# Patient Record
Sex: Female | Born: 1965 | Race: White | Hispanic: No | Marital: Married | Smoking: Never smoker
Health system: Southern US, Community
[De-identification: ages and names within clinical notes are randomized; demographics above are authoritative.]

## PROBLEM LIST (undated history)

## (undated) DIAGNOSIS — Z85828 Personal history of other malignant neoplasm of skin: Secondary | ICD-10-CM

## (undated) DIAGNOSIS — Z87442 Personal history of urinary calculi: Secondary | ICD-10-CM

## (undated) DIAGNOSIS — R011 Cardiac murmur, unspecified: Secondary | ICD-10-CM

## (undated) DIAGNOSIS — C50112 Malignant neoplasm of central portion of left female breast: Secondary | ICD-10-CM

## (undated) DIAGNOSIS — R112 Nausea with vomiting, unspecified: Secondary | ICD-10-CM

## (undated) DIAGNOSIS — B009 Herpesviral infection, unspecified: Secondary | ICD-10-CM

## (undated) DIAGNOSIS — M549 Dorsalgia, unspecified: Secondary | ICD-10-CM

## (undated) DIAGNOSIS — T4145XA Adverse effect of unspecified anesthetic, initial encounter: Secondary | ICD-10-CM

## (undated) DIAGNOSIS — Z923 Personal history of irradiation: Secondary | ICD-10-CM

## (undated) DIAGNOSIS — R109 Unspecified abdominal pain: Secondary | ICD-10-CM

## (undated) DIAGNOSIS — Z9889 Other specified postprocedural states: Secondary | ICD-10-CM

## (undated) DIAGNOSIS — B079 Viral wart, unspecified: Secondary | ICD-10-CM

## (undated) DIAGNOSIS — T8859XA Other complications of anesthesia, initial encounter: Secondary | ICD-10-CM

## (undated) HISTORY — PX: WISDOM TOOTH EXTRACTION: SHX21

## (undated) HISTORY — DX: Viral wart, unspecified: B07.9

## (undated) HISTORY — DX: Malignant neoplasm of central portion of left female breast: C50.112

## (undated) HISTORY — PX: TONSILLECTOMY AND ADENOIDECTOMY: SHX28

## (undated) HISTORY — DX: Herpesviral infection, unspecified: B00.9

## (undated) HISTORY — PX: DILATION AND CURETTAGE OF UTERUS: SHX78

## (undated) HISTORY — DX: Dorsalgia, unspecified: M54.9

## (undated) HISTORY — PX: BREAST LUMPECTOMY: SHX2

## (undated) HISTORY — DX: Unspecified abdominal pain: R10.9

## (undated) HISTORY — DX: Personal history of other malignant neoplasm of skin: Z85.828

---
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
## 2002-12-29 ENCOUNTER — Other Ambulatory Visit: Admission: RE | Admit: 2002-12-29 | Discharge: 2002-12-29 | Payer: Self-pay | Admitting: Obstetrics & Gynecology

## 2003-02-06 ENCOUNTER — Encounter: Payer: Self-pay | Admitting: Obstetrics & Gynecology

## 2003-02-06 ENCOUNTER — Encounter: Admission: RE | Admit: 2003-02-06 | Discharge: 2003-02-06 | Payer: Self-pay | Admitting: Obstetrics & Gynecology

## 2004-02-12 ENCOUNTER — Other Ambulatory Visit: Admission: RE | Admit: 2004-02-12 | Discharge: 2004-02-12 | Payer: Self-pay | Admitting: Obstetrics & Gynecology

## 2004-04-02 ENCOUNTER — Encounter: Admission: RE | Admit: 2004-04-02 | Discharge: 2004-04-02 | Payer: Self-pay | Admitting: Obstetrics & Gynecology

## 2005-07-16 ENCOUNTER — Encounter: Admission: RE | Admit: 2005-07-16 | Discharge: 2005-07-16 | Payer: Self-pay | Admitting: Obstetrics & Gynecology

## 2006-07-21 ENCOUNTER — Encounter: Admission: RE | Admit: 2006-07-21 | Discharge: 2006-07-21 | Payer: Self-pay | Admitting: Obstetrics & Gynecology

## 2006-08-18 ENCOUNTER — Ambulatory Visit: Payer: Self-pay | Admitting: Family Medicine

## 2007-11-23 ENCOUNTER — Encounter: Admission: RE | Admit: 2007-11-23 | Discharge: 2007-11-23 | Payer: Self-pay | Admitting: Obstetrics & Gynecology

## 2008-04-05 ENCOUNTER — Ambulatory Visit: Payer: Self-pay | Admitting: Family Medicine

## 2008-05-24 ENCOUNTER — Ambulatory Visit: Payer: Self-pay | Admitting: Family Medicine

## 2009-06-24 ENCOUNTER — Ambulatory Visit: Payer: Self-pay | Admitting: Family Medicine

## 2009-06-24 DIAGNOSIS — B079 Viral wart, unspecified: Secondary | ICD-10-CM | POA: Insufficient documentation

## 2010-05-27 ENCOUNTER — Ambulatory Visit: Payer: Self-pay | Admitting: Obstetrics & Gynecology

## 2010-06-10 ENCOUNTER — Ambulatory Visit: Payer: Self-pay | Admitting: Family Medicine

## 2010-06-10 DIAGNOSIS — J040 Acute laryngitis: Secondary | ICD-10-CM | POA: Insufficient documentation

## 2010-06-10 DIAGNOSIS — J209 Acute bronchitis, unspecified: Secondary | ICD-10-CM

## 2010-07-01 ENCOUNTER — Ambulatory Visit: Payer: Self-pay | Admitting: Family Medicine

## 2010-07-01 DIAGNOSIS — B009 Herpesviral infection, unspecified: Secondary | ICD-10-CM | POA: Insufficient documentation

## 2010-07-01 DIAGNOSIS — J020 Streptococcal pharyngitis: Secondary | ICD-10-CM

## 2010-07-02 ENCOUNTER — Encounter: Payer: Self-pay | Admitting: Family Medicine

## 2010-07-03 LAB — CONVERTED CEMR LAB
Chlamydia, DNA Probe: NEGATIVE
GC Probe Amp, Genital: NEGATIVE

## 2010-11-25 NOTE — Assessment & Plan Note (Signed)
Summary: vaginal rash/alc acute only   Vital Signs:  Patient profile:   45 year old female Height:      67 inches Weight:      127.25 pounds BMI:     20.00 Temp:     98.2 degrees F oral Pulse rate:   72 / minute Pulse rhythm:   regular BP sitting:   116 / 82  (left arm) Cuff size:   regular  Vitals Entered By: Lewanda Rife LPN (July 01, 2010 3:15 PM) CC: perineal rash ? blisters   History of Present Illness: was on vacation -- had a really sore throat -- and was dx with strep on sunday  put on steroid and abx   also developed rash on her bottom at the same time (before abx and prednisone)  was told it vaguely looked bacterial but was not sure  did not give anything for it  now is worse-- is now blistery   no known exp to herpes  does have a new sexual partner -- he has no hx of herpes  feels like it is all on the outside   has been really stressed out lately    throat is better -- (was dx with quick test )   Allergies (verified): No Known Drug Allergies  Past History:  Social History: Last updated: 06/24/2009 is a Psychologist, 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 Eyes:  Denies blurring and eye irritation. CV:  Denies chest pain or discomfort and palpitations. Resp:  Denies cough and wheezing. GI:  Denies indigestion. GU:  Denies discharge, dysuria, and urinary frequency. MS:  Denies joint pain, joint redness, and joint swelling. Derm:  Denies itching, lesion(s), poor wound healing, and rash. Neuro:  Denies numbness and tingling. Psych:  some stress. Endo:  Denies excessive thirst and excessive urination. Heme:  Denies abnormal bruising and bleeding.  Physical Exam  General:  Well-developed,well-nourished,in no acute distress; alert,appropriate and cooperative throughout examination Head:   normocephalic, atraumatic, and no abnormalities observed.   Eyes:  vision grossly intact, pupils equal, pupils round, pupils reactive to light, and no injection.   Mouth:  pharynx pink and moist.  -- very slt injection in throat post - no lesions or exudates Neck:  No deformities, masses, or tenderness noted. Lungs:  Normal respiratory effort, chest expands symmetrically. Lungs are clear to auscultation, no crackles or wheezes. Heart:  Normal rate and regular rhythm. S1 and S2 normal without gallop, murmur, click, rub or other extra sounds. Abdomen:  no suprapubic tenderness or fullness felt  Genitalia:  lesions noted- see skin exam no internal lesions no vaginal discharge and mucosa pink and moist.   Msk:  No deformity or scoliosis noted of thoracic or lumbar spine.  no acute joint changes Skin:  clusters of vesicles seen on labia L and also superiorly  Cervical Nodes:  No lymphadenopathy noted Inguinal Nodes:  No significant adenopathy Psych:  nl affect- seems stressed    Impression & Recommendations:  Problem # 1:  HSV (ICD-054.9) Assessment New new case with new sexual partner tx with valtrex viral probe sent from intact blister  also test for gon/ chlam counseled on this disease as well as other stds  choices given incl suppressive tx -- will wait on results  Orders: T-Chlamydia Probe, genital ([id number]) T-GC Probe,  genital [id number]) T- * Misc. Laboratory test [phone]) Specimen Handling ([id number])  Problem # 2:  STREP THROAT (ICD-034.0) Assessment: Improved improved after a course of abx  inst to finish all and update if not gone Her updated medication list for this problem includes:    Azithromycin 250 Mg Tabs (Azithromycin) .Marland Kitchen... 2 tab by mouth x 1 then 1 tab by mouth daily  Complete Medication List: 1)  Calcium Carbonate-vitamin D 600-400 Mg-unit Tabs (Calcium carbonate-vitamin d) .... Take 1 tablet by mouth once a day 2)  Multivitamins Tabs (Multiple vitamin)  .... Take 1 tablet by mouth once a day 3)  Vitamin C 500 Mg Tabs (Ascorbic acid) .... Take 1 tablet by mouth once a day 4)  Vitamin D 1000 Unit Tabs (Cholecalciferol) .... Take 1 tablet by mouth once a day 5)  Glucosamine 500 Mg Caps (Glucosamine sulfate) .... Take one by mouth daily 6)  Azithromycin 250 Mg Tabs (Azithromycin) .... 2 tab by mouth x 1 then 1 tab by mouth daily 7)  Prednisone ?mg  .... Graduated doses 8)  Amoxicillin ?mg  .... One tablet by mouth  twice a day for 10 days. 9)  Valtrex 1 Gm Tabs (Valacyclovir hcl) .Marland Kitchen.. 1 by mouth two times a day for 10 days  Patient Instructions: 1)  take the valtrex as directed  2)  we will update you with test results when they return  Prescriptions: VALTREX 1 GM TABS (VALACYCLOVIR HCL) 1 by mouth two times a day for 10 days  #20 x 0   Entered and Authorized by:   Judith Part MD   Signed by:   Judith Part MD on 07/01/2010   Method used:   [facility] to        [id number]       [address]       [place], [place]  [address]       Ph: [phone]       Fax: [phone]   RxID:   [id number]   Current Allergies (reviewed today): No known allergies

## 2010-11-25 NOTE — Assessment & Plan Note (Signed)
Summary: BRONCHITIS/DLO   Vital Signs:  Patient profile:   45 year old female Height:      67 inches Weight:      133.0 pounds BMI:     20.91 O2 Sat:      98 % on Room air Temp:     97.8 degrees F oral Pulse rate:   61 / minute Pulse rhythm:   regular Resp:     16 per minute BP sitting:   90 / 60  (left arm) Cuff size:   regular  Vitals Entered By: Benny Lennert CMA Duncan Dull) (June 10, 2010 9:24 AM)  O2 Flow:  Room air  History of Present Illness: Chief complaint bronchitis  Acute Visit History:      The patient complains of cough, fever, and sore throat.  These symptoms began 1 week ago.  She denies earache, headache, nasal discharge, and sinus problems.  Other comments include: fatgiue, body aches initially  lost voice, hoarse  [ain in chest with cough Not getting any better.  Using cough med , ibuprofen. Marland Kitchen        Her highest temperature has been last week, low grade.        The character of the cough is described as productive.  There is no history of wheezing, sleep interference, or shortness of breath associated with her cough.        Problems Prior to Update: 1)  Unspecified Viral Warts  (ICD-078.10)  Current Medications (verified): 1)  Calcium Carbonate-Vitamin D 600-400 Mg-Unit  Tabs (Calcium Carbonate-Vitamin D) .... Take 1 Tablet By Mouth Once A Day 2)  Multivitamins   Tabs (Multiple Vitamin) .... Take 1 Tablet By Mouth Once A Day 3)  Vitamin C 500 Mg  Tabs (Ascorbic Acid) .... Take 1 Tablet By Mouth Once A Day 4)  Vitamin D 1000 Unit  Tabs (Cholecalciferol) .... Take 1 Tablet By Mouth Once A Day 5)  Glucosamine 500 Mg Caps (Glucosamine Sulfate) .... Take One By Mouth Daily 6)  Azithromycin 250 Mg Tabs (Azithromycin) .... 2 Tab By Mouth X 1 Then 1 Tab By Mouth Daily  Allergies (verified): No Known Drug Allergies  Past History:  Past medical, surgical, family and social histories (including risk factors) reviewed, and no changes noted (except as noted  below).  Past Medical History: Reviewed history from 06/24/2009 and no changes required.    gyn - Dr Langston Masker   Family History: Reviewed history and no changes required.  Social History: Reviewed history from 06/24/2009 and no changes required. 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 CV:  Denies chest pain or discomfort. Resp:  Denies coughing up blood. GI:  Denies abdominal pain.  Physical Exam  General:  Well-developed,well-nourished,in no acute distress; alert,appropriate and cooperative throughout examination Whispering due to lost voice Ears:  External ear exam shows no significant lesions or deformities.  Otoscopic examination reveals clear canals, tympanic membranes are intact bilaterally without bulging, retraction, inflammation or discharge. Hearing is grossly normal bilaterally. Nose:  External nasal examination shows no deformity or inflammation. Nasal mucosa are pink and moist without lesions or exudates. Mouth:  no exudates and pharyngeal erythema.   Neck:  no carotid bruit or thyromegaly no cervical or supraclavicular lymphadenopathy  Lungs:  Normal respiratory effort, chest expands symmetrically. Lungs are clear to auscultation, no crackles or wheezes. Heart:  Normal rate and regular rhythm. S1 and S2 normal without  gallop, murmur, click, rub or other extra sounds.   Impression & Recommendations:  Problem # 1:  ACUTE BRONCHITIS (ICD-466.0) No clelar bacterial cause and pt low risk given no chronic issues and no smoking.  Most likely viral infection. Encouraged her ti given symptoms more time to turn the corner, mucolytic. If not improving as expected..she was given prescription of antibiotc to cover bacteriual source..given her personal past experience with bronchitis.  Her updated medication list for this problem includes:    Azithromycin 250 Mg Tabs (Azithromycin) .Marland Kitchen... 2 tab  by mouth x 1 then 1 tab by mouth daily  Problem # 2:  ACUTE LARYNGITIS, WITHOUT MENTION OF OBSTRUCTIO (ICD-464.00) Likely viral. Voice rest. Warm fluids.  Complete Medication List: 1)  Calcium Carbonate-vitamin D 600-400 Mg-unit Tabs (Calcium carbonate-vitamin d) .... Take 1 tablet by mouth once a day 2)  Multivitamins Tabs (Multiple vitamin) .... Take 1 tablet by mouth once a day 3)  Vitamin C 500 Mg Tabs (Ascorbic acid) .... Take 1 tablet by mouth once a day 4)  Vitamin D 1000 Unit Tabs (Cholecalciferol) .... Take 1 tablet by mouth once a day 5)  Glucosamine 500 Mg Caps (Glucosamine sulfate) .... Take one by mouth daily 6)  Azithromycin 250 Mg Tabs (Azithromycin) .... 2 tab by mouth x 1 then 1 tab by mouth daily  Patient Instructions: 1)  Mucinex DM twice daily..maximum strength guafenesin. 2)   Push fluids, voice rest and rest. 3)   hold off on intense physical activity. 4)   If not turning corner in 2-3 days may fill antibiotic. Prescriptions: AZITHROMYCIN 250 MG TABS (AZITHROMYCIN) 2 tab by mouth x 1 then 1 tab by mouth daily  #6 x 0   Entered and Authorized by:   Kerby Nora MD   Signed by:   Kerby Nora MD on 06/10/2010   Method used:   Print then Give to Patient   RxID:   [id number]   Current Allergies (reviewed today): No known allergies

## 2010-12-10 ENCOUNTER — Telehealth: Payer: Self-pay | Admitting: Family Medicine

## 2010-12-17 NOTE — Progress Notes (Signed)
Summary: refill request for valtrex  Phone Note Refill Request Call back at Home Phone [phone] Message from:  Patient  Refills Requested: Medication #1:  VALTREX 1 GM TABS 1 by mouth two times a day for 10 days. Phoned request from pt, please send to [facility].  Initial call taken by: Lowella Petties CMA, AAMA,  December 10, 2010 9:20 AM  Follow-up for Phone Call        px written on EMR for call in  Follow-up by: Judith Part MD,  December 10, 2010 1:39 PM  Additional Follow-up for Phone Call Additional follow up Details #1::        Med [facility] to [facility] as instructed.Patient notified as instructed by telephone. Lewanda Rife LPN  December 10, 2010 2:11 PM     Prescriptions: VALTREX 1 GM TABS (VALACYCLOVIR HCL) 1 by mouth two times a day for 10 days  #20 x 1   Entered by:   Lewanda Rife LPN   Authorized by:   Judith Part MD   Signed by:   Lewanda Rife LPN on 29/52/8413   Method used:   Electronically to        [id number]       [address]       [place], [place]  [address]       Ph: [phone]       Fax: [phone]   RxID:   [id number] VALTREX 1 GM TABS (VALACYCLOVIR HCL) 1 by mouth two times a day for 10 days  #20 x 1   Entered and Authorized by:   Judith Part MD   Signed by:   Judith Part MD on 12/10/2010   Method used:   Telephoned to ...         RxID:   [id number]

## 2011-01-21 ENCOUNTER — Encounter: Payer: Self-pay | Admitting: Family Medicine

## 2011-01-21 ENCOUNTER — Ambulatory Visit (INDEPENDENT_AMBULATORY_CARE_PROVIDER_SITE_OTHER)
Admission: RE | Admit: 2011-01-21 | Discharge: 2011-01-21 | Disposition: A | Discharge status: Home / Self Care | Payer: BC Managed Care – PPO | Source: Ambulatory Visit | Attending: Family Medicine | Admitting: Family Medicine

## 2011-01-21 ENCOUNTER — Ambulatory Visit (INDEPENDENT_AMBULATORY_CARE_PROVIDER_SITE_OTHER): Payer: BC Managed Care – PPO | Admitting: Family Medicine

## 2011-01-21 VITALS — BP 122/74 | HR 64 | Temp 98.1°F | Wt 134.0 lb

## 2011-01-21 DIAGNOSIS — R109 Unspecified abdominal pain: Secondary | ICD-10-CM

## 2011-01-21 DIAGNOSIS — M549 Dorsalgia, unspecified: Secondary | ICD-10-CM | POA: Insufficient documentation

## 2011-01-21 HISTORY — DX: Unspecified abdominal pain: R10.9

## 2011-01-21 HISTORY — DX: Dorsalgia, unspecified: M54.9

## 2011-01-21 LAB — POCT URINALYSIS DIPSTICK
Bilirubin, UA: NEGATIVE
Ketones, UA: NEGATIVE
Leukocytes, UA: NEGATIVE
Nitrite, UA: NEGATIVE
Protein, UA: NEGATIVE

## 2011-01-21 MED ORDER — HYDROCODONE-ACETAMINOPHEN 5-500 MG PO TABS: 1.0000 | ORAL_TABLET | Freq: Four times a day (QID) | ORAL | Status: AC | PRN

## 2011-01-21 MED ORDER — TAMSULOSIN HCL 0.4 MG PO CAPS: 0.4000 mg | ORAL_CAPSULE | Freq: Every day | ORAL | Status: AC

## 2011-01-21 MED ORDER — NAPROXEN 500 MG PO TABS: 500.0000 mg | ORAL_TABLET | Freq: Two times a day (BID) | ORAL | Status: AC

## 2011-01-21 NOTE — Progress Notes (Signed)
  Subjective:    Patient ID: Holly Bennett, female    DOB: 08-18-1966, 45 y.o.   MRN: [id number] CC: R side pain  HPI 3d h/o R side pain.  Starts R flank, travels along obliques into R abdomen.  + nausea.  + chills.  Pain described as tightness and dull achey can't get comfortable.  Constant.  No radiation into groin or up into chest.  Ice improves pain.  Ibuprofen helps some.  Heat makes pain worse.  No new foods.  No new meds.    No dysuria, polyuria, urgency.  No blood in urine. No fever.  No vomiting, diarrhea, constipation, urinary changes.  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  H/o appendectomy.    Review of Systems Per HPI    Objective:   Physical Exam  Vitals reviewed. Constitutional: She appears well-developed and well-nourished. No distress.  HENT:  Head: Normocephalic.  Mouth/Throat: Oropharynx is clear and moist. No oropharyngeal exudate.  Eyes: Pupils are equal, round, and reactive to light.  Cardiovascular: Normal rate, regular rhythm and intact distal pulses.   No murmur heard. Pulmonary/Chest: Effort normal and breath sounds normal. No respiratory distress. She has no wheezes. She has no rales.  Abdominal: Soft. Normal appearance and bowel sounds are normal. She exhibits no distension and no mass. There is tenderness in the right lower quadrant. There is CVA tenderness. There is no rigidity, no rebound and negative Murphy's sign.       + R CVA tenderness.  + R paraspinous mm tenderness.  No midline spine tenderness.  Skin: Skin is warm and dry. No rash noted.          Assessment & Plan:

## 2011-01-21 NOTE — Progress Notes (Signed)
Patient notified

## 2011-01-21 NOTE — Assessment & Plan Note (Addendum)
With hematuria, not on period, not pregnant. Anticipate  kidney stone.   Treat as such.  Provided with flomax, NSAID, vicodin for breakthrough pain. Provided with strainer. KUB today, no evidence of stone.  If not resolving as expected, consider CT scan to further evaluate.

## 2011-01-21 NOTE — Patient Instructions (Addendum)
Looks like kidney stone. Treat with flomax daily for 7 days and naprosyn (anti inflammatory) daily for next week.  Vicodin for breakthrough pain. Xray today - i'm not seeing obvious stone. If pain not improving as expected or getting worse, let us know. Urine sent for culture regardless.

## 2011-01-21 NOTE — Assessment & Plan Note (Signed)
See above

## 2011-01-23 ENCOUNTER — Telehealth: Payer: Self-pay | Admitting: Family Medicine

## 2011-01-23 LAB — URINE CULTURE: Colony Count: NO GROWTH

## 2011-01-23 NOTE — Telephone Encounter (Signed)
Patient notified and verbalized understanding. 

## 2011-01-23 NOTE — Telephone Encounter (Signed)
Message copied by Eustaquio Boyden on Fri Jan 23, 2011  2:36 PM ------      Message from: Josph Macho      Created: Fri Jan 23, 2011  1:45 PM       Spoke with patient. She says she is 80% better. She has been straining urine and hasn't caught anything though. She is still tender to her back and flank area. She is still taking the anti-inflammatories and wondered if she needs to continue the others. I told her she didn't need the vicodin unless she was in severe pain. I told her I would let her know about the other.

## 2011-01-23 NOTE — Telephone Encounter (Signed)
Continue other 2 meds through next week.  If still pain, update [place] next week.  If worsening over weekend, or any fevers, to go to [facility].

## 2011-01-23 NOTE — Progress Notes (Signed)
Spoke with patient and notified her of results. She says she is still tender to her back and flank area, but overall she says she is about 80% better. She has been straining urine and hasn't caught anything. She is still taking the antiinflammatories. Should she continue those as well as the other medicine you put her on? I told her if she didn't need the vicodin, then not to take that.

## 2011-02-16 ENCOUNTER — Telehealth: Payer: Self-pay | Admitting: *Deleted

## 2011-02-16 ENCOUNTER — Other Ambulatory Visit: Payer: Self-pay | Admitting: Family Medicine

## 2011-02-16 ENCOUNTER — Ambulatory Visit: Payer: Self-pay | Admitting: Family Medicine

## 2011-02-16 DIAGNOSIS — R109 Unspecified abdominal pain: Secondary | ICD-10-CM

## 2011-02-16 NOTE — Telephone Encounter (Signed)
May set up with CT scan.  Today if able.  Pt prefers [place].

## 2011-02-16 NOTE — Telephone Encounter (Signed)
Triage Record Num: [id number] Operator: Amy Head Patient Name: Holly Bennett Call Date & Time: 02/15/2011 7:49:51PM Patient Phone: [phone] PCP: Audrie Gallus. Tower Patient Gender: Female PCP Fax : Patient DOB: 04-10-1966 Practice Name: [facility] Reason for Call: Pt/[name] calling and states that she was seen in office 3 weeks ago and dx with Kidney stones. Was given pain med, Flomax, and an anti imflammatory med (unsure of names). Pain and sxs went away and came back 02/10/11 and have gotten progressively worse. There is a constant dull pain, that changes to sharp pain with numbness to right lower back radiating to the right oblique area. Blood noted in urine. Advised ED per guidelines to [facility] ED. States that she will go to [facility]. Protocol(s) Used: Flank Pain Recommended Outcome per Protocol: See Provider within 4 hours Reason for Outcome: Flank pain or low back pain AND urinary tract symptoms Care Advice: ~ Another adult should drive. ~ Call provider if symptoms worsen or new symptoms develop. ~ Tell provider medical history of renal disease; especially if have only one kidney. Increase intake of fluids. Try to drink 8 oz. (.2 liter) every hour when awake, including unsweetened cranberry juice, unless on restricted fluids for other medical reasons. Take sips of fluid or eat ice chips if nauseated or vomiting. ~ ~ SYMPTOM / CONDITION MANAGEMENT ~ CAUTIONS ~ List, or take, all current prescription(s), nonprescription or alternative medication(s) to provider for evaluation. Limit carbonated, alcoholic, and caffeinated beverages such as coffee, tea and soda. Avoid nonprescription cold and allergy medications that contain caffeine. Limit intake of tomatoes, fruit juices (except for unsweetened cranberry juice), dairy products, spicy foods, sugar, and artificial sweeteners (aspartame or saccharine). Stop or decrease smoking. Reducing exposure to bladder irritants may  help lessen urgency. ~ Analgesic/Antipyretic Advice - Acetaminophen: Consider acetaminophen as directed on label or by pharmacist/provider for pain or fever PRECAUTIONS: - Use if there is no history of liver disease, alcoholism, or intake of three or more alcohol drinks per day - Only if approved by provider during pregnancy or when breastfeeding - During pregnancy, acetaminophen should not be taken more than 3 consecutive days without telling provider - Do not exceed recommended dose or frequency ~ Analgesic/Antipyretic Advice - NSAIDs: Consider aspirin, ibuprofen, naproxen or ketoprofen for pain or fever as directed on label or by pharmacist/provider. PRECAUTIONS: - If over 58 years of age, should not take longer than 1 week without consulting provider. EXCEPTIONS: - Should not be used if taking blood thinners or have bleeding problems. - Do not use if have history of sensitivity/allergy to any of these medications; or history of cardiovascular, ulcer, kidney, liver disease or diabetes unless approved by provider. - Do not exceed recommended dose or frequency. ~ 04/

## 2011-02-16 NOTE — Telephone Encounter (Signed)
Spoke to patient and was informed that she is having a CT scan done now.

## 2011-02-16 NOTE — Telephone Encounter (Signed)
Pt continues to have right side pain, has blood in her urine.  She was seen for this problem on 3/28.  This pain went away for awhile but started coming back last week.  She hasnt passed a stone that she can tell.  She wants to have CT done to evaluate, wants to go to [place]

## 2011-02-17 ENCOUNTER — Telehealth: Payer: Self-pay | Admitting: Family Medicine

## 2011-02-17 DIAGNOSIS — R109 Unspecified abdominal pain: Secondary | ICD-10-CM

## 2011-02-17 NOTE — Telephone Encounter (Signed)
Discussed prelim results from CT scan w/out contrast (received call from radiology yest).  No kidney stone.  + gallbladder contracted but there was food in stomach so could be causing that.  No evidence of gallstones.  Pt states a few days ago did have episode of R flank pain radiating to obliques, and now feels sore there.  Also with blood in urine a few days ago, now resolving.  Discussed I'd like to set her up with abd [place] to eval gallbladder, then return for office visit to check labs/recheck urine.  Will place order in chart.

## 2011-02-18 ENCOUNTER — Telehealth: Payer: Self-pay | Admitting: Family Medicine

## 2011-02-18 ENCOUNTER — Ambulatory Visit: Payer: Self-pay | Admitting: Family Medicine

## 2011-02-18 DIAGNOSIS — R109 Unspecified abdominal pain: Secondary | ICD-10-CM

## 2011-02-18 NOTE — Telephone Encounter (Signed)
US abdomen with possible minimal hydronephrosis R side, no stones.  No gallstones or gallbladder abnormality. Called and discussed results with patient.   She states had gross hematuria and R flank pain again over weekend.  Now feeling better. Will refer to urology for hematuria and flank pain eval, in setting of negative UCx, noncontrast CT with no stones and abd [place] with possible minimal R hydro. All imaging done at [facility], have asked to scan into system.

## 2011-02-23 ENCOUNTER — Encounter: Payer: Self-pay | Admitting: Family Medicine

## 2011-03-13 NOTE — Assessment & Plan Note (Signed)
[facility]                             [place] OFFICE NOTE   ANASTYN, AYARS                        MRN:          [id number]  DATE:08/18/2006                            DOB:          02-08-1966    Mrs. Holly Bennett is a 45 year old white female who comes to establish with the  practice due to upper respiratory symptoms and a fever of 101 degrees. She  indicates onset x several days. She has developed laryngitis over the last  two days. She has been using her daughter's cough syrup with codeine, which  makes her sleepy. Her 72-year-old has also had a cough for approximately  three to four weeks and is being seen by the pediatrician.   CURRENT MEDICATIONS:  Ultra Mega Multivitamin.   ALLERGIES:  None known.   PAST MEDICAL HISTORY:  She indicates that she had measles, mumps and  chickenpox as a child while living in [place].   PAST SURGICAL HISTORY:  Have included appendectomy at age 60, 74 D and C's  and extraction of wisdom teeth. She has been hospitalized for child-birthing  surgery only.   SOCIAL HISTORY:  She is married and has two daughters, ages 70 and 67. 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 She has never smoked and uses alcohol on  occasion.   REVIEW OF SYSTEMS:  Negative, including HEENT, cardiovascular and  respiratory. Gynecologically, she is a gravida 2, para 4, having had two  miscarriages. She sees Passenger transport manager for her gynecological care and is up  to date with her Pap smear. She had a mammogram in September 2007, which was  negative.   FAMILY HISTORY:  Father is living at age 62 with hypertension and is smoker.  Mother is living at 78 with post-menopausal breast cancer. She has one  brother living and well.   For questions regarding the extended family, find that there is  cardiovascular disease on both sides of the family. No diabetes, cancer, or  drug or alcohol abuse.   IMMUNIZATIONS:  Her last tetanus was greater than ten years ago. She has not  had the Hepatitis-B vaccine.   PHYSICAL EXAMINATION:  VITAL SIGNS: Blood pressure 104/61, temperature 98.2,  pulse 75, weight 130, height 64 inches.  GENERAL: Well developed, well nourished, white female in no acute distress.  However, she does whisper.  HEENT: TMs retracted bilaterally with fluid present. Nasal mucosa is  erythematous and edematous. Posterior pharynx is injected, no exudate.  Tonsils are not enlarged. Sinuses are not tender. She has shotty  lymphadenopathy. No odor of strep.  CHEST: Clear throughout, no rales, rhonchi or wheezes.  HEART: Regular rate and rhythm without murmurs, gallops or rubs. No carotid  bruits, no pre-tibial edema.  MUSCULOSKELETAL: Muscle mass is equal throughout upper and lower  extremities. There is no obvious bulking.  SKIN: Without lesions in the exposed area.  PSYCHIATRIC: Oriented x 3, verbalizes easily and is very pleasant.   LABORATORY DATA:  Rapid Strep is negative.   ASSESSMENT:  Pharyngitis/laryngitis, which is probably viral. We had a long  discussion regarding comfort measures, including soft foods and liquids, to  include protein, gargling frequently, ibuprofen prn and call if not improved  in 5-7 days.   HEALTH MAINTENANCE:  She is need of her tetanus. We will get that at the  next visit.     ______________________________  Atha Starks. Bean, FNP    ______________________________  Arta Silence, MD   BDB/MedQ  DD: 08/19/2006  DT: 08/20/2006  Job #: [id number]

## 2011-07-21 ENCOUNTER — Ambulatory Visit: Payer: Self-pay | Admitting: Obstetrics & Gynecology

## 2011-07-24 ENCOUNTER — Other Ambulatory Visit: Payer: Self-pay | Admitting: *Deleted

## 2011-07-24 MED ORDER — VALACYCLOVIR HCL 500 MG PO TABS: 500.0000 mg | ORAL_TABLET | Freq: Two times a day (BID) | ORAL | Status: DC

## 2011-07-24 NOTE — Telephone Encounter (Signed)
OK to refill

## 2011-07-24 NOTE — Telephone Encounter (Signed)
Please ask what she is taking it for -- cold sore on mouth or genital - guidelines have changed for frequency etc Then I can refil- thanks

## 2011-07-24 NOTE — Telephone Encounter (Signed)
Gential area

## 2011-07-24 NOTE — Telephone Encounter (Signed)
Medication phoned to [facility] pharmacy as instructed. Patient notified as instructed by telephone.

## 2011-07-24 NOTE — Telephone Encounter (Signed)
Thanks - recommended tx is now 500 mg twice daily for 3 days Update if any problems  Px written for call in

## 2012-08-30 ENCOUNTER — Ambulatory Visit: Payer: Self-pay | Admitting: Obstetrics & Gynecology

## 2013-03-23 ENCOUNTER — Other Ambulatory Visit: Payer: Self-pay | Admitting: *Deleted

## 2013-03-23 MED ORDER — VALACYCLOVIR HCL 500 MG PO TABS: 500.0000 mg | ORAL_TABLET | Freq: Two times a day (BID) | ORAL | Status: AC

## 2013-08-31 ENCOUNTER — Ambulatory Visit: Payer: Self-pay | Admitting: Obstetrics & Gynecology

## 2016-10-26 HISTORY — PX: MOHS SURGERY: SUR867

## 2017-02-08 DIAGNOSIS — C4491 Basal cell carcinoma of skin, unspecified: Secondary | ICD-10-CM

## 2017-02-08 HISTORY — DX: Basal cell carcinoma of skin, unspecified: C44.91

## 2017-06-10 DIAGNOSIS — Z85828 Personal history of other malignant neoplasm of skin: Secondary | ICD-10-CM | POA: Insufficient documentation

## 2017-06-10 HISTORY — DX: Personal history of other malignant neoplasm of skin: Z85.828

## 2017-09-07 ENCOUNTER — Other Ambulatory Visit: Payer: Self-pay | Admitting: Obstetrics & Gynecology

## 2017-09-07 DIAGNOSIS — Z1231 Encounter for screening mammogram for malignant neoplasm of breast: Secondary | ICD-10-CM

## 2017-09-29 ENCOUNTER — Ambulatory Visit
Admission: RE | Admit: 2017-09-29 | Discharge: 2017-09-29 | Disposition: A | Discharge status: Home / Self Care | Payer: 59 | Source: Ambulatory Visit | Attending: Obstetrics & Gynecology | Admitting: Obstetrics & Gynecology

## 2017-09-29 DIAGNOSIS — Z1231 Encounter for screening mammogram for malignant neoplasm of breast: Secondary | ICD-10-CM

## 2017-10-01 ENCOUNTER — Other Ambulatory Visit: Payer: Self-pay | Admitting: Obstetrics & Gynecology

## 2017-10-05 ENCOUNTER — Other Ambulatory Visit: Payer: Self-pay | Admitting: *Deleted

## 2017-10-05 ENCOUNTER — Inpatient Hospital Stay
Admission: RE | Admit: 2017-10-05 | Discharge: 2017-10-05 | Disposition: A | Discharge status: Home / Self Care | Payer: Self-pay | Source: Ambulatory Visit | Attending: *Deleted | Admitting: *Deleted

## 2017-10-05 ENCOUNTER — Other Ambulatory Visit: Payer: Self-pay | Admitting: Obstetrics & Gynecology

## 2017-10-05 DIAGNOSIS — Z9289 Personal history of other medical treatment: Secondary | ICD-10-CM

## 2017-10-05 DIAGNOSIS — R928 Other abnormal and inconclusive findings on diagnostic imaging of breast: Secondary | ICD-10-CM

## 2017-10-05 DIAGNOSIS — N6320 Unspecified lump in the left breast, unspecified quadrant: Secondary | ICD-10-CM

## 2017-10-11 ENCOUNTER — Ambulatory Visit
Admission: RE | Admit: 2017-10-11 | Discharge: 2017-10-11 | Disposition: A | Discharge status: Home / Self Care | Payer: 59 | Source: Ambulatory Visit | Attending: Obstetrics & Gynecology | Admitting: Obstetrics & Gynecology

## 2017-10-11 ENCOUNTER — Other Ambulatory Visit: Payer: Self-pay | Admitting: Obstetrics & Gynecology

## 2017-10-11 DIAGNOSIS — N6489 Other specified disorders of breast: Secondary | ICD-10-CM | POA: Insufficient documentation

## 2017-10-11 DIAGNOSIS — N6320 Unspecified lump in the left breast, unspecified quadrant: Secondary | ICD-10-CM

## 2017-10-11 DIAGNOSIS — R928 Other abnormal and inconclusive findings on diagnostic imaging of breast: Secondary | ICD-10-CM | POA: Insufficient documentation

## 2017-10-13 ENCOUNTER — Other Ambulatory Visit: Payer: Self-pay | Admitting: Obstetrics & Gynecology

## 2017-10-13 ENCOUNTER — Telehealth: Payer: Self-pay | Admitting: *Deleted

## 2017-10-13 NOTE — Telephone Encounter (Signed)
Patient called stating [facility] has placed orders in epic that need signed for breast bx. I send Dr.Lavoie a staff message to please sign orders so pt can be scheduled.

## 2017-10-14 NOTE — Telephone Encounter (Signed)
Paper order was signed and faxed. Pt will be schedule.

## 2017-10-15 ENCOUNTER — Ambulatory Visit
Admission: RE | Admit: 2017-10-15 | Discharge: 2017-10-15 | Disposition: A | Discharge status: Home / Self Care | Payer: 59 | Source: Ambulatory Visit | Attending: Obstetrics & Gynecology | Admitting: Obstetrics & Gynecology

## 2017-10-15 DIAGNOSIS — D0592 Unspecified type of carcinoma in situ of left breast: Secondary | ICD-10-CM | POA: Insufficient documentation

## 2017-10-15 DIAGNOSIS — R928 Other abnormal and inconclusive findings on diagnostic imaging of breast: Secondary | ICD-10-CM

## 2017-10-15 HISTORY — PX: BREAST BIOPSY: SHX20

## 2017-10-20 ENCOUNTER — Ambulatory Visit (INDEPENDENT_AMBULATORY_CARE_PROVIDER_SITE_OTHER): Payer: 59 | Admitting: Obstetrics & Gynecology

## 2017-10-20 ENCOUNTER — Encounter: Payer: Self-pay | Admitting: Obstetrics & Gynecology

## 2017-10-20 ENCOUNTER — Telehealth: Payer: Self-pay | Admitting: *Deleted

## 2017-10-20 VITALS — BP 142/92 | Ht 64.5 in | Wt 151.0 lb

## 2017-10-20 DIAGNOSIS — C50112 Malignant neoplasm of central portion of left female breast: Secondary | ICD-10-CM

## 2017-10-20 DIAGNOSIS — Z3009 Encounter for other general counseling and advice on contraception: Secondary | ICD-10-CM | POA: Diagnosis not present

## 2017-10-20 DIAGNOSIS — N9089 Other specified noninflammatory disorders of vulva and perineum: Secondary | ICD-10-CM

## 2017-10-20 DIAGNOSIS — Z01419 Encounter for gynecological examination (general) (routine) without abnormal findings: Secondary | ICD-10-CM | POA: Diagnosis not present

## 2017-10-20 NOTE — Telephone Encounter (Signed)
Joann called from [facility] to ask if patient was aware of new breast cancer diagnosis, as she had annual today with Dr.Lavoie. Patient had breast Bx done on 10/15/17. I told Joann per [facility] office note patient was aware and the radiologist should proceed with referral.Joann said the will get everything worked out for the patient.

## 2017-10-20 NOTE — Progress Notes (Signed)
[facility] 1966/03/29 [id number]   History:    51 y.o. G4P2A2L2 Married.  2 girls, 2 and 51 yo.  RP:  Established patient presenting  for annual gyn exam   HPI: On Junel 1/20 for contraception.  Normal light menses every month.  No pelvic pain.  Normal vaginal secretions.  Patient reports that 1 of her small vulvar hemangiomas has enlarged slightly recently.  Urine/BMs wnl.  Good nutrition and fit.  Health labs with family physician.  No screening colonoscopy done yet.      Patient had an abnormal screening mammogram December 2018.  A left diagnostic mammogram with left breast ultrasound was done.  A core biopsy of the left breast at 12:00 was performed on October 15, 2017.  The patient did not know the results today, but the pathology results were in the chart and therefore the information was given and discussed with the patient:  Left Breast Pathology report:  - INVASIVE MAMMARY CARCINOMA, NO SPECIAL TYPE.   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  Gynecologic History Patient's last menstrual period was 10/20/2017. Contraception: OCP (estrogen/progesterone) Last Pap: 2017. Results were: normal Last mammogram: As above. Colonoscopy to be scheduled  Obstetric History OB History  Gravida Para Term Preterm AB Living  4 2     2 2   SAB TAB Ectopic Multiple Live Births  2            # Outcome Date GA Lbr Len/2nd Weight Sex Delivery Anes PTL Lv  4 SAB           3 SAB           2 Para           1 Para                ROS: A  ROS was performed and pertinent positives and negatives are included in the history.  GENERAL: No fevers or chills. HEENT: No change in vision, no earache, sore throat or sinus congestion. NECK: No pain or stiffness. CARDIOVASCULAR: No chest pain or pressure. No palpitations. PULMONARY: No shortness of breath, cough or wheeze. GASTROINTESTINAL: No abdominal pain, nausea, vomiting or diarrhea, melena or bright red blood per rectum. GENITOURINARY: No urinary frequency, urgency, hesitancy or dysuria. MUSCULOSKELETAL: No joint or muscle pain, no back pain, no recent trauma. DERMATOLOGIC: No rash, no itching, no lesions. ENDOCRINE: No polyuria, polydipsia, no heat or cold intolerance. No recent change in weight. HEMATOLOGICAL: No anemia or easy bruising or bleeding. NEUROLOGIC: No headache, seizures, numbness, tingling or weakness. PSYCHIATRIC: No depression, no loss of interest in normal activity or change in sleep pattern.     Exam:   BP (!) 142/92   Ht 5' 4.5" (1.638 m)   Wt 151 lb (68.5 kg)   LMP 10/20/2017 Comment: JUNEL  BMI 25.52 kg/m   Body mass index is 25.52 kg/m.  General appearance : Well developed well nourished female. No acute distress HEENT: Eyes: no retinal hemorrhage or exudates,  Neck supple, trachea midline,  no carotid bruits, no thyroidmegaly Lungs: Clear to auscultation, no rhonchi or wheezes, or rib retractions  Heart: Regular rate and rhythm, no murmurs or gallops Breast:Examined in sitting and supine position were symmetrical in appearance, no palpable masses or tenderness,  no skin retraction, no nipple inversion, no nipple discharge, no skin discoloration, no axillary or supraclavicular lymphadenopathy.  Left breast ecchymosis at 12 O'clock from Core Bx on 10/15/2017. Abdomen: no palpable masses or tenderness, no rebound or guarding Extremities: no edema or skin discoloration or tenderness  Pelvic: Vulva with small vulvar hemangiomas bilaterally.  1 of them is slightly  larger on the right side, round and regular.  Bartholin, Urethra, Skene Glands: Within normal limits             Vagina: No gross lesions or discharge  Cervix: No gross lesions or discharge.  Pap reflex done.  Uterus  AV, normal size, shape and consistency, non-tender and mobile  Adnexa  Without masses or tenderness  Anus and perineum  normal    Assessment/Plan:  51 y.o. female for annual exam   1. Encounter for routine gynecological examination with Papanicolaou smear of cervix Normal gynecologic exam except for diagnosis of left breast cancer, and small hemangiomas bilaterally on the vulva.  Health labs with family physician.  Will call back to organize screening colonoscopy after managing the left breast cancer.  2. Encounter for other general counseling or advice on contraception Given the diagnosis of left breast cancer, decision to stop birth control pills.  Will use condoms at this time.  ParaGard IUD discussed and will organize insertion when ready.  3. 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  Patient informed of the diagnosis and pathology reports reviewed with patient.  Given her diagnosis of breast cancer before menopause and a history of breast cancer in her mother, I recommended for the patient to undergo genetic testing.  Dr. Rosalita Levan, who performed a left breast biopsy, will call patient and organize referral to general surgeon for management.  4. Vulvar lesion Right vulvar hemangioma slightly enlarged.  Patient will organize a follow-up visit with me, after taking care of the breast cancer, for an excision of the lesion.   Princess Bruins MD, 2:49 PM 10/20/2017

## 2017-10-20 NOTE — Patient Instructions (Signed)
1. Encounter for routine gynecological examination with Papanicolaou smear of cervix Normal gynecologic exam except for diagnosis of left breast cancer, and small hemangiomas bilaterally on the vulva.  Health labs with family physician.  Will call back to organize screening colonoscopy after managing the left breast cancer.  2. Encounter for other general counseling or advice on contraception Given the diagnosis of left breast cancer, decision to stop birth control pills.  Will use condoms at this time.  ParaGard IUD discussed and will organize insertion when ready.  3. 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  Patient informed of the diagnosis and pathology reports reviewed with patient.  Given her diagnosis of breast cancer before menopause and a history of breast cancer in her mother, I recommended for the patient to undergo genetic testing.  Dr. Rosalita Levan, who performed a left breast biopsy, will call patient and organize referral to general surgeon for management.  4. Vulvar lesion Right vulvar hemangioma slightly enlarged.  Patient will organize a follow-up visit with me, after taking care of the breast cancer, for an excision of the lesion.   Dakiya, I am deeply sorry I had to give you those difficult news today.  I have known you for a long time and I know you will find the strength to fight this disease with all you've got!  You are well surrounded with your close ones, but let me know if you need additional support.  Please make sure you continue to eat well with plenty of vegetables and multivitamins and get all the rest you need.   Intrauterine Device Information An intrauterine device (IUD) is inserted into your uterus to prevent pregnancy. There are two types of IUDs available:  Copper IUD-This type of IUD is wrapped in copper  wire and is placed inside the uterus. Copper makes the uterus and fallopian tubes produce a fluid that kills sperm. The copper IUD can stay in place for 10 years.  Hormone IUD-This type of IUD contains the hormone progestin (synthetic progesterone). The hormone thickens the cervical mucus and prevents sperm from entering the uterus. It also thins the uterine lining to prevent implantation of a fertilized egg. The hormone can weaken or kill the sperm that get into the uterus. One type of hormone IUD can stay in place for 5 years, and another type can stay in place for 3 years.  Your health care provider will make sure you are a good candidate for a contraceptive IUD. Discuss with your health care provider the possible side effects. Advantages of an intrauterine device  IUDs are highly effective, reversible, long acting, and low maintenance.  There are no estrogen-related side effects.  An IUD can be used when breastfeeding.  IUDs are not associated with weight gain.  The copper IUD works immediately after insertion.  The hormone IUD works right away if inserted within 7 days of your period starting. You will need to use a backup method of birth control for 7 days if the hormone IUD is inserted at any other time in your cycle.  The copper IUD does not interfere with your female hormones.  The hormone IUD can make heavy menstrual periods lighter and decrease cramping.  The hormone IUD can be used for 3 or 5 years.  The copper IUD can be used for 10 years. Disadvantages of an intrauterine device  The hormone IUD  can be associated with irregular bleeding patterns.  The copper IUD can make your menstrual flow heavier and more painful.  You may experience cramping and vaginal bleeding after insertion. This information is not intended to replace advice given to you by your health care provider. Make sure you discuss any questions you have with your health care provider. Document Released:  09/15/2004 Document Revised: 03/19/2016 Document Reviewed: 04/02/2013 Elsevier Interactive Patient Education  2017 [employer].

## 2017-10-20 NOTE — Addendum Note (Signed)
Addended by: Thurnell Garbe A on: 10/20/2017 04:57 PM   Modules accepted: Orders

## 2017-10-21 ENCOUNTER — Other Ambulatory Visit: Payer: Self-pay

## 2017-10-21 DIAGNOSIS — C50919 Malignant neoplasm of unspecified site of unspecified female breast: Secondary | ICD-10-CM

## 2017-10-21 NOTE — Progress Notes (Signed)
  Oncology Nurse Navigator Documentation  Navigator Location: [facility] (10/21/17 1400)   )Navigator Encounter Type: Introductory phone call (10/21/17 1400)   Abnormal Finding Date: 10/11/17 (10/21/17 1400) Confirmed Diagnosis Date: 10/15/17 (10/21/17 1400)               Patient Visit Type: Initial (10/21/17 1400)   Barriers/Navigation Needs: Coordination of Care;Education (10/21/17 1400) Education: Accessing Care/ Finding Providers;Understanding Cancer/ Treatment Options;Coping with Diagnosis/ Prognosis;Newly Diagnosed Cancer Education (10/21/17 1400)                        Time Spent with Patient: 60 (10/21/17 1400)   Introduced IT trainer.  Scheduled MEd/Onc with Dr. Grayland Ormond 10/25/17 at 1:30, and Dr. Hampton Abbot 11/03/17 at 1:45. Stated her mother diagnosed with breast cancer at 56.  Patient interested in genetic testing.   Patient is a Physiological scientist, and prefers afternoon appointments.

## 2017-10-23 DIAGNOSIS — Z170 Estrogen receptor positive status [ER+]: Secondary | ICD-10-CM

## 2017-10-23 DIAGNOSIS — C50112 Malignant neoplasm of central portion of left female breast: Secondary | ICD-10-CM

## 2017-10-23 DIAGNOSIS — C50912 Malignant neoplasm of unspecified site of left female breast: Secondary | ICD-10-CM | POA: Insufficient documentation

## 2017-10-23 HISTORY — DX: Malignant neoplasm of central portion of left female breast: C50.112

## 2017-10-23 NOTE — Progress Notes (Signed)
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  INTERVAL HISTORY: Patient is a 51 year old female who was noted to have an abnormality on routine yearly screening mammogram.  Subsequent ultrasound biopsy revealed the above-stated breast cancer.  Currently, she is anxious but otherwise feels well.  She has no neurologic complaints.  She denies any recent fevers or illnesses.  She has a good appetite and denies weight loss.  She denies any pain.  She has no chest pain or shortness of breath.  She denies any nausea, vomiting, constipation, or diarrhea.  She has no urinary complaints.  Patient feels at her baseline offers no further specific complaints today.  REVIEW OF SYSTEMS:   Review of Systems  Constitutional: Negative.  Negative for fever, malaise/fatigue and weight loss.  Respiratory: Negative.  Negative for cough and shortness of breath.   Cardiovascular: Negative.  Negative for chest pain and leg swelling.  Gastrointestinal: Negative.  Negative for abdominal pain.  Genitourinary: Negative.   Musculoskeletal: Negative.   Skin: Negative.  Negative for rash.  Neurological: Negative.  Negative for weakness.  Psychiatric/Behavioral: The patient is nervous/anxious.     As per HPI. Otherwise, a complete review of systems is negative.  PAST MEDICAL HISTORY: Past Medical History:  Diagnosis Date  . 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 Viral warts, unspecified     PAST SURGICAL HISTORY: Past Surgical History:  Procedure Laterality Date  . APPENDECTOMY  1983  . DILATION AND CURETTAGE OF UTERUS     x4  . WISDOM TOOTH EXTRACTION      FAMILY  HISTORY: Family History  Problem Relation Age of Onset  . Breast cancer Mother 31  . Cancer Father        lung- smoker  . Hypertension Father     ADVANCED DIRECTIVES (Y/N):  N  HEALTH MAINTENANCE: Social History   Tobacco Use  . Smoking status: Never Smoker  . Smokeless tobacco: Never Used  Substance Use Topics  . Alcohol use: Yes    Comment: Socially on weekends  . Drug use: No     Colonoscopy:  PAP:  Bone density:  Lipid panel:  Allergies  Allergen Reactions  . Penicillins Rash    Current Outpatient Medications  Medication Sig Dispense Refill  . Ascorbic Acid (VITAMIN C) 500 MG tablet Take 500 mg by mouth daily.      . Glucosamine 500 MG CAPS Take 1 capsule by mouth daily.      . Multiple Vitamin (MULTIVITAMIN) tablet Take 1 tablet by mouth daily.      . vitamin B-12 (CYANOCOBALAMIN) 100 MCG tablet Take 100 mcg by mouth daily.    . Calcium Carbonate-Vitamin D (CALCIUM 600+D) 600-400 MG-UNIT per tablet Take 1 tablet by mouth daily.      . cholecalciferol (VITAMIN D) 1000 UNITS tablet Take 1,000 Units by mouth daily.       No current facility-administered medications for this visit.     OBJECTIVE: Vitals:   10/25/17 1410  BP: (!) 151/95  Pulse: 90  Resp: 20  Temp: (!) 97.3 F (36.3 C)     Body mass index is 25.52 kg/m.    ECOG FS:0 - Asymptomatic  General:  Well-developed, well-nourished, no acute distress. Eyes: Pink conjunctiva, anicteric sclera. HEENT: Normocephalic, moist mucous membranes, clear oropharnyx. Breast: Patient requested exam be deferred today. Lungs: Clear to auscultation bilaterally. Heart: Regular rate and rhythm. No rubs, murmurs, or gallops. Abdomen: Soft, nontender, nondistended. No organomegaly noted, normoactive bowel sounds. Musculoskeletal: No edema, cyanosis, or clubbing. Neuro: Alert, answering all questions appropriately. Cranial nerves grossly intact. Skin: No rashes or petechiae noted. Psych: Normal affect. Lymphatics:  No cervical, calvicular, axillary or inguinal LAD.   LAB RESULTS:  No results found for: NA, K, CL, CO2, GLUCOSE, BUN, CREATININE, CALCIUM, PROT, ALBUMIN, AST, ALT, ALKPHOS, BILITOT, GFRNONAA, GFRAA  No results found for: WBC, NEUTROABS, HGB, HCT, MCV, PLT   STUDIES: US Breast Ltd Uni Left Inc Axilla  Result Date: 10/11/2017 CLINICAL DATA:  Left upper central breast focal asymmetry seen on most recent screening mammography. EXAM: 2D DIGITAL DIAGNOSTIC LEFT MAMMOGRAM WITH CAD AND ADJUNCT TOMO ULTRASOUND LEFT BREAST COMPARISON:  Previous exam(s). ACR Breast Density Category b: There are scattered areas of fibroglandular density. FINDINGS: Eight additional mammographic views of the left breast demonstrate persistent asymmetry/ distortion in the left upper slightly inner quadrant of the left breast, anterior depth. Mammographic images were processed with CAD. On physical exam, no suspicious masses are palpated. Targeted ultrasound is performed, showing area of acoustic shadowing in the left breast 12 o'clock 1 cm from the nipple which measures 0.2 x 0.5 x 0.3 cm. This area corresponds to the mammographically seen focal asymmetry/ architectural distortion. No abnormal left axillary lymph nodes. IMPRESSION: Left breast 12 o'clock 1 cm from the nipple 5 mm area of shadowing, which corresponds to an area of distortion mammographically. RECOMMENDATION: Ultrasound-guided core needle biopsy of left breast 12 o'clock area of shadowing. Attention on postprocedure mammogram to confirm mammographic/sonographic correlation. I have discussed the findings and recommendations with the patient. Results were also provided in writing at the conclusion of the visit. If applicable, a reminder letter will be sent to the patient regarding the next appointment. BI-RADS CATEGORY  4: Suspicious. Electronically Signed   By: Fidela Salisbury M.D.   On: 10/11/2017 14:35   Mm Diag Breast Tomo Uni Left  Result Date:  10/11/2017 CLINICAL DATA:  Left upper central breast focal asymmetry seen on most recent screening mammography. EXAM: 2D DIGITAL DIAGNOSTIC LEFT MAMMOGRAM WITH CAD AND ADJUNCT TOMO ULTRASOUND LEFT BREAST COMPARISON:  Previous exam(s). ACR Breast Density Category b: There are scattered areas of fibroglandular density. FINDINGS: Eight additional mammographic views of the left breast demonstrate persistent asymmetry/ distortion in the left upper slightly inner quadrant of the left breast, anterior depth. Mammographic images were processed with CAD. On physical exam, no suspicious masses are palpated. Targeted ultrasound is performed, showing area of acoustic shadowing in the left breast 12 o'clock 1 cm from the nipple which measures 0.2 x 0.5 x 0.3 cm. This area corresponds to the mammographically seen focal asymmetry/ architectural distortion. No abnormal left axillary lymph nodes. IMPRESSION: Left breast 12 o'clock 1 cm from the nipple 5 mm area of shadowing, which corresponds to an area of distortion mammographically. RECOMMENDATION: Ultrasound-guided core needle biopsy of left breast 12 o'clock area of shadowing. Attention on postprocedure mammogram to confirm mammographic/sonographic correlation. I have discussed the findings and recommendations with the patient. Results were also provided in writing at the conclusion of the visit. If applicable, a reminder letter will be sent to the patient regarding the next appointment. BI-RADS CATEGORY  4: Suspicious. Electronically Signed   By: Fidela Salisbury  M.D.   On: 10/11/2017 14:35   Mm Screening Breast Tomo Bilateral  Result Date: 09/30/2017 CLINICAL DATA:  Screening. EXAM: 2D DIGITAL SCREENING BILATERAL MAMMOGRAM WITH CAD AND ADJUNCT TOMO COMPARISON:  Previous exam(s). ACR Breast Density Category b: There are scattered areas of fibroglandular density. FINDINGS: In the left breast, a possible mass warrants further evaluation. In the right breast, no findings  suspicious for malignancy. Images were processed with CAD. IMPRESSION: Further evaluation is suggested for possible mass in the left breast. RECOMMENDATION: Diagnostic mammogram and possibly ultrasound of the left breast. (Code:[id number]) The patient will be contacted regarding the findings, and additional imaging will be scheduled. BI-RADS CATEGORY  0: Incomplete. Need additional imaging evaluation and/or prior mammograms for comparison. Electronically Signed   By: Marin Olp M.D.   On: 09/30/2017 12:41   Mm Clip Placement Left  Result Date: 10/15/2017 CLINICAL DATA:  Post ultrasound-guided core needle biopsy of left breast 12 o'clock mass/distortion. EXAM: DIAGNOSTIC LEFT MAMMOGRAM POST ULTRASOUND BIOPSY COMPARISON:  Previous exam(s). FINDINGS: Mammographic images were obtained following ultrasound guided biopsy of left breast. Two-view mammography demonstrates presence of wing shaped marker in the left breast slightly upper inner quadrant, anterior depth, at the site of the original mammographically identified distortion/asymmetry. Mild post biopsy changes are seen. IMPRESSION: Appropriate positioning of wing shaped marker post ultrasound-guided core needle biopsy of left breast mass/distortion. Final Assessment: Post Procedure Mammograms for Marker Placement Electronically Signed   By: Fidela Salisbury M.D.   On: 10/15/2017 09:30   [place] Lt Breast Bx W Loc Dev 1st Lesion Img Bx Spec US Guide  Addendum Date: 10/22/2017   ADDENDUM REPORT: 10/22/2017 11:18 ADDENDUM: Pathology of the left breast biopsy revealed A. BREAST, LEFT 12:00; ULTRASOUND GUIDED BIOPSY: INVASIVE MAMMARY CARCINOMA, NO SPECIAL TYPE. Size of invasive carcinoma: 5 mm in this sample. Histologic grade of invasive carcinoma: Overall grade: 2. 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 Lymphovascular invasion: Not identified. Comment: The definitive grade will be assigned on the excisional specimen. These  findings were communicated to [facility] in Dr. Kateri Plummer office on 10/20/2017. Read back procedure was performed. This was found to be concordant with Dr. Kateri Plummer impression and notes. Recommendation:  Surgical and oncology referrals. Results were given to the patient by Dr. Dellis Filbert during a scheduled office visit on 10/20/17. Dr. Dellis Filbert requested that the nurse navigators for [facility] make the referral appointments. The patient was contacted by phone by Jetta Lout, [place] on 10/20/17 for a post biopsy site check. The patient stated she did well following the biopsy. Post biopsy instructions were reviewed with the patient. Request for referrals was relayed to Al Pimple, RN, nurse navigator for [facility]. She contacted the patient on 10/21/17 and made appointments for the patient with Dr. Grayland Ormond, oncologist for 10/25/17 at 1:30 PM and Dr. Hampton Abbot, surgeon for 11/03/16 at 1:45 PM. The patient has been notified of the appointments. She was encouraged to contact the [facility] or [place], [place] with any further questions or concerns. Addendum by Jetta Lout, RRA on 10/22/17. Electronically Signed   By: Fidela Salisbury M.D.   On: 10/22/2017 11:18   Result Date: 10/22/2017 CLINICAL DATA:  Left breast 12 o'clock mass/distortion. EXAM: ULTRASOUND GUIDED LEFT BREAST CORE NEEDLE BIOPSY COMPARISON:  Previous exam(s). FINDINGS: I met with the patient and we discussed the procedure of ultrasound-guided biopsy, including benefits and alternatives. We discussed the high likelihood of a successful procedure. We discussed the risks  of the procedure, including infection, bleeding, tissue injury, clip migration, and inadequate sampling. Informed written consent was given. The usual time-out protocol was performed immediately prior to the procedure. Lesion quadrant: Upper inner quadrant Using sterile technique and 1% Lidocaine as local anesthetic, under direct  ultrasound visualization, a 14 gauge spring-loaded device was used to perform biopsy of left breast 12 o'clock distortion/nodule using a lateral approach. At the conclusion of the procedure a wing shaped tissue marker clip was deployed into the biopsy cavity. Follow up 2 view mammogram was performed and dictated separately. IMPRESSION: Ultrasound guided biopsy of left breast.  No apparent complications. Electronically Signed: By: Fidela Salisbury M.D. On: 10/15/2017 09:31   Mm Outside Films Mammo  Result Date: 10/05/2017 This examination belongs to an outside facility and is stored here for comparison purposes only.  Contact the originating outside institution for any associated report or interpretation.   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  PLAN:    1. 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  She does not wish to undergo mastectomy, therefore she will also require adjuvant XRT.  Patient reports that she is still premenopausal, therefore  given the ER/PR positivity of her tumor she will require tamoxifen for a minimum of 5 years at the completion of all her treatments.  Patient will unlikely require chemotherapy, but this is unclear at this time.  Will await final pathology, including HER-2 results, to make this determination.  Will also send Oncotype DX if necessary.  Given patient's age and family history of breast cancer in her mother at approximately the age of 45, have referred patient for genetic testing.  Patient will follow-up approximately 2 weeks after her surgery to discuss her final pathology results and any additional treatment planning necessary.  Approximately 60 minutes was spent in discussion of which greater than 50%  was consultation.   Patient expressed understanding and was in agreement with this plan. She also understands that She can call clinic at any time with any questions, concerns, or complaints.   Cancer Staging Cancer of central portion of left female breast [facility]) Staging form: Breast, AJCC 8th Edition - Clinical stage from 10/23/2017: Stage IA (cT1a, cN0, cM0, G2, ER: Positive, PR: Positive, HER2: Equivocal) - Signed by Lloyd Huger, MD on 10/23/2017   Lloyd Huger, MD   10/25/2017 3:38 PM

## 2017-10-25 ENCOUNTER — Encounter: Payer: Self-pay | Admitting: *Deleted

## 2017-10-25 ENCOUNTER — Inpatient Hospital Stay: Payer: 59 | Attending: Oncology | Admitting: Oncology

## 2017-10-25 ENCOUNTER — Other Ambulatory Visit: Payer: Self-pay

## 2017-10-25 ENCOUNTER — Encounter: Payer: Self-pay | Admitting: Oncology

## 2017-10-25 DIAGNOSIS — Z170 Estrogen receptor positive status [ER+]: Secondary | ICD-10-CM | POA: Diagnosis not present

## 2017-10-25 DIAGNOSIS — C50112 Malignant neoplasm of central portion of left female breast: Secondary | ICD-10-CM | POA: Diagnosis not present

## 2017-10-25 DIAGNOSIS — Z803 Family history of malignant neoplasm of breast: Secondary | ICD-10-CM | POA: Diagnosis not present

## 2017-10-25 DIAGNOSIS — Z801 Family history of malignant neoplasm of trachea, bronchus and lung: Secondary | ICD-10-CM | POA: Diagnosis not present

## 2017-10-25 DIAGNOSIS — F419 Anxiety disorder, unspecified: Secondary | ICD-10-CM | POA: Insufficient documentation

## 2017-10-25 DIAGNOSIS — B009 Herpesviral infection, unspecified: Secondary | ICD-10-CM | POA: Diagnosis not present

## 2017-10-25 DIAGNOSIS — Z79899 Other long term (current) drug therapy: Secondary | ICD-10-CM | POA: Diagnosis not present

## 2017-10-25 NOTE — Progress Notes (Signed)
Patient here today for initial evaluation regarding breast cancer.  

## 2017-10-27 ENCOUNTER — Telehealth: Payer: Self-pay | Admitting: Genetic Counselor

## 2017-10-27 LAB — PAP IG W/ RFLX HPV ASCU

## 2017-10-27 LAB — HUMAN PAPILLOMAVIRUS, HIGH RISK: HPV DNA High Risk: NOT DETECTED

## 2017-10-27 NOTE — Telephone Encounter (Signed)
Holly Bennett was referred for genetic counseling by Dr. Grayland Ormond due to a personal and family history of cancer. I spoke with her her today to schedule this telegenetics visit to be done by phone on Monday, 11/01/17 at [place], [place], [facility] Genetic Counselor Phone: [phone]

## 2017-10-27 NOTE — Progress Notes (Signed)
  Oncology Nurse Navigator Documentation  Navigator Location: [facility]-Med Onc (10/27/17 1000)   )Navigator Encounter Type: Initial MedOnc (10/27/17 1000)                     Patient Visit Type: MedOnc (10/27/17 1000) Treatment Phase: Pre-Tx/Tx Discussion (10/27/17 1000)                            Time Spent with Patient: 15 (10/27/17 1000)   Met patient and her daughter during her initial medical oncology consult with Dr. Grayland Ormond.  Offered support.  She is to call if she has any questions or needs.

## 2017-10-28 ENCOUNTER — Other Ambulatory Visit: Payer: Self-pay | Admitting: *Deleted

## 2017-10-29 ENCOUNTER — Encounter: Payer: Self-pay | Admitting: Diagnostic Radiology

## 2017-10-29 LAB — SURGICAL PATHOLOGY

## 2017-11-01 ENCOUNTER — Telehealth: Payer: Self-pay | Admitting: Genetic Counselor

## 2017-11-01 NOTE — Telephone Encounter (Signed)
           Cancer Genetics            Telegenetics Initial Visit    Patient Name: Holly Bennett Patient DOB: 08/16/1966 Patient Age: 51 y.o. Phone Call Date: 11/01/2017  Referring Provider: Timothy Finnegan, MD  Reason for Visit: Evaluate for hereditary susceptibility to cancer    Assessment and Plan:  . Holly Bennett's history is not highly suggestive of a hereditary predisposition to cancer, but her father's side of the family is very small and he had no sisters. This paucity of women makes risk assessment challenging.    . Testing is recommended to determine whether she has a pathogenic mutation that will impact her decision of whether to pursue bilateral mastectomies as well as her screening and risk-reduction for future cancer. A negative result will be reassuring.  . Holly Bennett wished to pursue genetic testing and a blood sample will be sent to [facility] for analysis. [facility]'s STAT breast panel was requested as it will impact surgical decisions. Results should be available in about 7-12 days from when the lab receives her specimen. The 9 genes on this panel are ATM, BRCA1, BRCA2, CDH1, CHEK2, PALB2, PTEN, STK11, TP53. Once this test is complete, analysis of additional genes on a larger hereditary cancer panel will proceed. She will be called after each result is obtained.   Dr. Finnegan was available for questions concerning this case. Total time spent by counseling by phone was approximately 25 minutes.   _____________________________________________________________________   History of Present Illness: Holly Bennett, a 51 y.o. female, was referred for genetic counseling to discuss the possibility of a hereditary predisposition to cancer and discuss whether genetic testing is warranted. This was a telegenetics visit via phone.  Holly Bennett was recently diagnosed with breast cancer at the age of 51. She indicated that she wants to use results of genetic testing to decide  which surgery to have. She will be meeting with a breast surgeon on 11/03/17.  Holly Bennett has a history of non-melanoma skin cancer.  Past Medical History:  Diagnosis Date  . Acute right flank pain 01/21/2011  . Back pain 01/21/2011  . Cancer of central portion of left female breast (HCC) 10/23/2017  . 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 Personal history of other malignant neoplasm of skin 06/10/2017  . Viral warts, unspecified     Past Surgical History:  Procedure Laterality Date  . APPENDECTOMY  1983  . DILATION AND CURETTAGE OF UTERUS     x4  . WISDOM TOOTH EXTRACTION      Family History: Significant diagnoses include the following:  Family History  Problem Relation Age of Onset  . Breast cancer Mother 62       currently 78  . Hypertension Father   . Lung cancer Father 74       smoker; deceased 74  . Breast cancer Other        mat grandmother's sister    Additionally, Holly Bennett has 2 daughters, ages 16 and 19. She has no full siblings and one maternal half-brother (age 57). Her mother (noted above) has one sister (age 81). Her father (noted above) was an only child.  Holly Bennett's ancestry is Caucasian - NOS. There is no known Jewish ancestry and no consanguinity.  Discussion: We reviewed the characteristics, features and inheritance patterns of hereditary cancer syndromes. We discussed her risk of harboring a mutation in   the context of her personal and family history. We discussed that her small paternal family and paucity of women makes risk assessment challenging. We discussed the process of genetic testing, insurance coverage and implications of results: positive, negative and variant of unknown significance (VUS).   Holly Bennett questions were answered to her satisfaction today and she is welcome to call with any additional questions or concerns. Thank you for the referral and allowing [place] to share in the care of your patient.    Steele Berg, MS, [place] Certified Genetic Counselor phone: [phone]

## 2017-11-02 ENCOUNTER — Inpatient Hospital Stay: Payer: 59 | Attending: Oncology

## 2017-11-02 DIAGNOSIS — C44319 Basal cell carcinoma of skin of other parts of face: Secondary | ICD-10-CM | POA: Diagnosis not present

## 2017-11-03 ENCOUNTER — Encounter: Payer: Self-pay | Admitting: Surgery

## 2017-11-03 ENCOUNTER — Ambulatory Visit (INDEPENDENT_AMBULATORY_CARE_PROVIDER_SITE_OTHER): Payer: 59 | Admitting: Surgery

## 2017-11-03 VITALS — BP 161/85 | HR 85 | Temp 98.4°F | Ht 64.5 in | Wt 146.0 lb

## 2017-11-03 DIAGNOSIS — Z170 Estrogen receptor positive status [ER+]: Secondary | ICD-10-CM | POA: Diagnosis not present

## 2017-11-03 DIAGNOSIS — C50112 Malignant neoplasm of central portion of left female breast: Secondary | ICD-10-CM | POA: Diagnosis not present

## 2017-11-03 NOTE — Patient Instructions (Signed)
We have spoken today about removing a lump in your breast. This will be done on 11/19/2017 by Dr. Olean Ree at [facility].  You will most likely be able to leave the hospital several hours after your surgery. Rarely, a patient needs to stay over night but this is a possibility.  Plan to tenatively be off work for 1-2 weeks following the surgery and may return with approximately 4 more weeks of a lifting restriction, no greater than 15 lbs.    Lumpectomy A lumpectomy is a form of "breast conserving" or "breast preservation" surgery. It may also be referred to as a partial mastectomy. During a lumpectomy, the portion of the breast that contains the cancerous tumor or breast mass (the lump) is removed. Some normal tissue around the lump may also be removed to make sure all of the tumor has been removed.  LET [facility] CARE PROVIDER KNOW ABOUT:  Any allergies you have.  All medicines you are taking, including vitamins, herbs, eye drops, creams, and over-the-counter medicines.  Previous problems you or members of your family have had with the use of anesthetics.  Any blood disorders you have.  Previous surgeries you have had.  Medical conditions you have. RISKS AND COMPLICATIONS Generally, this is a safe procedure. However, problems can occur and include:  Bleeding.  Infection.  Pain.  Temporary swelling.  Change in the shape of the breast, particularly if a large portion is removed. BEFORE THE PROCEDURE  Ask your health care provider about changing or stopping your regular medicines. This is especially important if you are taking diabetes medicines or blood thinners.  Do not eat or drink anything after midnight on the night before the procedure or as directed by your health care provider. Ask your health care provider if you can take a sip of water with any approved medicines.  On the day of surgery, your health care provider will use a mammogram or ultrasound to locate and mark  the tumor in your breast. These markings on your breast will show where the cut (incision) will be made. PROCEDURE   An IV tube will be put into one of your veins.  You may be given medicine to help you relax before the surgery (sedative). You will be given one of the following:  A medicine that numbs the area (local anesthetic).  A medicine that makes you fall asleep (general anesthetic).  Your health care provider will use a kind of electric scalpel that uses heat to minimize bleeding (electrocautery knife).  A curved incision (like a smile or frown) that follows the natural curve of your breast is made, to allow for minimal scarring and better healing.  The tumor will be removed with some of the surrounding tissue. This will be sent to the lab for analysis. Your health care provider may also remove your lymph nodes at this time if needed.  Sometimes, but not always, a rubber tube called a drain will be surgically inserted into your breast area or armpit to collect excess fluid that may accumulate in the space where the tumor was. This drain is connected to a plastic bulb on the outside of your body. This drain creates suction to help remove the fluid.  The incisions will be closed with stitches (sutures).  A bandage may be placed over the incisions. AFTER THE PROCEDURE  You will be taken to the recovery area.  You will be given medicine for pain.  A small rubber drain may be placed in  the breast for 2-3 days to prevent a collection of blood (hematoma) from developing in the breast. You will be given instructions on caring for the drain before you go home.  A pressure bandage (dressing) will be applied for 1-2 days to prevent bleeding. Ask your health care provider how to care for your bandage at home.   This information is not intended to replace advice given to you by your health care provider. Make sure you discuss any questions you have with your health care provider.     Document Released: 11/23/2006 Document Revised: 11/02/2014 Document Reviewed: 03/17/2013 Elsevier Interactive Patient Education [employer].

## 2017-11-03 NOTE — Progress Notes (Signed)
11/03/2017  Reason for Visit:  Left breast invasive cancer  Referring Provider:  Princess Bruins, MD  History of Present Illness: Holly Bennett is a 52 y.o. female who presents with new diagnosis of left breast cancer.  This was found on routine mammogram, which was followed up by breast biopsy.  Pathology resulted in invasive mammary carcinoma, no special type, ER/PR positive and HER-2 equivocal.  She has seen Oncology and is followed up by Dr. Grayland Ormond.  She presents today for surgical evaluation and management.  Patient does have family history of breast cancer, and her mother was diagnosed at age 60.  Given her family history and young age at diagnosis for the patient, she was also referred for genetic evaluation and sent specimen yesterday for genetic testing.    Patient reports that she could never feel a mass and was not aware of any issues until mammogram.  She denies any skin color changes, skin retraction, nipple changes or drainage, pain, or fullness.  She has not had any hormonal therapy and currently is pre-menopausal.  Has two children.  Denies any weight loss or fatigue.  She's still trying to deal with the news of having cancer and is anxious today.  Past Medical History: Past Medical History:  Diagnosis Date  . Acute right flank pain 01/21/2011  . Back pain 01/21/2011  . Cancer of central portion of left female breast ([place]) 10/23/2017  . 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 Personal history of other malignant neoplasm of skin 06/10/2017  . Viral warts, unspecified      Past Surgical History: Past Surgical History:  Procedure Laterality Date  . APPENDECTOMY  1983  . DILATION AND CURETTAGE OF UTERUS     x4  . WISDOM TOOTH EXTRACTION      Home Medications: Prior to Admission medications   Medication Sig Start Date End Date Taking? Authorizing Provider  Ascorbic Acid (VITAMIN C) 500 MG tablet Take 500 mg by mouth daily.     Yes [provider]  Calcium Carbonate-Vitamin D (CALCIUM 600+D) 600-400 MG-UNIT per tablet Take 1 tablet by mouth daily.     Yes [provider]  cholecalciferol (VITAMIN D) 1000 UNITS tablet Take 1,000 Units by mouth daily.     Yes [provider]  Glucosamine 500 MG CAPS Take 1 capsule by mouth daily.     Yes [provider]  Multiple Vitamin (MULTIVITAMIN) tablet Take 1 tablet by mouth daily.     Yes [provider]  vitamin B-12 (CYANOCOBALAMIN) 100 MCG tablet Take 100 mcg by mouth daily.   Yes [provider]    Allergies: Allergies  Allergen Reactions  . Penicillins Rash    Social History:  reports that  has never smoked. she has never used smokeless tobacco. She reports that she drinks alcohol. She reports that she does not use drugs.   Family History: Family History  Problem Relation Age of Onset  . Breast cancer Mother 76       currently 39  . Hypertension Father   . Lung cancer Father 6       smoker; deceased 79  . Breast cancer Other        mat grandmother's sister    Review of Systems: Review of Systems  Constitutional: Negative for chills and fever.  HENT: Negative for hearing loss.   Eyes: Negative for blurred vision.  Respiratory: Negative for shortness of breath.   Cardiovascular: Negative  for chest pain.  Gastrointestinal: Negative for abdominal pain, constipation, diarrhea, nausea and vomiting.  Genitourinary: Negative for dysuria.  Musculoskeletal: Negative for myalgias.  Skin: Negative for rash.  Neurological: Negative for dizziness.  Psychiatric/Behavioral: Negative for depression.  All other systems reviewed and are negative.   Physical Exam BP (!) 161/85   Pulse 85   Temp 98.4 F (36.9 C) (Oral)   Ht 5' 4.5" (1.638 m)   Wt 66.2 kg (146 lb)   LMP 10/20/2017 (Exact Date) Comment: JUNEL  BMI 24.67 kg/m  CONSTITUTIONAL: No acute distress HEENT:  Normocephalic, atraumatic, extraocular motion  intact. NECK: Trachea is midline, and there is no jugular venous distension.  RESPIRATORY:  Lungs are clear, and breath sounds are equal bilaterally. Normal respiratory effort without pathologic use of accessory muscles. CARDIOVASCULAR: Heart is regular without murmurs, gallops, or rubs. BREAST:  Left breast exam reveals no palpable masses and no drainage from nipple.  There is a 2 cm area superior to nipple of ecchymosis from her biopsy.  There is otherwise no significant hematoma.  No palpable lymphadenopathy of axilla on left side.  On right side, there are no palpable breast masses or nipple pathology.  No palpable right axillary nodes either. GI: The abdomen is soft, nondistended, nontender.  MUSCULOSKELETAL:  Normal muscle strength and tone in all four extremities.  No peripheral edema or cyanosis. SKIN: Skin turgor is normal. There are no pathologic skin lesions.  NEUROLOGIC:  Motor and sensation is grossly normal.  Cranial nerves are grossly intact. PSYCH:  Alert and oriented to person, place and time. Affect is normal.  Laboratory Analysis: None recently  Imaging: Mammogram on 12/17 shows: Left breast 12 o'clock 1 cm from the nipple 5 mm area of shadowing, which corresponds to an area of distortion mammographically.  Assessment and Plan: This is a 52 y.o. female who presents with new diagnosis of left breast invasive cancer.  The patient denies any fatigue, abdominal pain, constipation, jaundice, or other issues.  Has been seen by Dr. Grayland Ormond as well.  Currently genetic studies are pending.  Discussed with the patient that at this point, we would like to proceed with surgical management, and that at this point there are options in the type of surgery to proceed with.  After discussing breast conserving therapy vs mastectomy, she has opted for lumpectomy.  As her genetic studies are pending still, she says that once she has this information, she may change her mind to mastectomies, but  that as of now, lumpectomy alone would be her choice.  She is aware that after lumpectomy, she would require radiation therapy and possibly chemotherapy and at least endocrine therapy.  Given the small size of this mass, would proceed with wire-localization preoperatively.  Also, given the invasiveness, would also perform a sentinel node biopsy.  Risks of bleeding, infection, injury to surrounding structures were discussed and she is willing to proceed.  She will tentatively be scheduled for 11/19/17, pending her genetic studies.  Patient understands this plan and all of her questions have been answered.   Face-to-face time spent with the patient and care providers was 60 minutes, with more than 50% of the time spent counseling, educating, and coordinating care of the patient.     [name], [place]

## 2017-11-03 NOTE — H&P (View-Only) (Signed)
11/03/2017  Reason for Visit:  Left breast invasive cancer  Referring Provider:  Princess Bruins, MD  History of Present Illness: Holly Bennett is a 52 y.o. female who presents with new diagnosis of left breast cancer.  This was found on routine mammogram, which was followed up by breast biopsy.  Pathology resulted in invasive mammary carcinoma, no special type, ER/PR positive and HER-2 equivocal.  She has seen Oncology and is followed up by Dr. Grayland Ormond.  She presents today for surgical evaluation and management.  Patient does have family history of breast cancer, and her mother was diagnosed at age 32.  Given her family history and young age at diagnosis for the patient, she was also referred for genetic evaluation and sent specimen yesterday for genetic testing.    Patient reports that she could never feel a mass and was not aware of any issues until mammogram.  She denies any skin color changes, skin retraction, nipple changes or drainage, pain, or fullness.  She has not had any hormonal therapy and currently is pre-menopausal.  Has two children.  Denies any weight loss or fatigue.  She's still trying to deal with the news of having cancer and is anxious today.  Past Medical History: Past Medical History:  Diagnosis Date  . Acute right flank pain 01/21/2011  . Back pain 01/21/2011  . Cancer of central portion of left female breast ([place]) 10/23/2017  . 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 Personal history of other malignant neoplasm of skin 06/10/2017  . Viral warts, unspecified      Past Surgical History: Past Surgical History:  Procedure Laterality Date  . APPENDECTOMY  1983  . DILATION AND CURETTAGE OF UTERUS     x4  . WISDOM TOOTH EXTRACTION      Home Medications: Prior to Admission medications   Medication Sig Start Date End Date Taking? Authorizing Provider  Ascorbic Acid (VITAMIN C) 500 MG tablet Take 500 mg by mouth daily.     Yes [provider]  Calcium Carbonate-Vitamin D (CALCIUM 600+D) 600-400 MG-UNIT per tablet Take 1 tablet by mouth daily.     Yes [provider]  cholecalciferol (VITAMIN D) 1000 UNITS tablet Take 1,000 Units by mouth daily.     Yes [provider]  Glucosamine 500 MG CAPS Take 1 capsule by mouth daily.     Yes [provider]  Multiple Vitamin (MULTIVITAMIN) tablet Take 1 tablet by mouth daily.     Yes [provider]  vitamin B-12 (CYANOCOBALAMIN) 100 MCG tablet Take 100 mcg by mouth daily.   Yes [provider]    Allergies: Allergies  Allergen Reactions  . Penicillins Rash    Social History:  reports that  has never smoked. she has never used smokeless tobacco. She reports that she drinks alcohol. She reports that she does not use drugs.   Family History: Family History  Problem Relation Age of Onset  . Breast cancer Mother 40       currently 68  . Hypertension Father   . Lung cancer Father 70       smoker; deceased 58  . Breast cancer Other        mat grandmother's sister    Review of Systems: Review of Systems  Constitutional: Negative for chills and fever.  HENT: Negative for hearing loss.   Eyes: Negative for blurred vision.  Respiratory: Negative for shortness of breath.   Cardiovascular: Negative  for chest pain.  Gastrointestinal: Negative for abdominal pain, constipation, diarrhea, nausea and vomiting.  Genitourinary: Negative for dysuria.  Musculoskeletal: Negative for myalgias.  Skin: Negative for rash.  Neurological: Negative for dizziness.  Psychiatric/Behavioral: Negative for depression.  All other systems reviewed and are negative.   Physical Exam BP (!) 161/85   Pulse 85   Temp 98.4 F (36.9 C) (Oral)   Ht 5' 4.5" (1.638 m)   Wt 66.2 kg (146 lb)   LMP 10/20/2017 (Exact Date) Comment: JUNEL  BMI 24.67 kg/m  CONSTITUTIONAL: No acute distress HEENT:  Normocephalic, atraumatic, extraocular motion  intact. NECK: Trachea is midline, and there is no jugular venous distension.  RESPIRATORY:  Lungs are clear, and breath sounds are equal bilaterally. Normal respiratory effort without pathologic use of accessory muscles. CARDIOVASCULAR: Heart is regular without murmurs, gallops, or rubs. BREAST:  Left breast exam reveals no palpable masses and no drainage from nipple.  There is a 2 cm area superior to nipple of ecchymosis from her biopsy.  There is otherwise no significant hematoma.  No palpable lymphadenopathy of axilla on left side.  On right side, there are no palpable breast masses or nipple pathology.  No palpable right axillary nodes either. GI: The abdomen is soft, nondistended, nontender.  MUSCULOSKELETAL:  Normal muscle strength and tone in all four extremities.  No peripheral edema or cyanosis. SKIN: Skin turgor is normal. There are no pathologic skin lesions.  NEUROLOGIC:  Motor and sensation is grossly normal.  Cranial nerves are grossly intact. PSYCH:  Alert and oriented to person, place and time. Affect is normal.  Laboratory Analysis: None recently  Imaging: Mammogram on 12/17 shows: Left breast 12 o'clock 1 cm from the nipple 5 mm area of shadowing, which corresponds to an area of distortion mammographically.  Assessment and Plan: This is a 52 y.o. female who presents with new diagnosis of left breast invasive cancer.  The patient denies any fatigue, abdominal pain, constipation, jaundice, or other issues.  Has been seen by Dr. Grayland Ormond as well.  Currently genetic studies are pending.  Discussed with the patient that at this point, we would like to proceed with surgical management, and that at this point there are options in the type of surgery to proceed with.  After discussing breast conserving therapy vs mastectomy, she has opted for lumpectomy.  As her genetic studies are pending still, she says that once she has this information, she may change her mind to mastectomies, but  that as of now, lumpectomy alone would be her choice.  She is aware that after lumpectomy, she would require radiation therapy and possibly chemotherapy and at least endocrine therapy.  Given the small size of this mass, would proceed with wire-localization preoperatively.  Also, given the invasiveness, would also perform a sentinel node biopsy.  Risks of bleeding, infection, injury to surrounding structures were discussed and she is willing to proceed.  She will tentatively be scheduled for 11/19/17, pending her genetic studies.  Patient understands this plan and all of her questions have been answered.   Face-to-face time spent with the patient and care providers was 60 minutes, with more than 50% of the time spent counseling, educating, and coordinating care of the patient.     [name], [place]

## 2017-11-04 ENCOUNTER — Other Ambulatory Visit: Payer: Self-pay | Admitting: Surgery

## 2017-11-04 DIAGNOSIS — C50912 Malignant neoplasm of unspecified site of left female breast: Secondary | ICD-10-CM | POA: Diagnosis not present

## 2017-11-04 DIAGNOSIS — C50112 Malignant neoplasm of central portion of left female breast: Secondary | ICD-10-CM

## 2017-11-04 NOTE — Addendum Note (Signed)
Addended by: Riki Sheer on: 11/04/2017 09:50 AM   Modules accepted: Orders

## 2017-11-08 ENCOUNTER — Telehealth: Payer: Self-pay | Admitting: Surgery

## 2017-11-08 NOTE — Telephone Encounter (Signed)
Pt advised of pre op date/time and sx date. Sx: 11/17/17 with Dr Jordan Likes breast lumpectomy with SN and NL. Pre op: 11/12/17 between 9-1:00pm--phone interview.   Patient made aware to arrive at [facility] breast center at 7:45am the day of surgery.   Patient understands all directions.

## 2017-11-09 ENCOUNTER — Telehealth: Payer: Self-pay | Admitting: Genetic Counselor

## 2017-11-09 NOTE — Telephone Encounter (Signed)
            Cancer Genetics     Telegenetics Results Disclosure        Patient Name: DAMILOLA FLAMM Patient DOB: [birth year] Patient Age: 52 y.o. Encounter Date: 11/09/2017  Referring Provider: Delight Hoh, MD  Reason for Call: Discuss results of genetic testing- 1st of 2 results   This is a brief note to document preliminary genetic test results.  Ms. Gainer was called today to discuss the first of her genetic test results. Please see the Genetics telephone note from 11/01/2017. Due to time contraints and needing actionable results for medical management, [facility]'s STAT Breast panel was ordered first. A detailed message was left for Ms. Keats with the results and to call back with any questions.  Preliminary Test Results: Genetic testing involved analysis of 9 genes: ATM, BRCA1, BRCA2, CDH1, CHEK2, PALB2, PTEN, STK11 and TP53 genes. Testing was normal and did not reveal a mutation in these genes. Testing is in process for the remaining genes on a larger gene panel.   Once results are obtained, Ms. Bolick will be called again. She does not need to wait to proceed with surgery. She is scheduled for a lumpectomy with Dr. Hampton Abbot on 11/19/17.   Steele Berg, MS, [place] Certified Genetic Counselor phone: [phone]

## 2017-11-12 ENCOUNTER — Telehealth: Payer: Self-pay | Admitting: Genetic Counselor

## 2017-11-12 ENCOUNTER — Other Ambulatory Visit: Payer: Self-pay

## 2017-11-12 ENCOUNTER — Encounter
Admission: RE | Admit: 2017-11-12 | Discharge: 2017-11-12 | Disposition: A | Discharge status: Home / Self Care | Payer: 59 | Source: Ambulatory Visit | Attending: Surgery | Admitting: Surgery

## 2017-11-12 ENCOUNTER — Encounter: Payer: Self-pay | Admitting: Genetic Counselor

## 2017-11-12 DIAGNOSIS — Z1379 Encounter for other screening for genetic and chromosomal anomalies: Secondary | ICD-10-CM | POA: Insufficient documentation

## 2017-11-12 HISTORY — DX: Personal history of urinary calculi: Z87.442

## 2017-11-12 HISTORY — DX: Cardiac murmur, unspecified: R01.1

## 2017-11-12 NOTE — Pre-Procedure Instructions (Signed)
Left voicemail for patient to call back to pre admit testing for her pre op interview.

## 2017-11-12 NOTE — Patient Instructions (Signed)
Your procedure is scheduled on: November 17, 2017  Report to [facility] AT 7:30 AM.   [facility] A URINE SAMPLE FOR PREGNANCY TEST, THEY WILL ESCORT YOU TO [place].    Remember: Instructions that are not followed completely may result in serious medical risk, up to and including death, or upon the discretion of your surgeon and anesthesiologist your surgery may need to be rescheduled.     _X__ 1. Do not eat food after midnight the night before your procedure.                 No gum chewing or hard candies. You may drink clear liquids up to 2 hours                 before you are scheduled to arrive for your surgery- DO not drink clear                 liquids within 2 hours of the start of your surgery.                 Clear Liquids include:  water, apple juice without pulp, clear carbohydrate                 drink such as Clearfast of Gartorade, Black Coffee or Tea (Do not add                 anything to coffee or tea).     _X__ 2.  No Alcohol for 24 hours before or after surgery.   _X__ 3.  Do Not Smoke or use e-cigarettes For 24 Hours Prior to Your Surgery.                 Do not use any chewable tobacco products for at least 6 hours prior to                 surgery.  ____  4.  Bring all medications with you on the day of surgery if instructed.   __X__  5.  Notify your doctor if there is any change in your medical condition      (cold, fever, infections).     Do not wear jewelry, make-up, hairpins, clips or nail polish. Do not wear lotions, powders, or perfumes. You may wear deodorant. Do not shave 48 hours prior to surgery. Men may shave face and neck. Do not bring valuables to the hospital.    [facility] is not responsible for any belongings or valuables.  Contacts, dentures or bridgework may not be worn into surgery. Leave your suitcase in the car. After surgery it may be brought to your room. For  patients admitted to the hospital, discharge time is determined by your treatment team.   Patients discharged the day of surgery will not be allowed to drive home.   Please read over the following fact sheets that you were given:   [place] PHONE   ____ Take these medicines the morning of surgery with A SIP OF WATER:    1.NONE  2.   3.   4.  5.  6.  ____ Fleet Enema (as directed)   _X___ Use ANTIBACTERIAL SOAP ON THE MORNING OF SURGERY.   _X___ Stop ASPIRIN PRODUCTS AS OF TODAY, November 12, 2017  _X___ Stop Anti-inflammatories AS OF TODAY, November 12, 2017.  THIS INCLUDES IBUPROFEN / ADVIL / MOTRIN / ALEVE / NAPROSYN /  GOODIES POWDERS.             YOU MAY TAKE TYLENOL FOR DISCOMFORTS.    _X___ Stop supplements until after surgery. THIS INCLUDES VITAMIN C AND CALCIUM PLUS D.  YOU MAY CONTINUE TO TAKE THE MULTIVITAMINS,              VITAMIN B12 AND VITAMIN D UP UNTIL THE DAY BEFORE SURGERY.  DO NOT TAKE ON SURGERY DAY.   ____ Bring C-Pap to the hospital.

## 2017-11-12 NOTE — Pre-Procedure Instructions (Signed)
Patient to arrive at [facility] on day of surgery for a pregnancy test prior to going to [place].  Informed the staff at [facility], [facility] and the patient herself.

## 2017-11-12 NOTE — Telephone Encounter (Signed)
Cancer Genetics             Telegenetics Results Disclosure   Patient Name: Holly Bennett Patient DOB: Jan 23, 1966 Patient Age: 52 y.o. Phone Call Date: 11/12/2017  Referring Provider: Delight Hoh, MD   Ms. Holly Bennett was called today to discuss genetic test results. A detailed message was left for her with the information below. Please see the Genetics telephone note from 11/01/2017 for a detailed discussion of her personal and family histories and the recommendations provided.  Genetic Testing: At the time of Ms. Holly Bennett's telegenetics visit, she decided to pursue genetic testing of 9 genes that may be used to help guide treatment decisions. Once that test was completed, additional genes on a larger panel were analyzed. Testing which included sequencing and deletion/duplication analysis. Testing did not reveal any pathogenic mutation in any of these genes. A copy of the genetic test report will be scanned into Epic under the Media tab.  The genes analyzed were the 47 genes on [facility]'s Common Cancers panel (APC, ATM, AXIN2, BARD1, BMPR1A, BRCA1, BRCA2, BRIP1, CDH1, CDK4, CDKN2A, CHEK2, CTNNA1, DICER1, EPCAM, GREM1, HOXB13, KIT, MEN1, MLH1, MSH2, MSH3, MSH6, MUTYH, NBN, NF1, NTHL1, PALB2, PDGFRA, PMS2, POLD1, POLE, PTEN, RAD50, RAD51C, RAD51D, SDHA, SDHB, SDHC, SDHD, SMAD4, SMARCA4, STK11, TP53, TSC1, TSC2, VHL).  Since the current test is not perfect, it is possible that there may be a gene mutation that current testing cannot detect, but that chance is small. It is possible that a different genetic factor, which has not yet been discovered or is not on this panel, is responsible for the cancer diagnoses in the family. Again, the likelihood of this is low. No additional testing is recommended at this time for Ms. Holly Bennett.  A Variant of Uncertain Significance was detected: DICER1 c.[id number] ([id number]). This is still considered a normal result. While at this time, it is  unknown if this finding is associated with increased cancer risk, the majority of these variants get reclassified to be inconsequential. Medical management should not be based on this finding. With time, we suspect the lab will determine the significance, if any. If we do learn more about it, we will try to contact Ms. Holly Bennett to discuss it further. It is important to stay in touch with [place] periodically and keep the address and phone number up to date.   Cancer Screening: These results suggest that Ms. Holly Bennett cancer was most likely not due to an inherited predisposition. Most cancers happen by chance and this test, along with details of her family history, suggests that her cancer falls into this category. She is recommended to continue to follow the cancer screening guidelines provided by her physician.   Family Members: Family members are at some increased risk of developing cancer, over the general population risk, simply due to the family history. Women are recommended to have a yearly mammogram beginning at age 31, a yearly clinical breast exam, a yearly gynecologic exam and perform monthly breast self-exams. Colon cancer screening is recommended to begin by age 62 in both men and women, unless there is a family history of colon cancer or colon polyps or an individual has a personal history to warrant initiating screening at a younger age.  Any relative who had cancer at a young age or had a particularly rare cancer may also wish to pursue genetic testing. Genetic counselors can  be located in other cities, by visiting the website of the [employer] ([employer]) and Field seismologist for a Dietitian by zip code.   Family members are not recommended to get tested for the above VUS outside of a research protocol as this finding has no implications for their medical management.     Lastly, cancer genetics is a rapidly advancing field and it is possible that new genetic tests  will be appropriate for Ms. Holly Bennett in the future. We encourage Ms. Holly Bennett to remain in contact with Genetics on an annual basis so her personal and family histories can be updated.    Holly Berg, MS, [place] Certified Genetic Counselor phone: [phone]

## 2017-11-16 MED ORDER — CLINDAMYCIN PHOSPHATE 900 MG/50ML IV SOLN
900.0000 mg | INTRAVENOUS | Status: AC
  Administered 2017-11-17: 900 mg via INTRAVENOUS

## 2017-11-17 ENCOUNTER — Ambulatory Visit: Payer: 59 | Admitting: Anesthesiology

## 2017-11-17 ENCOUNTER — Ambulatory Visit
Admission: RE | Admit: 2017-11-17 | Discharge: 2017-11-17 | Disposition: A | Discharge status: Home / Self Care | Payer: 59 | Source: Ambulatory Visit | Attending: Surgery | Admitting: Surgery

## 2017-11-17 ENCOUNTER — Encounter
Admission: RE | Disposition: A | Discharge status: Home / Self Care | Payer: Self-pay | Source: Ambulatory Visit | Attending: Surgery

## 2017-11-17 ENCOUNTER — Encounter
Admission: RE | Admit: 2017-11-17 | Discharge: 2017-11-17 | Disposition: A | Discharge status: Home / Self Care | Payer: 59 | Source: Ambulatory Visit | Attending: Surgery | Admitting: Surgery

## 2017-11-17 ENCOUNTER — Encounter: Payer: Self-pay | Admitting: Emergency Medicine

## 2017-11-17 DIAGNOSIS — C50212 Malignant neoplasm of upper-inner quadrant of left female breast: Secondary | ICD-10-CM | POA: Diagnosis not present

## 2017-11-17 DIAGNOSIS — Z170 Estrogen receptor positive status [ER+]: Secondary | ICD-10-CM | POA: Diagnosis not present

## 2017-11-17 DIAGNOSIS — Z79899 Other long term (current) drug therapy: Secondary | ICD-10-CM | POA: Diagnosis not present

## 2017-11-17 DIAGNOSIS — Z803 Family history of malignant neoplasm of breast: Secondary | ICD-10-CM | POA: Diagnosis not present

## 2017-11-17 DIAGNOSIS — C50112 Malignant neoplasm of central portion of left female breast: Secondary | ICD-10-CM | POA: Diagnosis not present

## 2017-11-17 DIAGNOSIS — Z880 Allergy status to penicillin: Secondary | ICD-10-CM | POA: Diagnosis not present

## 2017-11-17 DIAGNOSIS — C50912 Malignant neoplasm of unspecified site of left female breast: Secondary | ICD-10-CM

## 2017-11-17 DIAGNOSIS — Z85828 Personal history of other malignant neoplasm of skin: Secondary | ICD-10-CM | POA: Diagnosis not present

## 2017-11-17 HISTORY — PX: BREAST LUMPECTOMY WITH NEEDLE LOCALIZATION: SHX5759

## 2017-11-17 HISTORY — PX: AXILLARY SENTINEL NODE BIOPSY: SHX5738

## 2017-11-17 HISTORY — PX: BREAST LUMPECTOMY: SHX2

## 2017-11-17 LAB — POCT PREGNANCY, URINE: Preg Test, Ur: NEGATIVE

## 2017-11-17 SURGERY — BREAST LUMPECTOMY WITH NEEDLE LOCALIZATION
Anesthesia: General | Laterality: Left | Wound class: Clean

## 2017-11-17 MED ORDER — FAMOTIDINE 20 MG PO TABS
20.0000 mg | ORAL_TABLET | Freq: Once | ORAL | Status: AC
  Administered 2017-11-17: 20 mg via ORAL

## 2017-11-17 MED ORDER — CLINDAMYCIN PHOSPHATE 900 MG/50ML IV SOLN
INTRAVENOUS | Status: AC
  Filled 2017-11-17: qty 50

## 2017-11-17 MED ORDER — BUPIVACAINE-EPINEPHRINE 0.5% -1:200000 IJ SOLN
INTRAMUSCULAR | Status: DC | PRN
  Administered 2017-11-17: 20 mL

## 2017-11-17 MED ORDER — ONDANSETRON HCL 4 MG/2ML IJ SOLN
INTRAMUSCULAR | Status: DC | PRN
  Administered 2017-11-17: 4 mg via INTRAVENOUS

## 2017-11-17 MED ORDER — ONDANSETRON HCL 4 MG/2ML IJ SOLN: 4.0000 mg | Freq: Once | INTRAMUSCULAR | Status: DC | PRN

## 2017-11-17 MED ORDER — PROPOFOL 10 MG/ML IV BOLUS
INTRAVENOUS | Status: AC
  Filled 2017-11-17: qty 20

## 2017-11-17 MED ORDER — FAMOTIDINE 20 MG PO TABS
ORAL_TABLET | ORAL | Status: AC
  Administered 2017-11-17: 20 mg via ORAL
  Filled 2017-11-17: qty 1

## 2017-11-17 MED ORDER — DEXAMETHASONE SODIUM PHOSPHATE 10 MG/ML IJ SOLN
INTRAMUSCULAR | Status: DC | PRN
  Administered 2017-11-17: 5 mg via INTRAVENOUS

## 2017-11-17 MED ORDER — FENTANYL CITRATE (PF) 100 MCG/2ML IJ SOLN
INTRAMUSCULAR | Status: AC
  Administered 2017-11-17: 25 ug via INTRAVENOUS
  Filled 2017-11-17: qty 2

## 2017-11-17 MED ORDER — ROCURONIUM BROMIDE 100 MG/10ML IV SOLN
INTRAVENOUS | Status: DC | PRN
  Administered 2017-11-17: 30 mg via INTRAVENOUS

## 2017-11-17 MED ORDER — OXYCODONE HCL 5 MG PO TABS: 5.0000 mg | ORAL_TABLET | Freq: Four times a day (QID) | ORAL | 0 refills | Status: DC | PRN

## 2017-11-17 MED ORDER — ACETAMINOPHEN 500 MG PO TABS
1000.0000 mg | ORAL_TABLET | ORAL | Status: AC
  Administered 2017-11-17: 1000 mg via ORAL

## 2017-11-17 MED ORDER — PROPOFOL 10 MG/ML IV BOLUS
INTRAVENOUS | Status: DC | PRN
  Administered 2017-11-17: 200 mg via INTRAVENOUS

## 2017-11-17 MED ORDER — FENTANYL CITRATE (PF) 100 MCG/2ML IJ SOLN
INTRAMUSCULAR | Status: DC | PRN
  Administered 2017-11-17: 50 ug via INTRAVENOUS
  Administered 2017-11-17: 100 ug via INTRAVENOUS

## 2017-11-17 MED ORDER — CHLORHEXIDINE GLUCONATE CLOTH 2 % EX PADS: 6.0000 | MEDICATED_PAD | Freq: Once | CUTANEOUS | Status: DC

## 2017-11-17 MED ORDER — FENTANYL CITRATE (PF) 100 MCG/2ML IJ SOLN
INTRAMUSCULAR | Status: AC
  Filled 2017-11-17: qty 2

## 2017-11-17 MED ORDER — METHYLENE BLUE 1 % INJ SOLN
INTRAMUSCULAR | Status: AC
  Filled 2017-11-17: qty 10

## 2017-11-17 MED ORDER — LIDOCAINE HCL (CARDIAC) 20 MG/ML IV SOLN
INTRAVENOUS | Status: DC | PRN
  Administered 2017-11-17: 100 mg via INTRAVENOUS

## 2017-11-17 MED ORDER — GABAPENTIN 300 MG PO CAPS
300.0000 mg | ORAL_CAPSULE | ORAL | Status: AC
  Administered 2017-11-17: 300 mg via ORAL

## 2017-11-17 MED ORDER — BUPIVACAINE-EPINEPHRINE (PF) 0.5% -1:200000 IJ SOLN
INTRAMUSCULAR | Status: AC
  Filled 2017-11-17: qty 30

## 2017-11-17 MED ORDER — MIDAZOLAM HCL 2 MG/2ML IJ SOLN
INTRAMUSCULAR | Status: DC | PRN
  Administered 2017-11-17: 2 mg via INTRAVENOUS

## 2017-11-17 MED ORDER — PHENYLEPHRINE HCL 10 MG/ML IJ SOLN
INTRAMUSCULAR | Status: DC | PRN
  Administered 2017-11-17: 200 ug via INTRAVENOUS
  Administered 2017-11-17 (×2): 100 ug via INTRAVENOUS

## 2017-11-17 MED ORDER — IBUPROFEN 600 MG PO TABS: 600.0000 mg | ORAL_TABLET | Freq: Three times a day (TID) | ORAL | 0 refills | Status: DC | PRN

## 2017-11-17 MED ORDER — GABAPENTIN 300 MG PO CAPS
ORAL_CAPSULE | ORAL | Status: AC
  Administered 2017-11-17: 300 mg via ORAL
  Filled 2017-11-17: qty 1

## 2017-11-17 MED ORDER — METHYLENE BLUE 0.5 % INJ SOLN
INTRAVENOUS | Status: DC | PRN
  Administered 2017-11-17: 5 mL via SUBMUCOSAL

## 2017-11-17 MED ORDER — ACETAMINOPHEN 500 MG PO TABS
ORAL_TABLET | ORAL | Status: AC
  Administered 2017-11-17: 1000 mg via ORAL
  Filled 2017-11-17: qty 2

## 2017-11-17 MED ORDER — LACTATED RINGERS IV SOLN
INTRAVENOUS | Status: DC
  Administered 2017-11-17: 11:00:00 via INTRAVENOUS

## 2017-11-17 MED ORDER — MIDAZOLAM HCL 2 MG/2ML IJ SOLN
INTRAMUSCULAR | Status: AC
  Filled 2017-11-17: qty 2

## 2017-11-17 MED ORDER — TECHNETIUM TC 99M SULFUR COLLOID FILTERED
0.7390 | Freq: Once | INTRAVENOUS | Status: AC | PRN
  Administered 2017-11-17: 0.739 via INTRADERMAL

## 2017-11-17 MED ORDER — FENTANYL CITRATE (PF) 100 MCG/2ML IJ SOLN
25.0000 ug | INTRAMUSCULAR | Status: DC | PRN
  Administered 2017-11-17 (×2): 25 ug via INTRAVENOUS

## 2017-11-17 SURGICAL SUPPLY — 46 items
ADH SKN CLS APL DERMABOND .7 (GAUZE/BANDAGES/DRESSINGS) ×2
BLADE SURG 15 STRL LF DISP TIS (BLADE) ×1 IMPLANT
BLADE SURG 15 STRL SS (BLADE) ×3
CANISTER SUCT 1200ML W/VALVE (MISCELLANEOUS) ×3 IMPLANT
CHLORAPREP W/TINT 26ML (MISCELLANEOUS) ×3 IMPLANT
CLIP VESOCCLUDE SM WIDE 6/CT (CLIP) ×3 IMPLANT
CNTNR SPEC 2.5X3XGRAD LEK (MISCELLANEOUS) ×2
CONT SPEC 4OZ STER OR WHT (MISCELLANEOUS) ×4
CONT SPEC 4OZ STRL OR WHT (MISCELLANEOUS) ×2
CONTAINER SPEC 2.5X3XGRAD LEK (MISCELLANEOUS) ×2 IMPLANT
COVER PROBE FLX POLY STRL (MISCELLANEOUS) ×3 IMPLANT
DERMABOND ADVANCED (GAUZE/BANDAGES/DRESSINGS) ×4
DERMABOND ADVANCED .7 DNX12 (GAUZE/BANDAGES/DRESSINGS) ×2 IMPLANT
DEVICE DUBIN SPECIMEN MAMMOGRA (MISCELLANEOUS) ×3 IMPLANT
DRAPE LAPAROTOMY 100X77 ABD (DRAPES) ×3 IMPLANT
DRAPE SHEET LG 3/4 BI-LAMINATE (DRAPES) ×2 IMPLANT
ELECT CAUTERY BLADE 6.4 (BLADE) ×3 IMPLANT
ELECT REM PT RETURN 9FT ADLT (ELECTROSURGICAL) ×3
ELECTRODE REM PT RTRN 9FT ADLT (ELECTROSURGICAL) ×1 IMPLANT
GAUZE FLUFF 18X24 1PLY STRL (GAUZE/BANDAGES/DRESSINGS) ×3 IMPLANT
GLOVE SURG SYN 7.0 (GLOVE) ×3 IMPLANT
GLOVE SURG SYN 7.0 PF PI (GLOVE) ×1 IMPLANT
GLOVE SURG SYN 7.5  E (GLOVE) ×2
GLOVE SURG SYN 7.5 E (GLOVE) ×1 IMPLANT
GLOVE SURG SYN 7.5 PF PI (GLOVE) ×1 IMPLANT
GOWN STRL REUS W/ TWL LRG LVL3 (GOWN DISPOSABLE) ×1 IMPLANT
GOWN STRL REUS W/TWL LRG LVL3 (GOWN DISPOSABLE) ×3
KIT RM TURNOVER STRD PROC AR (KITS) ×3 IMPLANT
LABEL OR SOLS (LABEL) ×3 IMPLANT
NDL FILTER BLUNT 18X1 1/2 (NEEDLE) ×1 IMPLANT
NDL HYPO 25X1 1.5 SAFETY (NEEDLE) ×1 IMPLANT
NEEDLE FILTER BLUNT 18X 1/2SAF (NEEDLE) ×2
NEEDLE FILTER BLUNT 18X1 1/2 (NEEDLE) ×1 IMPLANT
NEEDLE HYPO 22GX1.5 SAFETY (NEEDLE) ×3 IMPLANT
NEEDLE HYPO 25X1 1.5 SAFETY (NEEDLE) ×3 IMPLANT
PACK BASIN MINOR ARMC (MISCELLANEOUS) ×3 IMPLANT
SLEVE PROBE SENORX GAMMA FIND (MISCELLANEOUS) ×3 IMPLANT
SUT MNCRL 4-0 (SUTURE) ×6
SUT MNCRL 4-0 27XMFL (SUTURE) ×2
SUT SILK 3 0 SH 30 (SUTURE) ×3 IMPLANT
SUT VIC AB 3-0 SH 27 (SUTURE) ×6
SUT VIC AB 3-0 SH 27X BRD (SUTURE) ×2 IMPLANT
SUTURE MNCRL 4-0 27XMF (SUTURE) ×2 IMPLANT
SYR 10ML LL (SYRINGE) ×6 IMPLANT
SYR BULB IRRIG 60ML STRL (SYRINGE) ×3 IMPLANT
WATER STERILE IRR 1000ML POUR (IV SOLUTION) ×3 IMPLANT

## 2017-11-17 NOTE — Discharge Instructions (Signed)

## 2017-11-17 NOTE — Interval H&P Note (Signed)
History and Physical Interval Note:  11/17/2017 11:01 AM  Holly Bennett  has presented today for surgery, with the diagnosis of left breast invasive cancer  The various methods of treatment have been discussed with the patient and family. After consideration of risks, benefits and other options for treatment, the patient has consented to  Procedure(s): BREAST LUMPECTOMY WITH NEEDLE LOCALIZATION (Left) AXILLARY SENTINEL NODE BIOPSY (Left) as a surgical intervention .  The patient's history has been reviewed, patient examined, no change in status, stable for surgery.  I have reviewed the patient's chart and labs.  Questions were answered to the patient's satisfaction.     Natale Thoma

## 2017-11-17 NOTE — Transfer of Care (Signed)
Immediate Anesthesia Transfer of Care Note  Patient: Holly Bennett  Procedure(s) Performed: BREAST LUMPECTOMY WITH NEEDLE LOCALIZATION (Left ) AXILLARY SENTINEL NODE BIOPSY (Left )  Patient Location: PACU  Anesthesia Type:General  Level of Consciousness: alert   Airway & Oxygen Therapy: Patient Spontanous Breathing  Post-op Assessment: Report given to RN  Post vital signs: Reviewed and stable  Last Vitals:  Vitals:   11/17/17 0915  BP: (!) 148/82  Pulse: 77  Resp: 17  Temp: (!) 36.1 C  SpO2: 100%    Last Pain:  Vitals:   11/17/17 0915  TempSrc: Tympanic         Complications: No apparent anesthesia complications

## 2017-11-17 NOTE — Op Note (Signed)
  Procedure Date:  11/17/2017  Pre-operative Diagnosis:  Left breast cancer  Post-operative Diagnosis:  Left breast cancer  Procedure:  Left breast wire-localized lumpectomy and sentinel lymph node biopsy  Surgeon:  [name], MD  Anesthesia:  General endotracheal  Estimated Blood Loss:  10 ml  Specimens:  Sentinel node 1, 2, and 3.  Wire localized left breast mass, new left breast medial margin and new left breast lateral margin.  Complications:  None  Indications for Procedure:  This is a 52 y.o. female who presents with left breast cancer.  The risks of bleeding, infection, injury to surrounding structures, hematoma, seroma, open wound, cosmetic deformity, and the need for further surgery were all discussed with the patient and was willing to proceed.  Prior to this procedure, the patient had undergone wire localization and sentinel lymphoscintigraphy.  Description of Procedure: The patient was correctly identified in the preoperative area and brought into the operating room.  The patient was placed supine with VTE prophylaxis in place.  Appropriate time-outs were performed.  Anesthesia was induced and the patient was intubated.  Appropriate antibiotics were infused.  A visual dye was injected in the left periareolar region under aseptic conditions. The left chest and axilla were prepped and draped in usual sterile fashion.  Then using the hand-held probe an area of high counts was identified in the axilla, an incision was made and direction by the probe aided in dissection of 3 lymph nodes.  The first two lymph nodes were hot and blue and the third was hot only.  These were sent to pathology.  The cavity was irrigated and hemostasis was assured with electrocautery.  Local anesthetic was infiltrated into the skin and subcutaneous tissue of the cavity.  The wound was then closed in two layers with 3-0 Vicryl and 4-0 Monocryl and sealed with DermaBond.  Attention was turned to the  needle localization site where an incision was made 2 cm superior to the areola. Elecrocautery was used for dissection and the needle was found and brought out under the skin through the incision.  The wire was then used as a guide and a core of breast tissue was excised around the wire and sent to pathology.  2-0 silk suture was used to mark the specimen short superior and long lateral.  The specimen including the wire was sent to radiology which confirmed an intact wire and prior biopsy site clip.  Pathology confirmed grossly negative but close margins of the medial and lateral aspects and more tissue was resected at those levels.  New medial margin labeled short superior and long new medial margin.  The new lateral margin was labeled short superior and long new lateral margin. The cavity was irrigated and hemostasis was assured with electrocautery.  Local anesthetic was infiltrated into the skin and subcutaneous tissue of the cavity.  The wound was then closed in two layers with 3-0 Vicryl and 4-0 Monocryl and sealed with DermaBond.  The patient was emerged from anesthesia and extubated and brought to the recovery room for further management.  The patient tolerated the procedure well and all counts were correct at the end of the case.   [name], MD

## 2017-11-17 NOTE — Anesthesia Post-op Follow-up Note (Signed)
Anesthesia QCDR form completed.        

## 2017-11-17 NOTE — Anesthesia Preprocedure Evaluation (Signed)
Anesthesia Evaluation  Patient identified by MRN, date of birth, ID band Patient awake    Reviewed: Allergy & Precautions, NPO status , Patient's Chart, lab work & pertinent test results  Airway Mallampati: II  TM Distance: >3 FB     Dental  (+) Teeth Intact   Pulmonary neg pulmonary ROS,    Pulmonary exam normal        Cardiovascular negative cardio ROS Normal cardiovascular exam+ Valvular Problems/Murmurs      Neuro/Psych negative neurological ROS  negative psych ROS   GI/Hepatic negative GI ROS, Neg liver ROS,   Endo/Other  negative endocrine ROS  Renal/GU stones     Musculoskeletal  (+) Arthritis , Osteoarthritis,    Abdominal Normal abdominal exam  (+)   Peds  Hematology negative hematology ROS (+)   Anesthesia Other Findings Past Medical History: 01/21/2011: Acute right flank pain 01/21/2011: Back pain 10/23/2017: Cancer of central portion of left female breast ([place])     Comment:  also skin cancer on calf(5 yrs ago) and cheek (august               2018) No date: Heart murmur     Comment:  found once many years ago No date: 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 mention of complication     Comment:  genital No date: History of kidney stones     Comment:  passed twice 06/10/2017: Personal history of other malignant neoplasm of skin No date: Viral warts, unspecified  Reproductive/Obstetrics                             Anesthesia Physical Anesthesia Plan  ASA: II  Anesthesia Plan: General   Post-op Pain Management:    Induction: Intravenous  PONV Risk Score and Plan:   Airway Management Planned: LMA and Oral ETT  Additional Equipment:   Intra-op Plan:   Post-operative Plan: Extubation in OR  Informed Consent: I have reviewed the patients History and Physical, chart, labs and discussed the procedure including the risks, benefits and alternatives for the proposed anesthesia with the  patient or authorized representative who has indicated his/her understanding and acceptance.   Dental advisory given  Plan Discussed with: CRNA and Surgeon  Anesthesia Plan Comments:         Anesthesia Quick Evaluation

## 2017-11-17 NOTE — Anesthesia Procedure Notes (Signed)
Procedure Name: Intubation Performed by: Philbert Riser, CRNA Pre-anesthesia Checklist: Patient identified Patient Re-evaluated:Patient Re-evaluated prior to induction Oxygen Delivery Method: Circle system utilized and Simple face mask Preoxygenation: Pre-oxygenation with 100% oxygen Induction Type: IV induction Ventilation: Mask ventilation without difficulty Laryngoscope Size: Mac and 3 Grade View: Grade I Tube type: Oral Tube size: 7.0 mm Number of attempts: 2

## 2017-11-17 NOTE — Anesthesia Postprocedure Evaluation (Signed)
Anesthesia Post Note  Patient: Holly Bennett  Procedure(s) Performed: BREAST LUMPECTOMY WITH NEEDLE LOCALIZATION (Left ) AXILLARY SENTINEL NODE BIOPSY (Left )  Patient location during evaluation: PACU Anesthesia Type: General Level of consciousness: awake and alert and oriented Pain management: pain level controlled Vital Signs Assessment: post-procedure vital signs reviewed and stable Respiratory status: spontaneous breathing Cardiovascular status: blood pressure returned to baseline Anesthetic complications: no     Last Vitals:  Vitals:   11/17/17 1419 11/17/17 1421  BP: 139/89   Pulse: 88 77  Resp: 17 15  Temp:    SpO2: 100% 100%    Last Pain:  Vitals:   11/17/17 1421  TempSrc:   PainSc: 4                  Jayle Solarz

## 2017-11-18 ENCOUNTER — Encounter: Payer: Self-pay | Admitting: Surgery

## 2017-11-19 ENCOUNTER — Ambulatory Visit: Payer: 59

## 2017-11-19 LAB — SURGICAL PATHOLOGY

## 2017-11-25 ENCOUNTER — Encounter: Payer: Self-pay | Admitting: Oncology

## 2017-12-03 NOTE — Progress Notes (Signed)
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  INTERVAL HISTORY: Patient returns to clinic to discuss her final pathology results and additional treatment planning.  She underwent her lumpectomy on November 17, 2017.  She continues to have breast tenderness, but otherwise feels well. She has no neurologic complaints.  She denies any recent fevers or illnesses.  She has a good appetite and denies weight loss.  She denies any pain.  She has no chest pain or shortness of breath.  She denies any nausea, vomiting, constipation, or diarrhea.  She has no urinary complaints.  Patient offers no further specific complaints today.  REVIEW OF SYSTEMS:   Review of Systems  Constitutional: Negative.  Negative for fever, malaise/fatigue and weight loss.  Respiratory: Negative.  Negative for cough and shortness of breath.   Cardiovascular: Negative.  Negative for chest pain and leg swelling.  Gastrointestinal: Negative.  Negative for abdominal pain.  Genitourinary: Negative.   Musculoskeletal: Negative.   Skin: Negative.  Negative for rash.  Neurological: Negative.  Negative for weakness.  Psychiatric/Behavioral: The patient is nervous/anxious.     As per HPI. Otherwise, a complete review of systems is negative.  PAST MEDICAL HISTORY: Past Medical History:  Diagnosis Date  . Acute right flank pain 01/21/2011  . Back pain 01/21/2011  . Cancer of central portion of left female breast ([place]) 10/23/2017   also skin cancer on calf(5 yrs ago) and cheek (august 2018)  . Heart murmur    found once many years ago  . 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 History of kidney  stones    passed twice  . Personal history of other malignant neoplasm of skin 06/10/2017  . Viral warts, unspecified     PAST SURGICAL HISTORY: Past Surgical History:  Procedure Laterality Date  . APPENDECTOMY  1983  . AXILLARY SENTINEL NODE BIOPSY Left 11/17/2017   Procedure: AXILLARY SENTINEL NODE BIOPSY;  Surgeon: Olean Ree, MD;  Location: [facility] ORS;  Service: General;  Laterality: Left;  . BREAST LUMPECTOMY WITH NEEDLE LOCALIZATION Left 11/17/2017   Procedure: BREAST LUMPECTOMY WITH NEEDLE LOCALIZATION;  Surgeon: Olean Ree, MD;  Location: [facility] ORS;  Service: General;  Laterality: Left;  . DILATION AND CURETTAGE OF UTERUS     x4  . MOHS SURGERY  2018  . WISDOM TOOTH EXTRACTION      FAMILY HISTORY: Family History  Problem Relation Age of Onset  . Breast cancer Mother 42       currently 43  . Hypertension Father   . Lung cancer Father 33       smoker; deceased 28  . Breast cancer Other        mat grandmother's sister    ADVANCED DIRECTIVES (Y/N):  N  HEALTH MAINTENANCE: Social History   Tobacco Use  . Smoking status: Never Smoker  . Smokeless tobacco: Never Used  Substance Use Topics  . Alcohol use: Yes    Comment: Socially on weekends  . Drug use: No     Colonoscopy:  PAP:  Bone density:  Lipid panel:  Allergies  Allergen Reactions  . Penicillins Rash and Other (See Comments)    Has patient had a PCN reaction causing  immediate rash, facial/tongue/throat swelling, SOB or lightheadedness with hypotension: Unknown Has patient had a PCN reaction causing severe rash involving mucus membranes or skin necrosis: No Has patient had a PCN reaction that required hospitalization: No Has patient had a PCN reaction occurring within the last 10 years: No If all of the above answers are "NO", then may proceed with Cephalosporin use.     Current Outpatient Medications  Medication Sig Dispense Refill  . Ascorbic Acid (VITAMIN C) 500 MG tablet Take 500 mg by mouth  daily.      . Calcium Carbonate-Vitamin D (CALCIUM 600+D) 600-400 MG-UNIT per tablet Take 1 tablet by mouth daily.      . Glucosamine 500 MG CAPS Take 500 mg by mouth daily.     . Multiple Vitamin (MULTIVITAMIN) tablet Take 1 tablet by mouth daily.      Marland Kitchen sulfamethoxazole-trimethoprim (BACTRIM DS,SEPTRA DS) 800-160 MG tablet Take 1 tablet by mouth 2 (two) times daily. 14 tablet 0  . vitamin B-12 (CYANOCOBALAMIN) 100 MCG tablet Take 100 mcg by mouth daily.     No current facility-administered medications for this visit.     OBJECTIVE: There were no vitals filed for this visit.   There is no height or weight on file to calculate BMI.    ECOG FS:0 - Asymptomatic  General: Well-developed, well-nourished, no acute distress. Eyes: Pink conjunctiva, anicteric sclera. Breast: Well-healing surgical scar on left breast. Lungs: Clear to auscultation bilaterally. Heart: Regular rate and rhythm. No rubs, murmurs, or gallops. Abdomen: Soft, nontender, nondistended. No organomegaly noted, normoactive bowel sounds. Musculoskeletal: No edema, cyanosis, or clubbing. Neuro: Alert, answering all questions appropriately. Cranial nerves grossly intact. Skin: No rashes or petechiae noted. Psych: Normal affect. Lymphatics: No cervical, calvicular, axillary or inguinal LAD.   LAB RESULTS:  No results found for: NA, K, CL, CO2, GLUCOSE, BUN, CREATININE, CALCIUM, PROT, ALBUMIN, AST, ALT, ALKPHOS, BILITOT, GFRNONAA, GFRAA  No results found for: WBC, NEUTROABS, HGB, HCT, MCV, PLT   STUDIES: Nm Sentinel Node Injection  Result Date: 11/17/2017 CLINICAL DATA:  Left breast cancer. EXAM: NUCLEAR MEDICINE BREAST LYMPHOSCINTIGRAPHY TECHNIQUE: The procedure, risks (including but not limited to bleeding, infection, organ damage ), benefits, and alternatives were explained to the patient. Questions regarding the procedure were encouraged and answered. The patient understands and consents to the procedure. Laterality  was confirmed and marked on the skin. Skin prepped with chlorhexidine. Intradermal injection of radiopharmaceutical was performed at the 12 o'clock, 3 o'clock, 6 o'clock, and 9 o'clock positions around the left nipple. The patient was then sent to the operating room for sentinel node(s) identification and removal by the surgeon. RADIOPHARMACEUTICALS:  Total of 0.739 mCi Millipore-filtered Technetium-26msulfur colloid, injected in four aliquots . IMPRESSION: Uncomplicated intradermal injection of a total of 0.739 mCi Technetium-977mulfur colloid for purposes of sentinel node identification. Electronically Signed   By: D Lucrezia Europe.D.   On: 11/17/2017 10:14   Mm Breast Surgical Specimen  Result Date: 11/17/2017 CLINICAL DATA:  Biopsy proven invasive mammary carcinoma in the left breast. Status post wire localization. EXAM: SPECIMEN RADIOGRAPH OF THE LEFT BREAST COMPARISON:  Previous exam(s). FINDINGS: Status post excision of the left breast. The wire tip and biopsy marker clip are present and are marked for pathology. Distortion appears to extend to the margin of the specimen. Findings were relayed to Dr. PiHampton AbbotSpecimen is being sent to pathology and correlation is recommended. IMPRESSION: Specimen radiograph of the left breast. Electronically Signed   By: DiLillia Mountain  M.D.   On: 11/17/2017 13:13   Mm Lt Plc Breast Loc Dev   1st Lesion  Inc Mammo Guide  Result Date: 11/17/2017 CLINICAL DATA:  Biopsy proven invasive mammary carcinoma in the left breast. EXAM: NEEDLE LOCALIZATION OF THE LEFT BREAST WITH MAMMO GUIDANCE COMPARISON:  Previous exams. FINDINGS: Patient presents for needle localization prior to surgery. I met with the patient and we discussed the procedure of needle localization including benefits and alternatives. We discussed the high likelihood of a successful procedure. We discussed the risks of the procedure, including infection, bleeding, tissue injury, and further surgery. Informed, written  consent was given. The usual time-out protocol was performed immediately prior to the procedure. Using mammographic guidance, sterile technique, 1% lidocaine and a #9 modified Kopans needle, the wing shaped clip was localized using superior to inferior approach. The images were marked for Dr. Hampton Abbot. IMPRESSION: Needle localization of the left breast. No apparent complications. Electronically Signed   By: Lillia Mountain M.D.   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  PLAN:    1. 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  She also had consultation with radiation oncology today and has simulation set up for adjuvant XRT on December 16, 2017.  Oncotype score is pending at time of dictation.  If she returns high risk, patient will return prior to her simulation to discuss the results and whether or not to pursue adjuvant chemotherapy.  If her Oncotype is low risk, proceed with radiation as planned and return to clinic in 3 months at the end of radiation to initiate an aromatase inhibitor. 2.  Genetic testing: Given patient's age and family history of breast cancer in her mother at approximately the age of 56, have referred patient for genetic testing.    Approximately 30 minutes was spent in discussion of which greater than 50% was consultation.   Patient expressed understanding and was in agreement with this plan. She also understands that She can call clinic at any time with any questions, concerns, or complaints.   Cancer Staging Malignant neoplasm of left breast in female, estrogen receptor positive ([place]) Staging form: Breast, AJCC 8th Edition - Clinical stage from 10/23/2017: Stage IA (cT1a, cN0, cM0, G2, ER: Positive, PR: Positive, HER2: Negative) - Signed  by Lloyd Huger, MD on 12/09/2017   Lloyd Huger, MD   12/09/2017 10:54 AM

## 2017-12-06 ENCOUNTER — Ambulatory Visit
Admission: RE | Admit: 2017-12-06 | Discharge: 2017-12-06 | Disposition: A | Discharge status: Home / Self Care | Payer: 59 | Source: Ambulatory Visit | Attending: Radiation Oncology | Admitting: Radiation Oncology

## 2017-12-06 ENCOUNTER — Encounter: Payer: Self-pay | Admitting: Radiation Oncology

## 2017-12-06 ENCOUNTER — Inpatient Hospital Stay: Payer: 59 | Attending: Oncology | Admitting: Oncology

## 2017-12-06 ENCOUNTER — Other Ambulatory Visit: Payer: Self-pay

## 2017-12-06 VITALS — BP 159/96 | HR 78 | Temp 97.3°F | Resp 20 | Wt 153.8 lb

## 2017-12-06 DIAGNOSIS — M549 Dorsalgia, unspecified: Secondary | ICD-10-CM | POA: Diagnosis not present

## 2017-12-06 DIAGNOSIS — R011 Cardiac murmur, unspecified: Secondary | ICD-10-CM | POA: Insufficient documentation

## 2017-12-06 DIAGNOSIS — D0512 Intraductal carcinoma in situ of left breast: Secondary | ICD-10-CM

## 2017-12-06 DIAGNOSIS — Z170 Estrogen receptor positive status [ER+]: Secondary | ICD-10-CM | POA: Insufficient documentation

## 2017-12-06 DIAGNOSIS — Z85828 Personal history of other malignant neoplasm of skin: Secondary | ICD-10-CM | POA: Insufficient documentation

## 2017-12-06 DIAGNOSIS — Z8619 Personal history of other infectious and parasitic diseases: Secondary | ICD-10-CM | POA: Insufficient documentation

## 2017-12-06 DIAGNOSIS — Z79899 Other long term (current) drug therapy: Secondary | ICD-10-CM | POA: Insufficient documentation

## 2017-12-06 DIAGNOSIS — Z803 Family history of malignant neoplasm of breast: Secondary | ICD-10-CM | POA: Insufficient documentation

## 2017-12-06 DIAGNOSIS — Z801 Family history of malignant neoplasm of trachea, bronchus and lung: Secondary | ICD-10-CM | POA: Diagnosis not present

## 2017-12-06 DIAGNOSIS — C50112 Malignant neoplasm of central portion of left female breast: Secondary | ICD-10-CM | POA: Diagnosis not present

## 2017-12-06 DIAGNOSIS — Z87442 Personal history of urinary calculi: Secondary | ICD-10-CM | POA: Insufficient documentation

## 2017-12-06 NOTE — Consult Note (Signed)
NEW PATIENT EVALUATION  Name: Holly Bennett  MRN: [id number]  Date:   12/06/2017     DOB: Sep 03, 1966   This 52 y.o. 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  REFERRING PHYSICIAN: Princess Bruins, MD  CHIEF COMPLAINT:  Chief Complaint  Patient presents with  . 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   PREVIOUS INVESTIGATIONS:  Pathology reports reviewed Mammogram and ultrasound reviewed Clinical notes reviewed  HPI: Patient is a 52 year old female who presented with an abnormal mammogram of her left breast showing an area at the 12:00 position 1 cm from the nipple measuring approximately 5 mm confirmed on ultrasound. She underwent 1 ultrasound-guided biopsy showing invasive mammary carcinoma.  She then underwent wide local excision and sentinel node biopsy.  Tumor was ER/PR positive HER-2/neu negative on fish. Tumor measured 8 mm overall grade 2. Margins were clear but 0.5 mm. 3 sentinel lymph nodes were negative for metastatic disease. She's had some scabbing over her scar otherwise she is tolerated her surgery well. She is seeing medical oncology today and Oncotype DX may be ordered. She specifically denies breast tenderness cough or bone pain. She is a Physiological scientist.  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    PAST SURGICAL HISTORY:  Past Surgical History:   Procedure Laterality Date  . APPENDECTOMY  1983  . AXILLARY SENTINEL NODE BIOPSY Left 11/17/2017   Procedure: AXILLARY SENTINEL NODE BIOPSY;  Surgeon: Olean Ree, MD;  Location: [facility] ORS;  Service: General;  Laterality: Left;  . BREAST LUMPECTOMY WITH NEEDLE LOCALIZATION Left 11/17/2017   Procedure: BREAST LUMPECTOMY WITH NEEDLE LOCALIZATION;  Surgeon: Olean Ree, MD;  Location: [facility] ORS;  Service: General;  Laterality: Left;  . DILATION AND CURETTAGE OF UTERUS     x4  . MOHS SURGERY  2018  . WISDOM TOOTH EXTRACTION      FAMILY HISTORY: family history includes Breast cancer in her other; Breast cancer (age of onset: 79) in her mother; Hypertension in her father; Lung cancer (age of onset: 29) in her father.  SOCIAL HISTORY:  reports that  has never smoked. she has never used smokeless tobacco. She reports that she drinks alcohol. She reports that she does not use drugs.  ALLERGIES: Penicillins  MEDICATIONS:  Current Outpatient Medications  Medication Sig Dispense Refill  . Ascorbic Acid (VITAMIN C) 500 MG tablet Take 500 mg by mouth daily.      . Calcium Carbonate-Vitamin D (CALCIUM 600+D) 600-400 MG-UNIT per tablet Take 1 tablet by mouth daily.      . Glucosamine 500 MG CAPS Take 500 mg by mouth daily.     Marland Kitchen ibuprofen (ADVIL,MOTRIN) 200 MG tablet Take 400 mg by mouth every 6 (six) hours as needed for headache or moderate pain.    Marland Kitchen ibuprofen (ADVIL,MOTRIN) 600 MG tablet Take 1 tablet (600 mg total) by mouth every 8 (eight) hours as needed for fever, mild pain or moderate pain. 30 tablet  0  . Multiple Vitamin (MULTIVITAMIN) tablet Take 1 tablet by mouth daily.      Marland Kitchen oxyCODONE (OXY IR/ROXICODONE) 5 MG immediate release tablet Take 1-2 tablets (5-10 mg total) by mouth every 6 (six) hours as needed for severe pain. 30 tablet 0  . vitamin B-12 (CYANOCOBALAMIN) 100 MCG tablet Take 100 mcg by mouth daily.     No current facility-administered medications for this encounter.     ECOG  PERFORMANCE STATUS:  0 - Asymptomatic  REVIEW OF SYSTEMS:  Patient denies any weight loss, fatigue, weakness, fever, chills or night sweats. Patient denies any loss of vision, blurred vision. Patient denies any ringing  of the ears or hearing loss. No irregular heartbeat. Patient denies heart murmur or history of fainting. Patient denies any chest pain or pain radiating to her upper extremities. Patient denies any shortness of breath, difficulty breathing at night, cough or hemoptysis. Patient denies any swelling in the lower legs. Patient denies any nausea vomiting, vomiting of blood, or coffee ground material in the vomitus. Patient denies any stomach pain. Patient states has had normal bowel movements no significant constipation or diarrhea. Patient denies any dysuria, hematuria or significant nocturia. Patient denies any problems walking, swelling in the joints or loss of balance. Patient denies any skin changes, loss of hair or loss of weight. Patient denies any excessive worrying or anxiety or significant depression. Patient denies any problems with insomnia. Patient denies excessive thirst, polyuria, polydipsia. Patient denies any swollen glands, patient denies easy bruising or easy bleeding. Patient denies any recent infections, allergies or URI. Patient "s visual fields have not changed significantly in recent time.    PHYSICAL EXAM: BP (!) 159/96   Pulse 78   Temp (!) 97.3 F (36.3 C)   Resp 20   Wt 153 lb 12.3 oz (69.7 kg)   BMI 26.39 kg/m  Left breast is status post wide local excision which is healing well except for some scab formation over her lumpectomy site. No dominant mass or nodularity is noted in either breast in 2 positions examined. No axillary or supraclavicular adenopathy is appreciated. Well-developed well-nourished patient in NAD. HEENT reveals PERLA, EOMI, discs not visualized.  Oral cavity is clear. No oral mucosal lesions are identified. Neck is clear without evidence of  cervical or supraclavicular adenopathy. Lungs are clear to A&P. Cardiac examination is essentially unremarkable with regular rate and rhythm without murmur rub or thrill. Abdomen is benign with no organomegaly or masses noted. Motor sensory and DTR levels are equal and symmetric in the upper and lower extremities. Cranial nerves II through XII are grossly intact. Proprioception is intact. No peripheral adenopathy or edema is identified. No motor or sensory levels are noted. Crude visual fields are within normal range.  LABORATORY DATA: Pathology reports reviewed    RADIOLOGY RESULTS: Mammograms and ultrasound reviewed   IMPRESSION: Stage I ER/PR positive HER-2/neu negative invasive mammary carcinoma the left breast status post wide local excision and sentinel node biopsy in 52 year old female  PLAN: At this time I have recommended hypofractionated course of whole breast radiation to her left breast. Would treat over 4 weeks and boost her scar another 1600 cGy using electron beam based on the close margin. Risks and benefits of radiation including skin reaction fatigue alteration of blood counts possible inclusion of superficial lung all discussed in detail with the patient. I will wait medical oncology to see if they are going to do anything as far as a Oncotype DX and I  would not start treatment planning until those results are in and a final decision about systemic chemotherapy has been made. I have set her up for next week tentatively for CT simulation. Patient also will be candidate for antiestrogen therapy after completion of radiation. Patient seems to comprehend my treatment plan well.  I would like to take this opportunity to thank you for allowing me to participate in the care of your patient.Marland Kitchen

## 2017-12-06 NOTE — Progress Notes (Signed)
Patient here today for follow up regarding breast cancer, seen by Dr. Baruch Gouty today.

## 2017-12-08 ENCOUNTER — Encounter: Payer: Self-pay | Admitting: Surgery

## 2017-12-08 ENCOUNTER — Ambulatory Visit (INDEPENDENT_AMBULATORY_CARE_PROVIDER_SITE_OTHER): Payer: 59 | Admitting: Surgery

## 2017-12-08 VITALS — BP 137/81 | HR 73 | Temp 97.8°F | Ht 64.0 in | Wt 154.0 lb

## 2017-12-08 DIAGNOSIS — T8149XA Infection following a procedure, other surgical site, initial encounter: Secondary | ICD-10-CM

## 2017-12-08 DIAGNOSIS — Z09 Encounter for follow-up examination after completed treatment for conditions other than malignant neoplasm: Secondary | ICD-10-CM

## 2017-12-08 MED ORDER — SULFAMETHOXAZOLE-TRIMETHOPRIM 800-160 MG PO TABS: 1.0000 | ORAL_TABLET | Freq: Two times a day (BID) | ORAL | 0 refills | Status: DC

## 2017-12-08 NOTE — Patient Instructions (Signed)
Please continue to wear your sports bra. This will help with applying pressure to the area. Please change your dressing twice daily or as needed. Please apply dry gauze over the area and secure with tape.  Please pick up your Antibiotic and begin taking it today.  Please see your follow up appointment listed below.

## 2017-12-08 NOTE — Progress Notes (Signed)
12/08/2017  HPI: Patient is s/p left breast wire localized lumpectomy and sentinel node biopsy for left breast cancer.  ER/PR positive, HER2 negative.  She has seen Dr. Grayland Ormond and Dr. Baruch Gouty.  Oncotype testing still pending but per patient, may not need chemotherapy.  She is due to have CT simulation in preparation for radiation treatment next week.  However, starting yesterday, she noticed that her lumpectomy wound opened up and started having drainage.  She has noticed more tenderness at the lumpectomy site as well as some erythema surrounding the nipple/areola.  Denies any fevers or chills.  Vital signs: BP 137/81   Pulse 73   Temp 97.8 F (36.6 C) (Oral)   Ht _0  (1.626 m)   Wt 69.9 kg (154 lb)   BMI 26.43 kg/m    Physical Exam: Constitutional: No acute distress Breast:  Left breast wound with erythema surrounding the nipple/areola complex.  There is also a black eschar scab at the lumpectomy incision.  The wound has opened and there is seroma fluid draining on palpation.  No purulent drainage.  Minimal erythema and induration around the incision itself.  The sentinel node incision is clean, dry, healing well.  Dry gauze dressing applied to lumpectomy site  Assessment/Plan: 52 yo female s/p left breast wire localized lumpectomy and sentinel node biopsy.  --discussed with patient that I think this reaction and black eschar is likely due to the blue dye used for the sentinel node.  The erythema is around the nipple/areola complex where the dye is injected, and also close to where the incision was made as well.  There is no evidence of infection but as a precaution, will give the patient a Bactrim course for 7 days.  She will have dressing changes daily and as needed to keep the area dry.  She should continue wearing her sports bra to continue giving compression to the left breast. --patient will follow up next week for another wound check.  Will contact Dr. Baruch Gouty as well so he's  aware of the wound opening and possible infection, as this will delay her radiation treatment.   [name], [place]

## 2017-12-13 ENCOUNTER — Encounter: Payer: Self-pay | Admitting: Surgery

## 2017-12-13 ENCOUNTER — Other Ambulatory Visit: Payer: Self-pay | Admitting: Surgery

## 2017-12-13 ENCOUNTER — Other Ambulatory Visit: Payer: Self-pay

## 2017-12-13 ENCOUNTER — Ambulatory Visit (INDEPENDENT_AMBULATORY_CARE_PROVIDER_SITE_OTHER): Payer: 59 | Admitting: Surgery

## 2017-12-13 ENCOUNTER — Telehealth: Payer: Self-pay

## 2017-12-13 VITALS — BP 160/94 | HR 76 | Temp 97.8°F | Ht 64.0 in | Wt 152.2 lb

## 2017-12-13 DIAGNOSIS — N6489 Other specified disorders of breast: Secondary | ICD-10-CM | POA: Diagnosis not present

## 2017-12-13 DIAGNOSIS — Z7689 Persons encountering health services in other specified circumstances: Secondary | ICD-10-CM | POA: Diagnosis not present

## 2017-12-13 MED ORDER — CIPROFLOXACIN HCL 500 MG PO TABS: 500.0000 mg | ORAL_TABLET | Freq: Two times a day (BID) | ORAL | 0 refills | Status: DC

## 2017-12-13 NOTE — Telephone Encounter (Signed)
Wound culture prepared for pick up. Placed call for courier-Confirmation [id number] spoke with J.Thomas. Placed in white box and hung over door.

## 2017-12-13 NOTE — Patient Instructions (Signed)
Please stop the Antibiotic you are taking now and start the new Cipro.   We have scheduled the Ultrasound appointment 12/16/17 @ 10:00 am at [facility].  Please see your follow up appointment listed below.

## 2017-12-14 ENCOUNTER — Encounter: Payer: Self-pay | Admitting: Surgery

## 2017-12-14 NOTE — Progress Notes (Signed)
S/p left lumpectomy by Dr. Hampton Abbot 1/23 Developed wound infection Now persistent erythema and pain She is on bactrim  PE NAD Breast w cellulitis and there is necrosis of inferior flap. I was able to drained abscess via open wound  ( 20cc) after I expressed her breast . No identifiable drainable collections. No evidence of necrotizing infection  A/p Wound infection abscess w flap necrosis  I will order another U/S for potential aspiration of any remaining collections We will add cipro to her a/bs  F/U 2 days after U/S

## 2017-12-15 ENCOUNTER — Encounter: Payer: 59 | Admitting: Surgery

## 2017-12-15 LAB — SPECIMEN STATUS REPORT

## 2017-12-16 ENCOUNTER — Other Ambulatory Visit: Payer: Self-pay | Admitting: General Surgery

## 2017-12-16 ENCOUNTER — Encounter: Payer: Self-pay | Admitting: General Surgery

## 2017-12-16 ENCOUNTER — Other Ambulatory Visit: Payer: Self-pay | Admitting: Surgery

## 2017-12-16 ENCOUNTER — Ambulatory Visit (INDEPENDENT_AMBULATORY_CARE_PROVIDER_SITE_OTHER): Payer: 59 | Admitting: General Surgery

## 2017-12-16 ENCOUNTER — Ambulatory Visit
Admission: RE | Admit: 2017-12-16 | Discharge: 2017-12-16 | Disposition: A | Discharge status: Home / Self Care | Payer: 59 | Source: Ambulatory Visit | Attending: Surgery | Admitting: Surgery

## 2017-12-16 ENCOUNTER — Ambulatory Visit
Admission: RE | Admit: 2017-12-16 | Discharge: 2017-12-16 | Disposition: A | Discharge status: Home / Self Care | Payer: 59 | Source: Ambulatory Visit | Attending: General Surgery | Admitting: General Surgery

## 2017-12-16 ENCOUNTER — Ambulatory Visit: Payer: 59

## 2017-12-16 ENCOUNTER — Other Ambulatory Visit: Payer: Self-pay

## 2017-12-16 VITALS — BP 121/78 | HR 64 | Temp 97.7°F | Ht 64.0 in | Wt 154.0 lb

## 2017-12-16 DIAGNOSIS — N611 Abscess of the breast and nipple: Secondary | ICD-10-CM | POA: Diagnosis not present

## 2017-12-16 DIAGNOSIS — N6489 Other specified disorders of breast: Secondary | ICD-10-CM

## 2017-12-16 DIAGNOSIS — Z4889 Encounter for other specified surgical aftercare: Secondary | ICD-10-CM

## 2017-12-16 DIAGNOSIS — N644 Mastodynia: Secondary | ICD-10-CM | POA: Diagnosis not present

## 2017-12-16 MED ORDER — SILVER SULFADIAZINE 1 % EX CREA: TOPICAL_CREAM | CUTANEOUS | 1 refills | Status: DC

## 2017-12-16 NOTE — Patient Instructions (Signed)
Please pick up your medicine at the pharmacy and begin using it today.   Please wash the area with soap and water and pat dry. Apply the Silvadene cream twice daily.  We will see you back in the office on Monday.

## 2017-12-16 NOTE — Progress Notes (Signed)
Outpatient Surgical Follow Up  12/16/2017  Holly Bennett is an 52 y.o. female.   Chief Complaint  Patient presents with  . Routine Post Op    Left Breast Lumpectomy 11/17/17 Dr.Piscoya    HPI: 52 year old female returns to follow-up for a left breast infection after lumpectomy.  She underwent an ultrasound-guided aspiration earlier today.  She continues to have pain to the area.  No other changes.  Past Medical History:  Diagnosis Date  . Acute right flank pain 01/21/2011  . Back pain 01/21/2011  . Cancer of central portion of left female breast ([place]) 10/23/2017   also skin cancer on calf(5 yrs ago) and cheek (august 2018)  . Heart murmur    found once many years ago  . 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 History of kidney stones    passed twice  . Personal history of other malignant neoplasm of skin 06/10/2017  . Viral warts, unspecified     Past Surgical History:  Procedure Laterality Date  . APPENDECTOMY  1983  . AXILLARY SENTINEL NODE BIOPSY Left 11/17/2017   Procedure: AXILLARY SENTINEL NODE BIOPSY;  Surgeon: Olean Ree, MD;  Location: [facility] ORS;  Service: General;  Laterality: Left;  . BREAST LUMPECTOMY WITH NEEDLE LOCALIZATION Left 11/17/2017   Procedure: BREAST LUMPECTOMY WITH NEEDLE LOCALIZATION;  Surgeon: Olean Ree, MD;  Location: [facility] ORS;  Service: General;  Laterality: Left;  . DILATION AND CURETTAGE OF UTERUS     x4  . MOHS SURGERY  2018  . WISDOM TOOTH EXTRACTION      Family History  Problem Relation Age of Onset  . Breast cancer Mother 29       currently 59  . Hypertension Father   . Lung cancer Father 104       smoker; deceased 68  . Breast cancer Other        mat grandmother's sister    Social History:  reports that  has never smoked. she has never used smokeless tobacco. She reports that she drinks alcohol. She reports that she does not use drugs.  Allergies:  Allergies  Allergen Reactions  . Penicillins Rash and  Other (See Comments)    Has patient had a PCN reaction causing immediate rash, facial/tongue/throat swelling, SOB or lightheadedness with hypotension: Unknown Has patient had a PCN reaction causing severe rash involving mucus membranes or skin necrosis: No Has patient had a PCN reaction that required hospitalization: No Has patient had a PCN reaction occurring within the last 10 years: No If all of the above answers are "NO", then may proceed with Cephalosporin use.     Medications reviewed.    ROS No significant ROS   BP 121/78   Pulse 64   Temp 97.7 F (36.5 C) (Oral)   Ht 5\' 4"  (1.626 m)   Wt 69.9 kg (154 lb)   BMI 26.43 kg/m   Physical Exam  General: No acute distress Breast: Left breast examined.  Obvious eschar to the previous excision site.  Minimal hyperemia around it.  No spreading erythema.  No evidence of purulence on bandage.   No results found for this or any previous visit (from the past 48 hour(s)). US Breast Ltd Uni Left Inc Axilla  Result Date: 12/16/2017 CLINICAL DATA:  History of LEFT lumpectomy 11/17/2017. Patient had spontaneous opening of the lumpectomy cavity and continues to have fluid from the lumpectomy scar. She is on her 2nd round of antibiotics. LEFT breast remains  painful. EXAM: ULTRASOUND OF THE LEFT BREAST COMPARISON:  11/17/2017 FINDINGS: On physical exam, there is a healing wound in the 12 o'clock location of the LEFT breast. Patient is tender on exam. Targeted ultrasound is performed, showing a hypoechoic collection deep to the scar in the 12:30 o'clock location of the LEFT breast 1 centimeter from the nipple. This collection shows mobile internal debris on real-time evaluation and measures 3.6 x 1.0 x 2.3 centimeters. IMPRESSION: Seroma versus abscess in the UPPER portion of the LEFT breast. RECOMMENDATION: Ultrasound-guided aspiration of LEFT fluid collection. Continued antibiotic treatment. I have discussed the findings and recommendations with  the patient. Results were also provided in writing at the conclusion of the visit. If applicable, a reminder letter will be sent to the patient regarding the next appointment. BI-RADS CATEGORY  2: Benign. Electronically Signed   By: Nolon Nations M.D.   On: 12/16/2017 10:31   US Breast Aspiration Left  Result Date: 12/16/2017 CLINICAL DATA:  Patient presents for ultrasound-guided aspiration of LEFT breast abscess. Culture and sensitivity from recent fluid collection is pending. EXAM: ULTRASOUND GUIDED LEFT BREAST ASPIRATION COMPARISON:  11/17/2017 and earlier PROCEDURE: Using sterile technique, 1% lidocaine, under direct ultrasound visualization, needle aspiration of abscess in the 12:30 o'clock location of the LEFT breast was performed using a LATERAL approach. 5 mL of purulent bloody fluid was aspirated and discarded. IMPRESSION: Ultrasound-guided aspiration of LEFT breast abscess no apparent complications. RECOMMENDATIONS: Patient has a surgical consultation later today. Follow-up LEFT breast ultrasound in 2 weeks. Complete antibiotic course. Electronically Signed   By: Nolon Nations M.D.   On: 12/16/2017 12:06    Assessment/Plan:  1. Aftercare following surgery 52 year old female with an infected eschar from a prior lumpectomy site.  Now status post ultrasound-guided aspiration.  Discussed she should continue and complete her antibiotic therapy as originally prescribed.  She will follow-up with her operative provider on Monday.  Discussed adding Silvadene to her wound care management as she has what appears to be a full-thickness burn/eschar to her excision site.     Clayburn Pert, MD [facility] General Surgeon  12/16/2017,3:39 PM

## 2017-12-16 NOTE — Addendum Note (Signed)
Addended by: Celene Kras on: 12/16/2017 03:49 PM   Modules accepted: Orders

## 2017-12-17 DIAGNOSIS — C50912 Malignant neoplasm of unspecified site of left female breast: Secondary | ICD-10-CM | POA: Diagnosis not present

## 2017-12-17 DIAGNOSIS — Z170 Estrogen receptor positive status [ER+]: Secondary | ICD-10-CM | POA: Diagnosis not present

## 2017-12-19 LAB — AEROBIC CULTURE

## 2017-12-20 ENCOUNTER — Ambulatory Visit (INDEPENDENT_AMBULATORY_CARE_PROVIDER_SITE_OTHER): Payer: 59 | Admitting: Surgery

## 2017-12-20 ENCOUNTER — Encounter: Payer: Self-pay | Admitting: Oncology

## 2017-12-20 ENCOUNTER — Encounter: Payer: 59 | Admitting: Surgery

## 2017-12-20 ENCOUNTER — Encounter: Payer: Self-pay | Admitting: Surgery

## 2017-12-20 VITALS — Wt 152.0 lb

## 2017-12-20 DIAGNOSIS — Z09 Encounter for follow-up examination after completed treatment for conditions other than malignant neoplasm: Secondary | ICD-10-CM

## 2017-12-20 NOTE — Patient Instructions (Signed)
Please continue to change your dressing as often as needed and remember to apply Silvadene every time you change your dressing.  Please give [place] a call in case you have any questions or concerns.

## 2017-12-20 NOTE — Progress Notes (Signed)
12/20/2017  HPI: Patient is s/p left breast wire localized lumpectomy and sentinel node biopsy for left breast cancer on 1/23.  Her postop course was complicated by wound dehiscense with eschar formation, likely due to reaction to the blue dye during node biopsy.  She was started on Silvadene last week by Dr. Adonis Huguenin and she also has completed the abx course started the week prior.  She reports the wound continues to heal well and she's still having some serosanguinous drainage, though also some blue dye remnant staining on the gauze. Denies any fevers, chills.  Vital signs: Wt 68.9 kg (152 lb)   BMI 26.09 kg/m    Physical Exam: Constitutional: No acute distress Breast:  Left breast incision healing better, with eschar now off, with some fibrinous material at the inferior portion of the wound.  This was sharply excised at bedside.  The other side of the wound has healthy granulation tissue.  No evidence of infection.  There is still induration of the skin surrounding the nipple/areola complex, but improved compared to how it was on the last visit with me.  Assessment/Plan: 52 yo female s/p left breast wire-localized lumpectomy and sentinel node biopsy  --sharply excised fibrinous material over inferior portion of wound.  Rest of wound healing well.  There is still a small cavity but healing well overall.   --continue dry gauze dressing changes daily and as needed. --continue silvadene ointment --will follow up next week with me.   [name], [place]

## 2017-12-22 ENCOUNTER — Telehealth: Payer: Self-pay

## 2017-12-22 NOTE — Telephone Encounter (Signed)
Called patient to let her know that her culture results came back and that she was properly treated. I also told her that there was no need for more antibiotics since she was doing better. I also reminded her of her upcoming appointment with Dr. Hampton Abbot. She understood and had no further questions.

## 2017-12-24 ENCOUNTER — Encounter: Payer: Self-pay | Admitting: Oncology

## 2017-12-24 ENCOUNTER — Other Ambulatory Visit: Payer: Self-pay

## 2017-12-27 ENCOUNTER — Inpatient Hospital Stay: Payer: 59 | Attending: Oncology | Admitting: Oncology

## 2017-12-27 ENCOUNTER — Other Ambulatory Visit: Payer: Self-pay

## 2017-12-27 VITALS — BP 137/87 | HR 76 | Temp 97.9°F | Resp 18 | Wt 151.7 lb

## 2017-12-27 DIAGNOSIS — Z85828 Personal history of other malignant neoplasm of skin: Secondary | ICD-10-CM

## 2017-12-27 DIAGNOSIS — Z170 Estrogen receptor positive status [ER+]: Secondary | ICD-10-CM | POA: Diagnosis not present

## 2017-12-27 DIAGNOSIS — Z803 Family history of malignant neoplasm of breast: Secondary | ICD-10-CM | POA: Diagnosis not present

## 2017-12-27 DIAGNOSIS — Z87442 Personal history of urinary calculi: Secondary | ICD-10-CM

## 2017-12-27 DIAGNOSIS — C50112 Malignant neoplasm of central portion of left female breast: Secondary | ICD-10-CM | POA: Diagnosis not present

## 2017-12-27 DIAGNOSIS — S21002A Unspecified open wound of left breast, initial encounter: Secondary | ICD-10-CM

## 2017-12-27 NOTE — Progress Notes (Signed)
Per pt after surgery Jan 23/19 -noted to have blue dye all over scab and incision  Stated incision opened one day prior to see Dr Guilford Shi . Went to see Dr Donella Stade 3 weeks ago -told she had reaction to blue dye. Put on antibiotics . No better and antibiotics switched.She went to see Dr Lulu Riding and infection expressed w one ounce of blue dye per pt. Went to [facility] breast center and more fluid expressed. Antibiotic not effective -and saw Dr Cherylynn Ridges been taking Silvadene topically . Went back to see Dr Gerri Spore week-tissue cut off and behind this tissue is a hole that's draining fluids. ( has appt to see Dr Mervin Kung this Thursday 12/31/17.) Stated concerned w seeing all these diff Drs and wanted to come in today after talking to her RN navigator /Ann and is hoping to be referred to a wound speciliast.

## 2017-12-27 NOTE — Progress Notes (Signed)
Opened in error

## 2017-12-27 NOTE — Progress Notes (Signed)
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 Patient was seen by Dr. Grayland Ormond on 10/25/2017 and it was decided that given the size of the patient's malignancy on ultrasound, a lumpectomy should be performed. She did not wish to undergo mastectomy, therefore she would also require radiation. Additionally she was referred for genetic testing due to her family history. She was referred to Dr. Hampton Abbot for lumpectomy which was conducted on 45/85/9292 which was complicated by a blue dye allergy causing a left breast wound with erythema surrounding the nipple areole. There was also black eschar scab noted at the lumpectomy incision site. The wound had opened and there was a seroma noted with fluid draining on palpation. She was treated with a 7 day course of Bactrim and then Cirpo. She returned to cancer center for further treatment planning with Dr. Grayland Ormond on 12/06/2022 as well as treatment planning with Dr. Donella Stade with radiation. Dr. Donella Stade recommended hypo-fractionated course of whole left breast radiation over 4 weeks and boost to her scar another 1600 cGy using electron-beam based on the close margin. Ocotype score reveal a score of 16- indicating that she is low risk for recurrence and will not require chemotherapy. She will need an Aromatase Inhibitor given she is ER/PR positive at  the competition of radiation.  Interval history- Patient presents today because she would like a referral to the wound clinic for a left breast wound that has been managed by Dr. Hampton Abbot, her surgeon. Patient expresses frustration that wound is not significantly better. She has been on several antibiotics with improvement but patient is worried that "would hole is getting larger and closer to her nipple". Patient states every morning she is able to express "a spoonful of yellow/white drainage" from her breast. She denies any pain to her breast. She denies any infections or recent fevers. Her job requires her to be active, as she is a Physiological scientist and she is worried that she is not able to fully fill her job requirements. She feels that for quicker healing she would like to have a second opinion at a wound care facility. She is wondering if we will make that referral for her today. She continues to apply Silvadene directly to wound daily and keep it covered with a gauze pad.   ECOG FS:0 - Asymptomatic  Review of systems- Review of Systems  Constitutional: Negative.  Negative for chills, fever, malaise/fatigue and weight loss.  HENT: Negative for congestion and ear pain.   Eyes: Negative.  Negative for blurred vision and double vision.  Respiratory: Negative.  Negative for cough, sputum production and shortness of breath.   Cardiovascular: Negative.  Negative for chest pain, palpitations and leg swelling.  Gastrointestinal: Negative.  Negative for abdominal pain, constipation, diarrhea, nausea and vomiting.  Genitourinary: Negative for dysuria, frequency and urgency.  Musculoskeletal: Negative for back pain and falls.  Skin: Negative.  Negative for rash.  Left breast wound   Neurological: Negative.  Negative for weakness and headaches.  Endo/Heme/Allergies: Negative.  Does not bruise/bleed easily.  Psychiatric/Behavioral: Negative.  Negative for depression. The patient is not  nervous/anxious and does not have insomnia.      Current treatment- None at this time. Will not need chemotherapy. Oncotype 16. Low risk of recurrence. Will need radiation once wound is healed.  Allergies  Allergen Reactions  . Blue Dyes (Parenteral) Dermatitis  . Penicillins Rash and Other (See Comments)    Has patient had a PCN reaction causing immediate rash, facial/tongue/throat swelling, SOB or lightheadedness with hypotension: Unknown Has patient had a PCN reaction causing severe rash involving mucus membranes or skin necrosis: No Has patient had a PCN reaction that required hospitalization: No Has patient had a PCN reaction occurring within the last 10 years: No If all of the above answers are "NO", then may proceed with Cephalosporin use.      Past Medical History:  Diagnosis Date  . Acute right flank pain 01/21/2011  . Back pain 01/21/2011  . Cancer of central portion of left female breast ([place]) 10/23/2017   also skin cancer on calf(5 yrs ago) and cheek (august 2018)  . Heart murmur    found once many years ago  . 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 History of kidney stones    passed twice  . Personal history of other malignant neoplasm of skin 06/10/2017  . Viral warts, unspecified      Past Surgical History:  Procedure Laterality Date  . APPENDECTOMY  1983  . AXILLARY SENTINEL NODE BIOPSY Left 11/17/2017   Procedure: AXILLARY SENTINEL NODE BIOPSY;  Surgeon: Olean Ree, MD;  Location: [facility] ORS;  Service: General;  Laterality: Left;  . BREAST LUMPECTOMY WITH NEEDLE LOCALIZATION Left 11/17/2017   Procedure: BREAST LUMPECTOMY WITH NEEDLE LOCALIZATION;  Surgeon: Olean Ree, MD;  Location: [facility] ORS;  Service: General;  Laterality: Left;  . DILATION AND CURETTAGE OF UTERUS     x4  . MOHS SURGERY  2018  . WISDOM TOOTH EXTRACTION      Social History   Socioeconomic History  . Marital status: Married    Spouse name: Not on file  . Number  of children: Not on file  . Years of education: Not on file  . Highest education level: Not on file  Social Needs  . Financial resource strain: Not on file  . Food insecurity - worry: Not on file  . Food insecurity - inability: Not on file  . Transportation needs - medical: Not on file  . Transportation needs - non-medical: Not on file  Occupational History  . Occupation: Clinical research associate at [employer]  . Smoking status: Never Smoker  . Smokeless tobacco: Never Used  Substance and Sexual Activity  . Alcohol use: Yes    Comment: Socially on weekends  . Drug use: No  . Sexual activity: Yes    Partners: Male    Birth control/protection: Pill    Comment: 1st intercourse- 34, partners- 13, married- 2.5 yrs  Other Topics Concern  . Not on file  Social History Narrative  . Not on file    Family History  Problem Relation Age of Onset  . Breast cancer Mother 31       currently 75  . Hypertension Father   . Lung cancer Father 19       smoker; deceased 13  . Breast cancer Other  mat grandmother's sister     Current Outpatient Medications:  .  Ascorbic Acid (VITAMIN C) 500 MG tablet, Take 500 mg by mouth daily.  , Disp: , Rfl:  .  Calcium Carbonate-Vitamin D (CALCIUM 600+D) 600-400 MG-UNIT per tablet, Take 1 tablet by mouth daily.  , Disp: , Rfl:  .  Glucosamine 500 MG CAPS, Take 500 mg by mouth daily. , Disp: , Rfl:  .  Multiple Vitamin (MULTIVITAMIN) tablet, Take 1 tablet by mouth daily.  , Disp: , Rfl:  .  silver sulfADIAZINE (SILVADENE) 1 % cream, Apply to affected area daily, Disp: 50 g, Rfl: 1 .  vitamin B-12 (CYANOCOBALAMIN) 100 MCG tablet, Take 100 mcg by mouth daily., Disp: , Rfl:   Physical exam:  Vitals:   12/27/17 1348  BP: 137/87  Pulse: 76  Resp: 18  Temp: 97.9 F (36.6 C)  TempSrc: Tympanic  Weight: 151 lb 11.2 oz (68.8 kg)   Physical Exam  Constitutional: She is oriented to person, place, and time and well-developed, well-nourished, and in no  distress. Vital signs are normal.  HENT:  Head: Normocephalic and atraumatic.  Eyes: Pupils are equal, round, and reactive to light.  Neck: Normal range of motion.  Cardiovascular: Normal rate, regular rhythm and normal heart sounds.  No murmur heard. Pulmonary/Chest: Effort normal and breath sounds normal. She has no wheezes.    Abdominal: Soft. Normal appearance and bowel sounds are normal. She exhibits no distension. There is no tenderness.  Musculoskeletal: Normal range of motion. She exhibits no edema.  Neurological: She is alert and oriented to person, place, and time. Gait normal.  Skin: Skin is warm and dry. No rash noted.  Psychiatric: Mood, memory, affect and judgment normal.     No flowsheet data found. No flowsheet data found.  No images are attached to the encounter.  US Breast Ltd Uni Left Inc Axilla  Result Date: 12/16/2017 CLINICAL DATA:  History of LEFT lumpectomy 11/17/2017. Patient had spontaneous opening of the lumpectomy cavity and continues to have fluid from the lumpectomy scar. She is on her 2nd round of antibiotics. LEFT breast remains painful. EXAM: ULTRASOUND OF THE LEFT BREAST COMPARISON:  11/17/2017 FINDINGS: On physical exam, there is a healing wound in the 12 o'clock location of the LEFT breast. Patient is tender on exam. Targeted ultrasound is performed, showing a hypoechoic collection deep to the scar in the 12:30 o'clock location of the LEFT breast 1 centimeter from the nipple. This collection shows mobile internal debris on real-time evaluation and measures 3.6 x 1.0 x 2.3 centimeters. IMPRESSION: Seroma versus abscess in the UPPER portion of the LEFT breast. RECOMMENDATION: Ultrasound-guided aspiration of LEFT fluid collection. Continued antibiotic treatment. I have discussed the findings and recommendations with the patient. Results were also provided in writing at the conclusion of the visit. If applicable, a reminder letter will be sent to the patient  regarding the next appointment. BI-RADS CATEGORY  2: Benign. Electronically Signed   By: Nolon Nations M.D.   On: 12/16/2017 10:31   US Breast Aspiration Left  Result Date: 12/16/2017 CLINICAL DATA:  Patient presents for ultrasound-guided aspiration of LEFT breast abscess. Culture and sensitivity from recent fluid collection is pending. EXAM: ULTRASOUND GUIDED LEFT BREAST ASPIRATION COMPARISON:  11/17/2017 and earlier PROCEDURE: Using sterile technique, 1% lidocaine, under direct ultrasound visualization, needle aspiration of abscess in the 12:30 o'clock location of the LEFT breast was performed using a LATERAL approach. 5 mL of purulent bloody fluid was aspirated and  discarded. IMPRESSION: Ultrasound-guided aspiration of LEFT breast abscess no apparent complications. RECOMMENDATIONS: Patient has a surgical consultation later today. Follow-up LEFT breast ultrasound in 2 weeks. Complete antibiotic course. Electronically Signed   By: Nolon Nations M.D.   On: 12/16/2017 12:06     Assessment and plan- Patient is a 52 y.o. female who presents with concerns of enlarging wound from lumpectomy dehiscence due to allergic reaction to blue dye. She is currently not on antibiotics. Using silver Silvadene cream daily. Incision is approximately 2 cm in diameter. Skin surrounding wound is not erythematous No edema present. Patient states wound is getting better but she feels the "hole" is getting larger and closer to her nipple. Hard to distinguish drainage from silvadene cream. She is scheduled to see Dr. Hampton Abbot on Thursday this week. She would like a referral to wound care for second opinion and wound healing alternatives.  1. Referral to wound care sent.  They will call with an appointment for her. Until then continue wound care treatment per Dr. Hampton Abbot.  2. Reviewed Oncotype score with patient. She is at low risk of recurrence of breast cancer with a score of 16. She will not need chemotherapy at this time.  Once wound is healed, patient will likely proceed with radiation with Dr. Baruch Gouty and then see Dr. Grayland Ormond back to began aromatase inhibitor. Case discussed with Dr. Grayland Ormond and he would like to see patient back in approximately 1 month to check wound and see how she is doing. Will get this scheduled.     Visit Diagnosis 1. Breast wound, left, initial encounter   2. Malignant neoplasm of central portion of left breast in female, estrogen receptor positive ([place])     Patient expressed understanding and was in agreement with this plan. She also understands that She can call clinic at any time with any questions, concerns, or complaints.   Greater than 50% was spent in counseling and coordination of care with this patient including but not limited to discussion of the relevant topics above (See A&P) including, but not limited to diagnosis and management of acute and chronic medical conditions.    Faythe Casa, AGNP-C [facility] [place]- [phone] Pager- [phone] 12/27/2017 3:01 PM

## 2017-12-29 ENCOUNTER — Encounter: Payer: 59 | Attending: Internal Medicine | Admitting: Internal Medicine

## 2017-12-29 ENCOUNTER — Other Ambulatory Visit
Admission: RE | Admit: 2017-12-29 | Discharge: 2017-12-29 | Disposition: A | Discharge status: Home / Self Care | Payer: 59 | Source: Ambulatory Visit | Attending: Internal Medicine | Admitting: Internal Medicine

## 2017-12-29 DIAGNOSIS — N644 Mastodynia: Secondary | ICD-10-CM | POA: Diagnosis not present

## 2017-12-29 DIAGNOSIS — T8131XA Disruption of external operation (surgical) wound, not elsewhere classified, initial encounter: Secondary | ICD-10-CM | POA: Diagnosis not present

## 2017-12-29 DIAGNOSIS — S20102A Unspecified superficial injuries of breast, left breast, initial encounter: Secondary | ICD-10-CM | POA: Insufficient documentation

## 2017-12-29 DIAGNOSIS — B9561 Methicillin susceptible Staphylococcus aureus infection as the cause of diseases classified elsewhere: Secondary | ICD-10-CM | POA: Insufficient documentation

## 2017-12-29 DIAGNOSIS — X58XXXA Exposure to other specified factors, initial encounter: Secondary | ICD-10-CM | POA: Diagnosis not present

## 2017-12-29 DIAGNOSIS — I739 Peripheral vascular disease, unspecified: Secondary | ICD-10-CM | POA: Diagnosis not present

## 2017-12-29 DIAGNOSIS — B9689 Other specified bacterial agents as the cause of diseases classified elsewhere: Secondary | ICD-10-CM | POA: Insufficient documentation

## 2017-12-29 DIAGNOSIS — C50112 Malignant neoplasm of central portion of left female breast: Secondary | ICD-10-CM | POA: Diagnosis not present

## 2017-12-29 DIAGNOSIS — T8131XD Disruption of external operation (surgical) wound, not elsewhere classified, subsequent encounter: Secondary | ICD-10-CM | POA: Diagnosis not present

## 2017-12-30 ENCOUNTER — Ambulatory Visit (INDEPENDENT_AMBULATORY_CARE_PROVIDER_SITE_OTHER): Payer: 59 | Admitting: Surgery

## 2017-12-30 ENCOUNTER — Encounter: Payer: Self-pay | Admitting: Surgery

## 2017-12-30 VITALS — BP 166/98 | HR 73 | Temp 97.8°F | Ht 64.0 in | Wt 153.2 lb

## 2017-12-30 DIAGNOSIS — Z09 Encounter for follow-up examination after completed treatment for conditions other than malignant neoplasm: Secondary | ICD-10-CM

## 2017-12-30 NOTE — Progress Notes (Addendum)
Holly Bennett ([id number]) Visit Report for 12/29/2017 Chief Complaint Document Details Patient Name: Holly Bennett. Date of Service: 12/29/2017 9:45 AM Medical Record Number: [id number] Patient Account Number: [id number] Date of Birth/Sex: 23-Sep-1966 (52 y.o. Female) Treating RN: Holly Bennett Primary Care Provider: [name] Other Clinician: Referring Provider: BURNS, Bennett Treating Provider/Extender: Holly Bennett in Treatment: 0 Information Obtained from: Patient Chief Complaint 12/29/17; patient is here for review of the surgical wound on her left breast. She is self-referred Electronic Signature(s) Signed: 12/29/2017 5:10:26 PM By: Linton Ham MD Entered By: Linton Ham on 12/29/2017 11:40:36 Holly Bennett ([id number]) -------------------------------------------------------------------------------- HPI Details Patient Name: Holly Bennett Date of Service: 12/29/2017 9:45 AM Medical Record Number: [id number] Patient Account Number: [id number] Date of Birth/Sex: 06/18/1966 (52 y.o. Female) Treating RN: Holly Bennett Primary Care Provider: [name] Other Clinician: Referring Provider: BURNS, Bennett Treating Provider/Extender: Holly Bennett Weeks in Treatment: 0 History of Present Illness HPI Description: CLINICAL DATA: History of LEFT lumpectomy 11/17/2017. Patient had spontaneous opening of the lumpectomy cavity and continues to have fluid from the lumpectomy scar. She is on her 2nd round of antibiotics. LEFT breast remains painful. o EXAM: ULTRASOUND OF THE LEFT BREAST o COMPARISON: 11/17/2017 o FINDINGS: On physical exam, there is a healing wound in the 12 o'clock location of the LEFT breast. Patient is tender on exam. o Targeted ultrasound is performed, showing a hypoechoic collection deep to the scar in the 12:30 o'clock location of the LEFT breast 1 centimeter from the nipple. This collection shows mobile internal debris on  real-time evaluation and measures 3.6 x 1.0 x 2.3 centimeters. o IMPRESSION: Seroma versus abscess in the UPPER portion of the LEFT breast. o RECOMMENDATION: Ultrasound-guided aspiration of LEFT fluid collection. o Continued antibiotic treatment. o I have discussed the findings and recommendations with the patient. Results were also provided in writing at the conclusion of the visit. If applicable, a reminder letter will be sent to the patient regarding the next appointment. o BI-RADS CATEGORY 2: Benign. o o Electronically Signed By: Holly Bennett M.D. On: 12/16/2017 10:31 [facility] 12/20/2017 o HPI: Patient is s/p left breast wire localized lumpectomy and sentinel node biopsy for left breast cancer on 1/23. Her postop course Holly Bennett ([id number]) was complicated by wound dehiscense with eschar formation, likely due to reaction to the blue dye during node biopsy. She was started on Silvadene last week by Dr. Adonis Bennett and she also has completed the abx course started the week prior. She reports the wound continues to heal well and she's still having some serosanguinous drainage, though also some blue dye remnant staining on the gauze. Denies any fevers, chills. o Vital signs: Wt 68.9 kg (152 lb)  BMI 26.09 kg/mo o Physical Exam: Constitutional: No acute distress Breast: Left breast incision healing better, with eschar now off, with some fibrinous material at the inferior portion of the wound. This was sharply excised at bedside. The other side of the wound has healthy granulation tissue. No evidence of infection. There is still induration of the skin surrounding the nipple/areola complex, but improved compared to how it was on the last visit with me. o Assessment/Plan: 52 yo female s/p left breast wire-localized lumpectomy and sentinel node biopsy o --sharply excised fibrinous material over inferior portion of wound. Rest of wound healing well. There is  still a small cavity but healing well overall. --continue dry gauze dressing changes daily and as needed. --continue silvadene ointment --will follow up next week  with me. o o Holly Bennett, Holly Bennett 12/29/17; relevant information above including her breast ultrasound in last note from her surgeon. 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 She was discovered to have a malignant neoplasm in the central portion of her left breast by routine mammography. She underwent a left breast lumpectomy with axillary node biopsy on 11/17/17; apparently the margins of this were clean. Some postoperative discomfort and redness which she really didn't think was out of the ordinary however 2 weeks later she is able to show [place] a picture of a small area in the surgical incision with a necrotic-looking wound bed.the wound started draining. the wound progressed and dehisced. She had an ultrasound of the left breast on 2/21 and fluid was aspirated that apparently cultured Klebsiella oxytocin. She was given antibiotics although at this point I am not quite sure which antibiotics were prescribed. In any case the wound is had progressive opening. She had some debridement by her surgeon. She has been using Silvadene cream to the wound area. She is not having unbearable pain however she finds it difficult to be able to work. Surgery felt that this might be a reaction to the "blue dye" used for lymph node biopsy. I told the patient I have no experience with this. In any case she is been left with a fairly sizable opening in the left breast currently with a depth of 1.2 cm from 3 to 9:00 3 cm of undermining. She has not been systemically unwell Electronic Signature(s) Signed: 12/29/2017 5:10:26 PM By: Linton Ham MD Entered By: Linton Ham on 12/29/2017 11:45:59 Holly Bennett  ([id number]) -------------------------------------------------------------------------------- Physical Exam Details Patient Name: Holly Bennett Date of Service: 12/29/2017 9:45 AM Medical Record Number: [id number] Patient Account Number: [id number] Date of Birth/Sex: 02-02-1966 (52 y.o. Female) Treating RN: Holly Bennett Primary Care Provider: [name] Other Clinician: Referring Provider: BURNS, Bennett Treating Provider/Extender: Holly Bennett Weeks in Treatment: 0 Constitutional Sitting or standing Blood Pressure is within target range for patient.. Pulse regular and within target range for patient.Marland Kitchen Respirations regular, non-labored and within target range.. Temperature is normal and within the target range for the patient.Marland Kitchen appears in no distress. Eyes Conjunctivae clear. No discharge. Neck Neck supple and symmetrical. No masses or crepitus. Respiratory Respiratory effort is easy and symmetric bilaterally. Rate is normal at rest and on room air.. Cardiovascular Heart rhythm and rate regular, without murmur or gallop.. Chest wound is in the midsection of the left breast just above the left nipple. other than the wound I felt no suspicious areas. Including no nodules, no tender areas and no crepitus. Gastrointestinal (GI) no palpable liver.Marland Kitchen Lymphatic none palpable in the left axilla, left supraclavicular, left infraclavicular or left cervical chains. Psychiatric No evidence of depression, anxiety, or agitation. Calm, cooperative, and communicative. Appropriate interactions and affect.. Notes wound exam; oLeft breast the surgical wound is in the mid part of the left breast just above the nipple. This has depth and also undermines laterally. There is some mild to moderate amount of purulent drainage which I have cultured. Most of the tissue I couldn visualize looked healthy. In particular there was nothing that looked like it needed debridement as far as I could tell.  There was no palpable tenderness no overt abscess. Electronic Signature(s) Signed: 12/29/2017 5:10:26 PM By: Linton Ham MD Entered By: Linton Ham on 12/29/2017 12:06:40 Holly Bennett ([id number]) -------------------------------------------------------------------------------- Physician Orders Details Patient Name: Kandance, Yano  Maija H. Date of Service: 12/29/2017 9:45 AM Medical Record Number: [id number] Patient Account Number: [id number] Date of Birth/Sex: May 28, 1966 (52 y.o. Female) Treating RN: Holly Bennett Primary Care Provider: [name] Other Clinician: Referring Provider: BURNS, Bennett Treating Provider/Extender: Holly Bennett in Treatment: 0 Verbal / Phone Orders: No Diagnosis Coding ICD-10 Coding Code Description T81.31XD Disruption of external operation (surgical) wound, not elsewhere classified, subsequent encounter C50.112 Malignant neoplasm of central portion of left female breast S21.002D Unspecified open wound of left breast, subsequent encounter Wound Cleansing Wound #1 Left Breast o Cleanse wound with mild soap and water Anesthetic (add to Medication List) Wound #1 Left Breast o Topical Lidocaine 4% cream applied to wound bed prior to debridement (In Clinic Only). Primary Wound Dressing Wound #1 Left Breast o Silvercel Non-Adherent Secondary Dressing Wound #1 Left Breast o Boardered Foam Dressing Dressing Change Frequency Wound #1 Left Breast o Change dressing every day. Follow-up Appointments Wound #1 Left Breast o Return Appointment in 1 week. Laboratory o Bacteria identified in Wound by Culture (MICRO) - lEFT bREAST oooo LOINC Code: 3810-1 oooo Convenience Name: Wound culture routine Patient Medications Allergies: penicillin, blue dye (parenteral) Notifications Medication Indication Start End QUETZALI, HEINLE ([id number]) Notifications Medication Indication Start End sulfamethoxazole-trimethoprim wound infection  01/03/2018 DOSE oral 800 mg-160 mg tablet - 1 tablet oral bid for 10 days Electronic Signature(s) Signed: 01/03/2018 7:52:09 AM By: Linton Ham MD Previous Signature: 12/29/2017 11:29:33 AM Version By: Gretta Cool BSN, RN, CWS, Kim RN, BSN Previous Signature: 12/29/2017 5:10:26 PM Version By: Linton Ham MD Entered By: Linton Ham on 01/03/2018 07:52:09 Holly Bennett ([id number]) -------------------------------------------------------------------------------- Problem List Details Patient Name: Holly Bennett. Date of Service: 12/29/2017 9:45 AM Medical Record Number: [id number] Patient Account Number: [id number] Date of Birth/Sex: 1966/06/11 (52 y.o. Female) Treating RN: Holly Bennett Primary Care Provider: [name] Other Clinician: Referring Provider: BURNS, Bennett Treating Provider/Extender: Holly Bennett in Treatment: 0 Active Problems ICD-10 Encounter Code Description Active Date Diagnosis T81.31XD Disruption of external operation (surgical) wound, not elsewhere 12/29/2017 Yes classified, subsequent encounter C50.112 Malignant neoplasm of central portion of left female breast 12/29/2017 Yes S21.002D Unspecified open wound of left breast, subsequent encounter 12/29/2017 Yes Inactive Problems Resolved Problems Electronic Signature(s) Signed: 12/29/2017 5:10:26 PM By: Linton Ham MD Entered By: Linton Ham on 12/29/2017 11:28:05 Holly Bennett ([id number]) -------------------------------------------------------------------------------- Progress Note Details Patient Name: Holly Bennett Date of Service: 12/29/2017 9:45 AM Medical Record Number: [id number] Patient Account Number: [id number] Date of Birth/Sex: Mar 13, 1966 (52 y.o. Female) Treating RN: Holly Bennett Primary Care Provider: [name] Other Clinician: Referring Provider: BURNS, Bennett Treating Provider/Extender: Holly Bennett in Treatment: 0 Subjective Chief  Complaint Information obtained from Patient 12/29/17; patient is here for review of the surgical wound on her left breast. She is self-referred History of Present Illness (HPI) CLINICAL DATA: History of LEFT lumpectomy 11/17/2017. Patient had spontaneous opening of the lumpectomy cavity and continues to have fluid from the lumpectomy scar. She is on her 2nd round of antibiotics. LEFT breast remains painful. EXAM: ULTRASOUND OF THE LEFT BREAST COMPARISON: 11/17/2017 FINDINGS: On physical exam, there is a healing wound in the 12 o'clock location of the LEFT breast. Patient is tender on exam. Targeted ultrasound is performed, showing a hypoechoic collection deep to the scar in the 12:30 o'clock location of the LEFT breast 1 centimeter from the nipple. This collection shows mobile internal debris on real-time evaluation and measures 3.6 x 1.0 x 2.3 centimeters. IMPRESSION: Seroma versus  abscess in the UPPER portion of the LEFT breast. RECOMMENDATION: Ultrasound-guided aspiration of LEFT fluid collection. Continued antibiotic treatment. I have discussed the findings and recommendations with the patient. Results were also provided in writing at the conclusion of the visit. If applicable, a reminder letter will be sent to the patient regarding the next appointment. BI-RADS CATEGORY 2: Benign. Electronically Signed By: Holly Bennett M.D. On: 12/16/2017 10:31 KERENSA, NICKLAS ([id number]) [facility] 12/20/2017 HPI: Patient is s/p left breast wire localized lumpectomy and sentinel node biopsy for left breast cancer on 1/23. Her postop course was complicated by wound dehiscense with eschar formation, likely due to reaction to the blue dye during node biopsy. She was started on Silvadene last week by Dr. Adonis Bennett and she also has completed the abx course started the week prior. She reports the wound continues to heal well and she's still having some serosanguinous drainage, though also  some blue dye remnant staining on the gauze. Denies any fevers, chills. Vital signs: Wt 68.9 kg (152 lb)  BMI 26.09 kg/m Physical Exam: Constitutional: No acute distress Breast: Left breast incision healing better, with eschar now off, with some fibrinous material at the inferior portion of the wound. This was sharply excised at bedside. The other side of the wound has healthy granulation tissue. No evidence of infection. There is still induration of the skin surrounding the nipple/areola complex, but improved compared to how it was on the last visit with me. Assessment/Plan: 52 yo female s/p left breast wire-localized lumpectomy and sentinel node biopsy --sharply excised fibrinous material over inferior portion of wound. Rest of wound healing well. There is still a small cavity but healing well overall. --continue dry gauze dressing changes daily and as needed. --continue silvadene ointment --will follow up next week with me. Holly Bennett, [place] 12/29/17; relevant information above including her breast ultrasound in last note from her surgeon. 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 She was discovered to have a malignant neoplasm in the central portion of her left breast by routine mammography. She underwent a left breast lumpectomy with axillary node biopsy on 11/17/17; apparently the margins of this were clean. Some postoperative discomfort and redness which she really didn't think was out of the ordinary however 2 weeks later she is able to show [place] a picture of a small area in the surgical incision with a necrotic-looking wound bed.the wound started draining. the wound progressed and dehisced. She had an ultrasound of the left breast on 2/21 and fluid was aspirated that apparently cultured Klebsiella oxytocin. She was given antibiotics although at this point I am not quite sure which antibiotics were  prescribed. In any case the wound is had progressive opening. She had some debridement by her surgeon. She has been using Silvadene cream to the wound area. She is not having unbearable pain however she finds it difficult to be able to work. Surgery felt that this might be a reaction to the "blue dye" used for lymph node biopsy. I told the patient I have no experience with this. In any case she is been left with a fairly sizable opening in the left breast currently with a depth of 1.2 cm from 3 to 9:00 3 cm of undermining. She has not been systemically unwell Wound History Patient presents with 1 open wound that has been present for approximately 3 weeks. Patient has been treating wound in the following manner: silvadene. Laboratory tests have  not been performed in the last month. Patient reportedly has not tested positive for an antibiotic resistant organism. Patient reportedly has not tested positive for osteomyelitis. Patient reportedly has not had testing performed to evaluate circulation in the legs. Patient History Information obtained from Patient. Holly, Bennett ([id number]) Allergies penicillin, blue dye (parenteral) Family History Cancer - Mother,Father, Hypertension - Father, Lung Disease - Father, Stroke - Father, No family history of Diabetes, Heart Disease, Hereditary Spherocytosis, Kidney Disease, Seizures, Thyroid Problems, Tuberculosis. Social History Never smoker, Marital Status - Married, Alcohol Use - Moderate - once a week, Drug Use - No History, Caffeine Use - Daily. Medical History Oncologic Patient has history of Received Radiation - havent started yet Review of Systems (ROS) Constitutional Symptoms ([place]) The patient has no complaints or symptoms. Eyes The patient has no complaints or symptoms. Ear/Nose/Mouth/Throat The patient has no complaints or symptoms. Hematologic/Lymphatic The patient has no complaints or symptoms. Respiratory The patient  has no complaints or symptoms. Cardiovascular heart murmur Gastrointestinal The patient has no complaints or symptoms. Endocrine The patient has no complaints or symptoms. Genitourinary hx kidney stones Immunological The patient has no complaints or symptoms. Integumentary (Skin) herpex genital viral warts Musculoskeletal The patient has no complaints or symptoms. Neurologic The patient has no complaints or symptoms. Oncologic breast cancer lumpectomy 12/18/2017 Objective Constitutional Sitting or standing Blood Pressure is within target range for patient.. Pulse regular and within target range for patient.Tommi Rumps, Lawson Radar ([id number]) Respirations regular, non-labored and within target range.. Temperature is normal and within the target range for the patient.Marland Kitchen appears in no distress. Vitals Time Taken: 10:03 AM, Height: 64 in, Source: Stated, Weight: 151.6 lbs, Source: Measured, BMI: 26, Temperature: 98.1 F, Pulse: 63 bpm, Respiratory Rate: 18 breaths/min, Blood Pressure: 126/60 mmHg. Eyes Conjunctivae clear. No discharge. Neck Neck supple and symmetrical. No masses or crepitus. Respiratory Respiratory effort is easy and symmetric bilaterally. Rate is normal at rest and on room air.. Cardiovascular Heart rhythm and rate regular, without murmur or gallop.. Chest wound is in the midsection of the left breast just above the left nipple. other than the wound I felt no suspicious areas. Including no nodules, no tender areas and no crepitus. Gastrointestinal (GI) no palpable liver.Marland Kitchen Lymphatic none palpable in the left axilla, left supraclavicular, left infraclavicular or left cervical chains. Psychiatric No evidence of depression, anxiety, or agitation. Calm, cooperative, and communicative. Appropriate interactions and affect.. General Notes: wound exam; Left breast the surgical wound is in the mid part of the left breast just above the nipple. This has depth and also  undermines laterally. There is some mild to moderate amount of purulent drainage which I have cultured. Most of the tissue I couldn visualize looked healthy. In particular there was nothing that looked like it needed debridement as far as I could tell. There was no palpable tenderness no overt abscess. Integumentary (Hair, Skin) Wound #1 status is Open. Original cause of wound was Surgical Injury. The wound is located on the Left Breast. The wound measures 0.4cm length x 2.3cm width x 1.2cm depth; 0.723cm^2 area and 0.867cm^3 volume. There is Fat Layer (Subcutaneous Tissue) Exposed exposed. There is no tunneling noted, however, there is undermining starting at 3:00 and ending at 9:00 with a maximum distance of 3cm. There is a large amount of purulent drainage noted. The wound margin is distinct with the outline attached to the wound base. There is medium (34-66%) red granulation within the wound bed. There is a medium (34-66%)  amount of necrotic tissue within the wound bed including Eschar and Adherent Slough. The periwound skin appearance exhibited: Maceration. Periwound temperature was noted as No Abnormality. The periwound has tenderness on palpation. Assessment Active Problems ICD-10 T81.31XD - Disruption of external operation (surgical) wound, not elsewhere classified, subsequent encounter C50.112 - Malignant neoplasm of central portion of left female breast S21.002D - Unspecified open wound of left breast, subsequent encounter Holly, Bennett ([id number]) Plan Wound Cleansing: Wound #1 Left Breast: Cleanse wound with mild soap and water Anesthetic (add to Medication List): Wound #1 Left Breast: Topical Lidocaine 4% cream applied to wound bed prior to debridement (In Clinic Only). Primary Wound Dressing: Wound #1 Left Breast: Silvercel Non-Adherent Secondary Dressing: Wound #1 Left Breast: Boardered Foam Dressing Dressing Change Frequency: Wound #1 Left Breast: Change dressing  every day. Follow-up Appointments: Wound #1 Left Breast: Return Appointment in 1 week. Laboratory ordered were: Wound culture routine - lEFT bREAST #1 the wound itself is fairly large and undermines laterally. I think the wound itself would be better served by silver alginate rope packing. This would help with the antibacterial effect and help with the drainage. She'll be changing this every day. We will order her supplies through a product vendor. #2 the drainage that was exhibited bilateral palpation looked purulent. I have obtained a specimen. I have not prescribed any antibiotics. I see that the previous culture was Klebsiella oxytoca, although at the time of this dictation I can't see what antibiotics were used however she is completed them. 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 I advised her that it would probably be commonsense to avoid up and down type movements as far as the wound is concerned. Expressed understanding. #4 the patient had a consult with radiation oncology although they are not able to start radiation until the wound is healed Electronic Signature(s) Signed: 12/29/2017 5:10:26 PM By: Linton Ham MD Entered By: Linton Ham on 12/29/2017 12:09:40 Holly Bennett ([id number]) -------------------------------------------------------------------------------- ROS/PFSH Details Patient Name: Holly Bennett. Date of Service: 12/29/2017 9:45 AM Medical Record Number: [id number] Patient Account Number: [id number] Date of Birth/Sex: 1966/03/19 (52 y.o. Female) Treating RN: Carolyne Fiscal, Debi Primary Care Provider: [name] Other Clinician: Referring Provider: BURNS, Bennett Treating Provider/Extender: Holly Bennett Weeks in Treatment: 0 Information Obtained From Patient Wound History Do you currently have one or more open woundso Yes How many open wounds do you currently haveo 1 Approximately how long have you had your woundso 3 weeks How have you  been treating your wound(s) until nowo silvadene Has your wound(s) ever healed and then re-openedo No Have you had any lab work done in the past montho No Have you tested positive for an antibiotic resistant organism (MRSA, VRE)o No Have you tested positive for osteomyelitis (bone infection)o No Have you had any tests for circulation on your legso No Constitutional Symptoms (General Health) Complaints and Symptoms: No Complaints or Symptoms Eyes Complaints and Symptoms: No Complaints or Symptoms Ear/Nose/Mouth/Throat Complaints and Symptoms: No Complaints or Symptoms Hematologic/Lymphatic Complaints and Symptoms: No Complaints or Symptoms Respiratory Complaints and Symptoms: No Complaints or Symptoms Cardiovascular Complaints and Symptoms: Review of System Notes: heart murmur Gastrointestinal Complaints and Symptoms: No Complaints or Symptoms Holly, Bennett. ([id number]) Endocrine Complaints and Symptoms: No Complaints or Symptoms Genitourinary Complaints and Symptoms: Review of System Notes: hx kidney stones Immunological Complaints and Symptoms: No Complaints or Symptoms Integumentary (Skin) Complaints and Symptoms: Review of System Notes: herpex genital viral warts Musculoskeletal Complaints and Symptoms: No Complaints  or Symptoms Neurologic Complaints and Symptoms: No Complaints or Symptoms Oncologic Complaints and Symptoms: Review of System Notes: breast cancer lumpectomy 12/18/2017 Medical History: Positive for: Received Radiation - havent started yet Immunizations Pneumococcal Vaccine: Received Pneumococcal Vaccination: No Implantable Devices Family and Social History Cancer: Yes - Mother,Father; Diabetes: No; Heart Disease: No; Hereditary Spherocytosis: No; Hypertension: Yes - Father; Kidney Disease: No; Lung Disease: Yes - Father; Seizures: No; Stroke: Yes - Father; Thyroid Problems: No; Tuberculosis: No; Never smoker; Marital Status - Married;  Alcohol Use: Moderate - once a week; Drug Use: No History; Caffeine Use: Daily; Financial Concerns: No; Food, Clothing or Shelter Needs: No; Support System Lacking: No; Transportation Concerns: No; Advanced Directives: No; Patient does not want information on Advanced Directives; Do not resuscitate: No; Living Will: No; Medical Power of Attorney: No Electronic Signature(s) Signed: 12/29/2017 4:15:20 PM By: Harl Favor ([id number]) Signed: 12/29/2017 5:10:26 PM By: Linton Ham MD Entered By: Alric Quan on 12/29/2017 10:11:09 Holly Bennett ([id number]) -------------------------------------------------------------------------------- SuperBill Details Patient Name: Holly Bennett Date of Service: 12/29/2017 Medical Record Number: [id number] Patient Account Number: [id number] Date of Birth/Sex: Mar 20, 1966 (52 y.o. 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 S21.002D Unspecified open wound of left breast, subsequent encounter Facility Procedures CPT4 Code: [id number] Description: 99214 - WOUND CARE VISIT-LEV 4 EST PT Modifier: Quantity: 1 Physician Procedures CPT4: Description Modifier Quantity Code [id number] WC PHYS LEVEL 3 o NEW PT 1 ICD-10 Diagnosis Description T81.31XD Disruption of external operation (surgical) wound, not elsewhere classified, subsequent encounter S21.002D Unspecified open wound of left  breast, subsequent encounter 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

## 2017-12-30 NOTE — Progress Notes (Signed)
ELANE, PEABODY ([id number]) Visit Report for 12/29/2017 Abuse/Suicide Risk Screen Details Patient Name: Holly Bennett, Holly Bennett. Date of Service: 12/29/2017 9:45 AM Medical Record Number: [id number] Patient Account Number: [id number] Date of Birth/Sex: 1966-10-25 (52 y.o. Female) Treating RN: Carolyne Fiscal, Debi Primary Care Tequila Rottmann: Princess Bruins Other Clinician: Referring Amberlee Garvey: BURNS, JENNIFER Treating Fenna Semel/Extender: Ricard Dillon Weeks in Treatment: 0 Abuse/Suicide Risk Screen Items Answer ABUSE/SUICIDE RISK SCREEN: Has anyone close to you tried to hurt or harm you recentlyo No Do you feel uncomfortable with anyone in your familyo No Has anyone forced you do things that you didnot want to doo No Do you have any thoughts of harming yourselfo No Patient displays signs or symptoms of abuse and/or neglect. No Electronic Signature(s) Signed: 12/29/2017 4:15:20 PM By: Alric Quan Entered By: Alric Quan on 12/29/2017 10:11:17 Holly Bennett ([id number]) -------------------------------------------------------------------------------- Activities of Daily Living Details Patient Name: Holly Bennett. Date of Service: 12/29/2017 9:45 AM Medical Record Number: [id number] Patient Account Number: [id number] Date of Birth/Sex: [birth year] (52 y.o. Female) Treating RN: Carolyne Fiscal, Debi Primary Care Blanchie Zeleznik: Princess Bruins Other Clinician: Referring Okie Bogacz: BURNS, JENNIFER Treating Demeco Ducksworth/Extender: Ricard Dillon Weeks in Treatment: 0 Activities of Daily Living Items Answer Activities of Daily Living (Please select one for each item) Drive Automobile Completely Able Take Medications Completely Able Use Telephone Completely Able Care for Appearance Completely Able Use Toilet Completely Able Bath / Shower Completely Able Dress Self Completely Able Feed Self Completely Able Walk Completely Able Get In / Out Bed Completely Able Housework Completely Able Prepare Meals  Completely Able Handle Money Completely Able Shop for Self Completely Able Electronic Signature(s) Signed: 12/29/2017 4:15:20 PM By: Alric Quan Entered By: Alric Quan on 12/29/2017 10:11:44 Holly Bennett ([id number]) -------------------------------------------------------------------------------- Education Assessment Details Patient Name: Holly Bennett Date of Service: 12/29/2017 9:45 AM Medical Record Number: [id number] Patient Account Number: [id number] Date of Birth/Sex: 12/01/1965 (52 y.o. Female) Treating RN: Carolyne Fiscal, Debi Primary Care Antwoine Zorn: Princess Bruins Other Clinician: Referring Latrece Nitta: BURNS, JENNIFER Treating Therese Rocco/Extender: Tito Dine in Treatment: 0 Primary Learner Assessed: Patient Learning Preferences/Education Level/Primary Language Learning Preference: Explanation, Printed Material Highest Education Level: College or Above Preferred Language: English Cognitive Barrier Assessment/Beliefs Language Barrier: No Translator Needed: No Memory Deficit: No Emotional Barrier: No Cultural/Religious Beliefs Affecting Medical Care: No Physical Barrier Assessment Impaired Vision: No Impaired Hearing: No Decreased Hand dexterity: No Knowledge/Comprehension Assessment Knowledge Level: Medium Comprehension Level: Medium Ability to understand written Medium instructions: Ability to understand verbal Medium instructions: Motivation Assessment Anxiety Level: Calm Cooperation: Cooperative Education Importance: Acknowledges Need Interest in Health Problems: Asks Questions Perception: Coherent Willingness to Engage in Self- Medium Management Activities: Readiness to Engage in Self- Medium Management Activities: Electronic Signature(s) Signed: 12/29/2017 4:15:20 PM By: Alric Quan Entered By: Alric Quan on 12/29/2017 10:12:13 Holly Bennett  ([id number]) -------------------------------------------------------------------------------- Fall Risk Assessment Details Patient Name: Holly Bennett Date of Service: 12/29/2017 9:45 AM Medical Record Number: [id number] Patient Account Number: [id number] Date of Birth/Sex: [birth year] (52 y.o. Female) Treating RN: Carolyne Fiscal, Debi Primary Care Jaxton Casale: Princess Bruins Other Clinician: Referring Toshia Larkin: BURNS, JENNIFER Treating Fontaine Hehl/Extender: Ricard Dillon Weeks in Treatment: 0 Fall Risk Assessment Items Have you had 2 or more falls in the last 12 monthso 0 No Have you had any fall that resulted in injury in the last 12 monthso 0 No FALL RISK ASSESSMENT: History of falling - immediate or within 3 months 0 No Secondary diagnosis 0 No Ambulatory aid None/bed rest/wheelchair/nurse 0 No Crutches/cane/walker 0 No  Furniture 0 No IV Access/Saline Lock 0 No Gait/Training Normal/bed rest/immobile 0 No Weak 0 No Impaired 0 No Mental Status Oriented to own ability 0 Yes Electronic Signature(s) Signed: 12/29/2017 4:15:20 PM By: Alric Quan Entered By: Alric Quan on 12/29/2017 10:12:21 Holly Bennett ([id number]) -------------------------------------------------------------------------------- Foot Assessment Details Patient Name: Holly Bennett. Date of Service: 12/29/2017 9:45 AM Medical Record Number: [id number] Patient Account Number: [id number] Date of Birth/Sex: 05-02-1966 (52 y.o. Female) Treating RN: Carolyne Fiscal, Debi Primary Care Anjolie Majer: Princess Bruins Other Clinician: Referring Ysidra Sopher: BURNS, JENNIFER Treating Makeya Hilgert/Extender: Ricard Dillon Weeks in Treatment: 0 Foot Assessment Items Site Locations + = Sensation present, - = Sensation absent, C = Callus, U = Ulcer R = Redness, W = Warmth, M = Maceration, PU = Pre-ulcerative lesion F = Fissure, S = Swelling, D = Dryness Assessment Right: Left: Other Deformity: No No Prior Foot Ulcer: No  No Prior Amputation: No No Charcot Joint: No No Ambulatory Status: Gait: Electronic Signature(s) Signed: 12/29/2017 4:15:20 PM By: Alric Quan Entered By: Alric Quan on 12/29/2017 10:12:56 Holly Bennett ([id number]) -------------------------------------------------------------------------------- Nutrition Risk Assessment Details Patient Name: Holly Bennett. Date of Service: 12/29/2017 9:45 AM Medical Record Number: [id number] Patient Account Number: [id number] Date of Birth/Sex: [birth year] (52 y.o. Female) Treating RN: Carolyne Fiscal, Debi Primary Care Korey Arroyo: Princess Bruins Other Clinician: Referring Trigger Frasier: BURNS, JENNIFER Treating Lavonda Thal/Extender: Ricard Dillon Weeks in Treatment: 0 Height (in): 64 Weight (lbs): 151.6 Body Mass Index (BMI): 26 Nutrition Risk Assessment Items NUTRITION RISK SCREEN: I have an illness or condition that made me change the kind and/or amount of 2 Yes food I eat I eat fewer than two meals per day 3 Yes I eat few fruits and vegetables, or milk products 0 No I have three or more drinks of beer, liquor or wine almost every day 0 No I have tooth or mouth problems that make it hard for me to eat 0 No I don't always have enough money to buy the food I need 0 No I eat alone most of the time 0 No I take three or more different prescribed or over-the-counter drugs a day 0 No Without wanting to, I have lost or gained 10 pounds in the last six months 0 No I am not always physically able to shop, cook and/or feed myself 0 No Nutrition Protocols Good Risk Protocol Moderate Risk Protocol Electronic Signature(s) Signed: 12/29/2017 4:15:20 PM By: Alric Quan Entered By: Alric Quan on 12/29/2017 10:12:46

## 2017-12-30 NOTE — Patient Instructions (Signed)
Please continue to pack the wound as instructed by the wound center.   Please see your follow up appointment listed below.

## 2017-12-31 ENCOUNTER — Encounter: Payer: Self-pay | Admitting: Surgery

## 2017-12-31 NOTE — Progress Notes (Signed)
Holly Bennett, Holly Bennett ([id number]) Visit Report for 12/29/2017 Allergy List Details Patient Name: Holly Bennett, Holly Bennett. Date of Service: 12/29/2017 9:45 AM Medical Record Number: [id number] Patient Account Number: [id number] Date of Birth/Sex: 1966-10-14 (52 y.o. Female) Treating RN: Carolyne Fiscal, Debi Primary Care Clarice Bonaventure: Princess Bruins Other Clinician: Referring Tamaiya Bump: BURNS, JENNIFER Treating Seddrick Flax/Extender: Ricard Dillon Weeks in Treatment: 0 Allergies Active Allergies penicillin blue dye (parenteral) Allergy Notes Electronic Signature(s) Signed: 12/29/2017 4:15:20 PM By: Alric Quan Entered By: Alric Quan on 12/29/2017 10:26:04 Holly Bennett ([id number]) -------------------------------------------------------------------------------- Arrival Information Details Patient Name: Holly Bennett. Date of Service: 12/29/2017 9:45 AM Medical Record Number: [id number] Patient Account Number: [id number] Date of Birth/Sex: 01/14/1966 (52 y.o. Female) Treating RN: Carolyne Fiscal, Debi Primary Care Khasir Woodrome: Princess Bruins Other Clinician: Referring Jinny Sweetland: BURNS, JENNIFER Treating Sharronda Schweers/Extender: Tito Dine in Treatment: 0 Visit Information Patient Arrived: Ambulatory Arrival Time: 10:03 Accompanied By: self Transfer Assistance: None Patient Identification Verified: Yes Secondary Verification Process Completed: Yes Patient Requires Transmission-Based No Precautions: Patient Has Alerts: No Electronic Signature(s) Signed: 12/29/2017 4:15:20 PM By: Alric Quan Entered By: Alric Quan on 12/29/2017 10:03:35 Holly Bennett ([id number]) -------------------------------------------------------------------------------- Clinic Level of Care Assessment Details Patient Name: Holly Bennett Date of Service: 12/29/2017 9:45 AM Medical Record Number: [id number] Patient Account Number: [id number] Date of Birth/Sex: 1966/09/15 (52 y.o. Female) Treating RN: Cornell Barman Primary Care Roe Koffman: Princess Bruins Other Clinician: Referring Patton Swisher: BURNS, JENNIFER Treating Addaline Peplinski/Extender: Tito Dine in Treatment: 0 Clinic Level of Care Assessment Items TOOL 2 Quantity Score []  - Use when only an EandM is performed on the INITIAL visit 0 ASSESSMENTS - Nursing Assessment / Reassessment []  - General Physical Exam (combine w/ comprehensive assessment (listed just below) when 0 performed on new pt. evals) X- 1 25 Comprehensive Assessment (HX, ROS, Risk Assessments, Wounds Hx, etc.) ASSESSMENTS - Wound and Skin Assessment / Reassessment X - Simple Wound Assessment / Reassessment - one wound 1 5 []  - 0 Complex Wound Assessment / Reassessment - multiple wounds []  - 0 Dermatologic / Skin Assessment (not related to wound area) ASSESSMENTS - Ostomy and/or Continence Assessment and Care []  - Incontinence Assessment and Management 0 []  - 0 Ostomy Care Assessment and Management (repouching, etc.) PROCESS - Coordination of Care X - Simple Patient / Family Education for ongoing care 1 15 []  - 0 Complex (extensive) Patient / Family Education for ongoing care X- 1 10 Staff obtains Programmer, systems, Records, Test Results / Process Orders []  - 0 Staff telephones HHA, Nursing Homes / Clarify orders / etc []  - 0 Routine Transfer to another Facility (non-emergent condition) []  - 0 Routine Hospital Admission (non-emergent condition) X- 1 15 New Admissions / Biomedical engineer / Ordering NPWT, Apligraf, etc. []  - 0 Emergency Hospital Admission (emergent condition) X- 1 10 Simple Discharge Coordination []  - 0 Complex (extensive) Discharge Coordination PROCESS - Special Needs []  - Pediatric / Minor Patient Management 0 []  - 0 Isolation Patient Management Holly Bennett, Holly Bennett ([id number]) []  - 0 Hearing / Language / Visual special needs []  - 0 Assessment of Community assistance (transportation, D/C planning, etc.) []  - 0 Additional assistance /  Altered mentation []  - 0 Support Surface(s) Assessment (bed, cushion, seat, etc.) INTERVENTIONS - Wound Cleansing / Measurement X - Wound Imaging (photographs - any number of wounds) 1 5 []  - 0 Wound Tracing (instead of photographs) X- 1 5 Simple Wound Measurement - one wound []  - 0 Complex Wound Measurement - multiple wounds X- 1 5 Simple Wound  Cleansing - one wound []  - 0 Complex Wound Cleansing - multiple wounds INTERVENTIONS - Wound Dressings []  - Small Wound Dressing one or multiple wounds 0 X- 1 15 Medium Wound Dressing one or multiple wounds []  - 0 Large Wound Dressing one or multiple wounds []  - 0 Application of Medications - injection INTERVENTIONS - Miscellaneous []  - External ear exam 0 X- 1 5 Specimen Collection (cultures, biopsies, blood, body fluids, etc.) X- 1 5 Specimen(s) / Culture(s) sent or taken to Lab for analysis []  - 0 Patient Transfer (multiple staff / Civil Service fast streamer / Similar devices) []  - 0 Simple Staple / Suture removal (25 or less) []  - 0 Complex Staple / Suture removal (26 or more) []  - 0 Hypo / Hyperglycemic Management (close monitor of Blood Glucose) []  - 0 Ankle / Brachial Index (ABI) - do not check if billed separately Has the patient been seen at the hospital within the last three years: Yes Total Score: 120 Level Of Care: New/Established - Level 4 Electronic Signature(s) Signed: 12/30/2017 5:18:15 PM By: Gretta Cool, BSN, RN, CWS, Kim RN, BSN Entered By: Gretta Cool, BSN, RN, CWS, Kim on 12/30/2017 16:40:56 Holly Bennett ([id number]) -------------------------------------------------------------------------------- Encounter Discharge Information Details Patient Name: Holly Bennett Date of Service: 12/29/2017 9:45 AM Medical Record Number: [id number] Patient Account Number: [id number] Date of Birth/Sex: Sep 27, 1966 (52 y.o. Female) Treating RN: Montey Hora Primary Care Erick Murin: Princess Bruins Other Clinician: Referring Lezlie Ritchey: BURNS,  JENNIFER Treating Barlow Harrison/Extender: Tito Dine in Treatment: 0 Encounter Discharge Information Items Discharge Pain Level: 0 Discharge Condition: Stable Ambulatory Status: Ambulatory Discharge Destination: Home Transportation: Private Auto Accompanied By: self Schedule Follow-up Appointment: Yes Medication Reconciliation completed and No provided to Patient/Care Diana Armijo: Provided on Clinical Summary of Care: 12/29/2017 Form Type Recipient Paper Patient JW Electronic Signature(s) Signed: 12/30/2017 11:42:25 AM By: Ruthine Dose Entered By: Ruthine Dose on 12/29/2017 12:45:09 Holly Bennett ([id number]) -------------------------------------------------------------------------------- Lower Extremity Assessment Details Patient Name: Holly Bennett. Date of Service: 12/29/2017 9:45 AM Medical Record Number: [id number] Patient Account Number: [id number] Date of Birth/Sex: 18-Nov-1965 (52 y.o. Female) Treating RN: Carolyne Fiscal, Debi Primary Care Madelyn Tlatelpa: Princess Bruins Other Clinician: Referring Greta Yung: BURNS, JENNIFER Treating Adhira Jamil/Extender: Ricard Dillon Weeks in Treatment: 0 Electronic Signature(s) Signed: 12/29/2017 4:15:20 PM By: Alric Quan Entered By: Alric Quan on 12/29/2017 10:13:06 Holly Bennett ([id number]) -------------------------------------------------------------------------------- Multi Wound Chart Details Patient Name: Holly Bennett Date of Service: 12/29/2017 9:45 AM Medical Record Number: [id number] Patient Account Number: [id number] Date of Birth/Sex: Sep 16, 1966 (52 y.o. Female) Treating RN: Cornell Barman Primary Care Diontre Harps: Princess Bruins Other Clinician: Referring Alejandrina Raimer: BURNS, JENNIFER Treating Marianne Golightly/Extender: Ricard Dillon Weeks in Treatment: 0 Vital Signs Height(in): 64 Pulse(bpm): 68 Weight(lbs): 151.6 Blood Pressure(mmHg): 126/60 Body Mass Index(BMI): 26 Temperature(F): 98.1 Respiratory  Rate 18 (breaths/min): Photos: [1:No Photos] [N/A:N/A] Wound Location: [1:Left Breast] [N/A:N/A] Wounding Event: [1:Surgical Injury] [N/A:N/A] Primary Etiology: [1:Dehisced Wound] [N/A:N/A] Comorbid History: [1:Received Radiation] [N/A:N/A] Date Acquired: [1:12/08/2017] [N/A:N/A] Weeks of Treatment: [1:0] [N/A:N/A] Wound Status: [1:Open] [N/A:N/A] Measurements L x W x D [1:0.4x2.3x1.2] [N/A:N/A] (cm) Area (cm) : [1:0.723] [N/A:N/A] Volume (cm) : [1:0.867] [N/A:N/A] Starting Position 1 [1:3] (o'clock): Ending Position 1 [1:9] (o'clock): Maximum Distance 1 (cm): [1:3] Undermining: [1:Yes] [N/A:N/A] Classification: [1:Full Thickness Without Exposed Support Structures] [N/A:N/A] Exudate Amount: [1:Large] [N/A:N/A] Exudate Type: [1:Purulent] [N/A:N/A] Exudate Color: [1:yellow, brown, green] [N/A:N/A] Wound Margin: [1:Distinct, outline attached] [N/A:N/A] Granulation Amount: [1:Medium (34-66%)] [N/A:N/A] Granulation Quality: [1:Red] [N/A:N/A] Necrotic Amount: [1:Medium (34-66%)] [N/A:N/A] Necrotic Tissue: [1:Eschar, Adherent Slough] [  N/A:N/A] Exposed Structures: [1:Fat Layer (Subcutaneous Tissue) Exposed: Yes Fascia: No Tendon: No Muscle: No Joint: No Bone: No] [N/A:N/A] Epithelialization: [1:None] [N/A:N/A] Periwound Skin Texture: [1:No Abnormalities Noted] [N/A:N/A] Periwound Skin Moisture: Maceration: Yes N/A N/A Periwound Skin Color: No Abnormalities Noted N/A N/A Temperature: No Abnormality N/A N/A Tenderness on Palpation: Yes N/A N/A Wound Preparation: Ulcer Cleansing: N/A N/A Rinsed/Irrigated with Saline Topical Anesthetic Applied: Other: lidocaine 4% Treatment Notes Wound #1 (Left Breast) 1. Cleansed with: Clean wound with Normal Saline 2. Anesthetic Topical Lidocaine 4% cream to wound bed prior to debridement 4. Dressing Applied: Other dressing (specify in notes) 5. Secondary Dressing Applied Bordered Foam Dressing Dry Gauze Notes silvercel Electronic  Signature(s) Signed: 12/29/2017 5:10:26 PM By: Linton Ham MD Entered By: Linton Ham on 12/29/2017 11:39:41 Holly Bennett ([id number]) -------------------------------------------------------------------------------- Multi-Disciplinary Care Plan Details Patient Name: Holly Bennett Date of Service: 12/29/2017 9:45 AM Medical Record Number: [id number] Patient Account Number: [id number] Date of Birth/Sex: 1966-02-13 (52 y.o. Female) Treating RN: Cornell Barman Primary Care Shawnetta Lein: Princess Bruins Other Clinician: Referring Becky Berberian: BURNS, JENNIFER Treating Ozias Dicenzo/Extender: Tito Dine in Treatment: 0 Active Inactive ` Malignancy/Atypical Etiology Nursing Diagnoses: Knowledge deficit related to disease process and management of atypical ulcer etiology Goals: Patient/caregiver will verbalize understanding of disease process and disease management of malignancy Date Initiated: 12/29/2017 Target Resolution Date: 01/28/2018 Goal Status: Active Interventions: Provide education on malignant ulcerations Notes: ` Necrotic Tissue Nursing Diagnoses: Impaired tissue integrity related to necrotic/devitalized tissue Goals: Necrotic/devitalized tissue will be minimized in the wound bed Date Initiated: 12/29/2017 Target Resolution Date: 01/28/2018 Goal Status: Active Interventions: Assess patient pain level pre-, during and post procedure and prior to discharge Treatment Activities: Apply topical anesthetic as ordered : 12/29/2017 Notes: ` Orientation to the Wound Care Program Nursing Diagnoses: Knowledge deficit related to the wound healing center program Goals: Patient/caregiver will verbalize understanding of the [place] Date Initiated: 12/29/2017 Target Resolution Date: 01/28/2018 Holly Bennett, Holly Bennett ([id number]) Goal Status: Active Interventions: Provide education on orientation to the wound center Notes: Electronic Signature(s) Signed: 12/29/2017  4:24:03 PM By: Gretta Cool, BSN, RN, CWS, Kim RN, BSN Entered By: Gretta Cool, BSN, RN, CWS, Kim on 12/29/2017 10:37:03 Holly Bennett ([id number]) -------------------------------------------------------------------------------- Pain Assessment Details Patient Name: Holly Bennett Date of Service: 12/29/2017 9:45 AM Medical Record Number: [id number] Patient Account Number: [id number] Date of Birth/Sex: 09-30-1966 (52 y.o. Female) Treating RN: Carolyne Fiscal, Debi Primary Care Dewell Monnier: Princess Bruins Other Clinician: Referring Dexton Zwilling: BURNS, JENNIFER Treating Abir Craine/Extender: Ricard Dillon Weeks in Treatment: 0 Active Problems Location of Pain Severity and Description of Pain Patient Has Paino No Site Locations Pain Management and Medication Current Pain Management: Electronic Signature(s) Signed: 12/29/2017 4:15:20 PM By: Alric Quan Entered By: Alric Quan on 12/29/2017 10:03:41 Holly Bennett ([id number]) -------------------------------------------------------------------------------- Patient/Caregiver Education Details Patient Name: Holly Bennett Date of Service: 12/29/2017 9:45 AM Medical Record Number: [id number] Patient Account Number: [id number] Date of Birth/Gender: 25-May-1966 (52 y.o. Female) Treating RN: Montey Hora Primary Care Physician: Princess Bruins Other Clinician: Referring Physician: BURNS, JENNIFER Treating Physician/Extender: Tito Dine in Treatment: 0 Education Assessment Education Provided To: Patient Education Topics Provided Wound/Skin Impairment: Handouts: Other: wound care as ordered Methods: Demonstration, Explain/Verbal Responses: State content correctly Electronic Signature(s) Signed: 12/29/2017 4:38:42 PM By: Montey Hora Entered By: Montey Hora on 12/29/2017 11:33:14 Holly Bennett ([id number]) -------------------------------------------------------------------------------- Wound Assessment Details Patient Name:  Holly Bennett. Date of Service: 12/29/2017 9:45 AM Medical Record Number: [id number] Patient Account Number: [id number] Date  of Birth/Sex: 02/12/1966 (52 y.o. Female) Treating RN: Carolyne Fiscal, Debi Primary Care Almena Hokenson: Princess Bruins Other Clinician: Referring Anny Sayler: BURNS, JENNIFER Treating Hildreth Robart/Extender: Ricard Dillon Weeks in Treatment: 0 Wound Status Wound Number: 1 Primary Etiology: Dehisced Wound Wound Location: Left Breast Wound Status: Open Wounding Event: Surgical Injury Comorbid History: Received Radiation Date Acquired: 12/08/2017 Weeks Of Treatment: 0 Clustered Wound: No Photos Photo Uploaded By: Gretta Cool, BSN, RN, CWS, Kim on 12/29/2017 16:49:13 Wound Measurements Length: (cm) 0.4 % Reduct Width: (cm) 2.3 % Reduct Depth: (cm) 1.2 Epitheli Area: (cm) 0.723 Tunneli Volume: (cm) 0.867 Undermi Start Endin Maxim ion in Area: ion in Volume: alization: None ng: No ning: Yes ing Position (o'clock): 3 g Position (o'clock): 9 um Distance: (cm) 3 Wound Description Full Thickness Without Exposed Support Foul Odo Classification: Structures Slough/F Wound Margin: Distinct, outline attached Exudate Large Amount: Exudate Type: Purulent Exudate Color: yellow, brown, green r After Cleansing: No ibrino Yes Wound Bed Granulation Amount: Medium (34-66%) Exposed Structure Granulation Quality: Red Fascia Exposed: No Necrotic Amount: Medium (34-66%) Fat Layer (Subcutaneous Tissue) Exposed: Yes MARKI, FREDE ([id number]) Necrotic Quality: Eschar, Adherent Slough Tendon Exposed: No Muscle Exposed: No Joint Exposed: No Bone Exposed: No Periwound Skin Texture Texture Color No Abnormalities Noted: No No Abnormalities Noted: No Moisture Temperature / Pain No Abnormalities Noted: No Temperature: No Abnormality Maceration: Yes Tenderness on Palpation: Yes Wound Preparation Ulcer Cleansing: Rinsed/Irrigated with Saline Topical Anesthetic  Applied: Other: lidocaine 4%, Treatment Notes Wound #1 (Left Breast) 1. Cleansed with: Clean wound with Normal Saline 2. Anesthetic Topical Lidocaine 4% cream to wound bed prior to debridement 4. Dressing Applied: Other dressing (specify in notes) 5. Secondary Dressing Applied Bordered Foam Dressing Dry Gauze Notes silvercel Electronic Signature(s) Signed: 12/29/2017 4:15:20 PM By: Alric Quan Entered By: Alric Quan on 12/29/2017 10:25:15 Holly Bennett ([id number]) -------------------------------------------------------------------------------- Vitals Details Patient Name: Holly Bennett Date of Service: 12/29/2017 9:45 AM Medical Record Number: [id number] Patient Account Number: [id number] Date of Birth/Sex: [birth year] (52 y.o. Female) Treating RN: Carolyne Fiscal, Debi Primary Care Kaisei Gilbo: Princess Bruins Other Clinician: Referring Sevan Mcbroom: BURNS, JENNIFER Treating Raysean Graumann/Extender: Ricard Dillon Weeks in Treatment: 0 Vital Signs Time Taken: 10:03 Temperature (F): 98.1 Height (in): 64 Pulse (bpm): 63 Source: Stated Respiratory Rate (breaths/min): 18 Weight (lbs): 151.6 Blood Pressure (mmHg): 126/60 Source: Measured Reference Range: 80 - 120 mg / dl Body Mass Index (BMI): 26 Electronic Signature(s) Signed: 12/29/2017 4:15:20 PM By: Alric Quan Entered By: Alric Quan on 12/29/2017 10:04:10

## 2017-12-31 NOTE — Progress Notes (Signed)
12/31/2017  HPI: Patient is s/p wire localized left breast lumpectomy and sentinel node biopsy for stage I breast cancer.  She has had a complication with wound healing, possibly due to necrosis from methylene blue vs burn.  She has had seroma aspiration via ultrasound which grew E coli and has completed an antibiotic course.  The inferior portion of her wound had formed an eschar which was removed on 2/25 along with fibrinous material.    Since her last visit she has been in touch with the [facility] and asked for a referral to a wound specialist.  She has been started on wound packing with silver alginate gauze dressing.  She presents today with her husband.  They are frustrated about her care, the wound complication, and the lack of consistency with the physicians that see her instead of always being the operating surgeon.  She feels we should have sent the referral to wound care from the beginning instead of it being the cancer center.  She feels this wound complication is delaying her radiation treatment as well.  Vital signs: BP (!) 166/98   Pulse 73   Temp 97.8 F (36.6 C) (Oral)   Ht 5\' 4"  (1.626 m)   Wt 69.5 kg (153 lb 3.2 oz)   BMI 26.30 kg/m    Physical Exam: Constitutional: No acute distress Breast:  Left breast wound without any further necrotic or fibrinous material at the edges.  Silver alginate dressing removed.  There is good healthy granulation tissue over the inside edges and skin is healing appropriately.  There is no further blue dye on the skin or nipple/areola complex, but the NAC is still somewhat indurated compared to the rest of the skin.  Upon squeezing, there is no fluid drainage and no purulence or foul odor.  The wound measures about 2.5 x 2.5 cm with a depth of about 2.5 cm.  Wound packed with 1/2 inch nugauze.  Assessment/Plan: 52 yo female s/p wire localized left breast lumpectomy and sentinel node biopsy.  Discussed and listened further with the  patient about the frustrations and complications that she's had.  The etiology of her wound complication is unclear, though I think given the lingering blue dye and induration of the skin, this could be necrosis from methylene blue.  Discussed as well that our schedule currently does not allow for [place] to be in clinic every week, but apologized that I have not been as available to see her.  Discussed with her that the schedule will be changing so this would not be an issue.  Reassured the patient that currently the wound is healthy and appears to be healing appropriately.  There is no evidence of infection.  She should continue with wound packing with silver alginate as instructed by the wound center.  I expect that the wound will be fully healed in a month so that she can start her radiation therapy after that.  I will send Dr. Baruch Gouty a message to update him about this.    She will continue following with the wound care center and she will follow up with me as well next week.   [name], [place]

## 2018-01-02 LAB — AEROBIC CULTURE  (SUPERFICIAL SPECIMEN)

## 2018-01-02 LAB — AEROBIC CULTURE W GRAM STAIN (SUPERFICIAL SPECIMEN)

## 2018-01-05 ENCOUNTER — Encounter: Payer: 59 | Admitting: Internal Medicine

## 2018-01-05 ENCOUNTER — Telehealth: Payer: Self-pay

## 2018-01-05 DIAGNOSIS — B9561 Methicillin susceptible Staphylococcus aureus infection as the cause of diseases classified elsewhere: Secondary | ICD-10-CM | POA: Diagnosis not present

## 2018-01-05 DIAGNOSIS — T8131XD Disruption of external operation (surgical) wound, not elsewhere classified, subsequent encounter: Secondary | ICD-10-CM | POA: Diagnosis not present

## 2018-01-05 DIAGNOSIS — T8141XA Infection following a procedure, superficial incisional surgical site, initial encounter: Secondary | ICD-10-CM | POA: Diagnosis not present

## 2018-01-05 NOTE — Telephone Encounter (Signed)
I spoke with Holly Bennett. She was very nice and open on the phone. She stated she is not satisfied with the treatment she has received and that she has lost faith in [name].  She will continue receiving  treatment  through the wound center as she has discussed with her doctor there and is very satisfied.  She stated she was told today that she has a staff infection again and there was a pus pocket noted on exam. She feels as if Dr.Piscoya should have recognized this and treated appropriately at her last visit as the cultures were done at the wound center prior to her appointment with him. Patient was prescribed antibiotic due to culture growth.  Patient will continue to see the wound care doctor until she is ready for her Radiation treatments. Patient cancelled her follow up appointment with Dr.Piscoya on 01/06/18 and refuses to continue to care/ treatment with him.  Dr.Piscoya will be notified.

## 2018-01-05 NOTE — Telephone Encounter (Signed)
Patient had called this afternoon stating that she wanted to cancel her appointment since she now goes to a wound care center, she did not feel the need to be seen by [place]. However, I called Dr. Hampton Abbot and informed him and he stated that since he was her surgeon, he wanted to continue seeing her wound progress. I then called the patient and had to leave her a detailed message to return my call and that Dr. Hampton Abbot wanted to see her tomorrow morning. Awaiting for patient to call me back.

## 2018-01-06 ENCOUNTER — Encounter: Payer: 59 | Admitting: Surgery

## 2018-01-07 LAB — SPECIMEN STATUS REPORT

## 2018-01-07 LAB — ANAEROBIC CULTURE

## 2018-01-07 NOTE — Progress Notes (Signed)
SIOBAHN, WORSLEY ([id number]) Visit Report for 01/05/2018 Arrival Information Details Patient Name: Holly Bennett, Holly Bennett. Date of Service: 01/05/2018 1:00 PM Medical Record Number: [id number] Patient Account Number: [id number] Date of Birth/Sex: [birth year] (52 y.o. Female) Treating RN: Carolyne Fiscal, Debi Primary Care Azyriah Nevins: Princess Bruins Other Clinician: Referring Chee Dimon: Princess Bruins Treating Ameri Cahoon/Extender: Tito Dine in Treatment: 1 Visit Information History Since Last Visit All ordered tests and consults were completed: No Patient Arrived: Ambulatory Added or deleted any medications: No Arrival Time: 13:09 Any new allergies or adverse reactions: No Accompanied By: self Had a fall or experienced change in No Transfer Assistance: None activities of daily living that may affect Patient Identification Verified: Yes risk of falls: Secondary Verification Process Completed: Yes Signs or symptoms of abuse/neglect since last visito No Patient Requires Transmission-Based No Hospitalized since last visit: No Precautions: Has Dressing in Place as Prescribed: Yes Patient Has Alerts: No Pain Present Now: No Electronic Signature(s) Signed: 01/05/2018 4:40:08 PM By: Alric Quan Entered By: Alric Quan on 01/05/2018 13:09:44 Holly Bennett ([id number]) -------------------------------------------------------------------------------- Clinic Level of Care Assessment Details Patient Name: Holly Bennett Date of Service: 01/05/2018 1:00 PM Medical Record Number: [id number] Patient Account Number: [id number] Date of Birth/Sex: [birth year] (52 y.o. Female) Treating RN: Cornell Barman Primary Care Beldon Nowling: Princess Bruins Other Clinician: Referring Yuleimy Kretz: Princess Bruins Treating Jozlin Bently/Extender: Tito Dine in Treatment: 1 Clinic Level of Care Assessment Items TOOL 4 Quantity Score []  - Use when only an EandM is performed on FOLLOW-UP visit  0 ASSESSMENTS - Nursing Assessment / Reassessment []  - Reassessment of Co-morbidities (includes updates in patient status) 0 X- 1 5 Reassessment of Adherence to Treatment Plan ASSESSMENTS - Wound and Skin Assessment / Reassessment X - Simple Wound Assessment / Reassessment - one wound 1 5 []  - 0 Complex Wound Assessment / Reassessment - multiple wounds []  - 0 Dermatologic / Skin Assessment (not related to wound area) ASSESSMENTS - Focused Assessment []  - Circumferential Edema Measurements - multi extremities 0 []  - 0 Nutritional Assessment / Counseling / Intervention []  - 0 Lower Extremity Assessment (monofilament, tuning fork, pulses) []  - 0 Peripheral Arterial Disease Assessment (using hand held doppler) ASSESSMENTS - Ostomy and/or Continence Assessment and Care []  - Incontinence Assessment and Management 0 []  - 0 Ostomy Care Assessment and Management (repouching, etc.) PROCESS - Coordination of Care X - Simple Patient / Family Education for ongoing care 1 15 []  - 0 Complex (extensive) Patient / Family Education for ongoing care []  - 0 Staff obtains Programmer, systems, Records, Test Results / Process Orders []  - 0 Staff telephones HHA, Nursing Homes / Clarify orders / etc []  - 0 Routine Transfer to another Facility (non-emergent condition) []  - 0 Routine Hospital Admission (non-emergent condition) []  - 0 New Admissions / Biomedical engineer / Ordering NPWT, Apligraf, etc. []  - 0 Emergency Hospital Admission (emergent condition) X- 1 10 Simple Discharge Coordination TEAGHAN, MELROSE. ([id number]) []  - 0 Complex (extensive) Discharge Coordination PROCESS - Special Needs []  - Pediatric / Minor Patient Management 0 []  - 0 Isolation Patient Management []  - 0 Hearing / Language / Visual special needs []  - 0 Assessment of Community assistance (transportation, D/C planning, etc.) []  - 0 Additional assistance / Altered mentation []  - 0 Support Surface(s) Assessment (bed,  cushion, seat, etc.) INTERVENTIONS - Wound Cleansing / Measurement X - Simple Wound Cleansing - one wound 1 5 []  - 0 Complex Wound Cleansing - multiple wounds X- 1 5 Wound Imaging (photographs - any  number of wounds) []  - 0 Wound Tracing (instead of photographs) X- 1 5 Simple Wound Measurement - one wound []  - 0 Complex Wound Measurement - multiple wounds INTERVENTIONS - Wound Dressings []  - Small Wound Dressing one or multiple wounds 0 X- 1 15 Medium Wound Dressing one or multiple wounds []  - 0 Large Wound Dressing one or multiple wounds []  - 0 Application of Medications - topical []  - 0 Application of Medications - injection INTERVENTIONS - Miscellaneous []  - External ear exam 0 []  - 0 Specimen Collection (cultures, biopsies, blood, body fluids, etc.) []  - 0 Specimen(s) / Culture(s) sent or taken to Lab for analysis []  - 0 Patient Transfer (multiple staff / Civil Service fast streamer / Similar devices) []  - 0 Simple Staple / Suture removal (25 or less) []  - 0 Complex Staple / Suture removal (26 or more) []  - 0 Hypo / Hyperglycemic Management (close monitor of Blood Glucose) []  - 0 Ankle / Brachial Index (ABI) - do not check if billed separately X- 1 5 Vital Signs Fenlon, Mindee H. ([id number]) Has the patient been seen at the hospital within the last three years: Yes Total Score: 70 Level Of Care: New/Established - Level 2 Electronic Signature(s) Signed: 01/05/2018 5:38:59 PM By: Gretta Cool, BSN, RN, CWS, Kim RN, BSN Entered By: Gretta Cool, BSN, RN, CWS, Kim on 01/05/2018 13:54:32 Holly Bennett ([id number]) -------------------------------------------------------------------------------- Encounter Discharge Information Details Patient Name: Holly Bennett Date of Service: 01/05/2018 1:00 PM Medical Record Number: [id number] Patient Account Number: [id number] Date of Birth/Sex: December 08, 1965 (52 y.o. Female) Treating RN: Cornell Barman Primary Care Jalal Rauch: Princess Bruins Other  Clinician: Referring Shenika Quint: Princess Bruins Treating Dawan Farney/Extender: Tito Dine in Treatment: 1 Encounter Discharge Information Items Discharge Pain Level: 0 Discharge Condition: Stable Ambulatory Status: Ambulatory Discharge Destination: Home Transportation: Private Auto Accompanied By: self Schedule Follow-up Appointment: Yes Medication Reconciliation completed and No provided to Patient/Care Jonquil Stubbe: Provided on Clinical Summary of Care: 01/05/2018 Form Type Recipient Paper Patient JW Electronic Signature(s) Signed: 01/05/2018 2:09:01 PM By: Montey Hora Entered By: Montey Hora on 01/05/2018 14:09:01 Holly Bennett ([id number]) -------------------------------------------------------------------------------- Lower Extremity Assessment Details Patient Name: Holly Bennett. Date of Service: 01/05/2018 1:00 PM Medical Record Number: [id number] Patient Account Number: [id number] Date of Birth/Sex: May 30, 1966 (52 y.o. Female) Treating RN: Carolyne Fiscal, Debi Primary Care Ashante Snelling: Princess Bruins Other Clinician: Referring Anmol Paschen: Princess Bruins Treating Jaycie Kregel/Extender: Ricard Dillon Weeks in Treatment: 1 Electronic Signature(s) Signed: 01/05/2018 4:40:08 PM By: Alric Quan Entered By: Alric Quan on 01/05/2018 13:14:18 Holly Bennett ([id number]) -------------------------------------------------------------------------------- Multi Wound Chart Details Patient Name: Holly Bennett Date of Service: 01/05/2018 1:00 PM Medical Record Number: [id number] Patient Account Number: [id number] Date of Birth/Sex: 05/15/1966 (52 y.o. Female) Treating RN: Cornell Barman Primary Care Caroleena Paolini: Princess Bruins Other Clinician: Referring Kaisa Wofford: Princess Bruins Treating Emmerson Shuffield/Extender: Tito Dine in Treatment: 1 Vital Signs Height(in): 64 Pulse(bpm): 84 Weight(lbs): 151.6 Blood Pressure(mmHg): 144/74 Body Mass  Index(BMI): 26 Temperature(F): 98.0 Respiratory Rate 18 (breaths/min): Photos: [1:No Photos] [N/A:N/A] Wound Location: [1:Left Breast] [N/A:N/A] Wounding Event: [1:Surgical Injury] [N/A:N/A] Primary Etiology: [1:Dehisced Wound] [N/A:N/A] Comorbid History: [1:Received Radiation] [N/A:N/A] Date Acquired: [1:12/08/2017] [N/A:N/A] Weeks of Treatment: [1:1] [N/A:N/A] Wound Status: [1:Open] [N/A:N/A] Measurements L x W x D [1:0.3x1x1.8] [N/A:N/A] (cm) Area (cm) : [1:0.236] [N/A:N/A] Volume (cm) : [1:0.424] [N/A:N/A] % Reduction in Area: [1:67.40%] [N/A:N/A] % Reduction in Volume: [1:51.10%] [N/A:N/A] Classification: [1:Full Thickness Without Exposed Support Structures] [N/A:N/A] Exudate Amount: [1:Large] [N/A:N/A] Exudate Type: [1:Serosanguineous] [N/A:N/A] Exudate Color: [1:red, brown] [  N/A:N/A] Foul Odor After Cleansing: [1:Yes] [N/A:N/A] Odor Anticipated Due to [1:No] [N/A:N/A] Product Use: Wound Margin: [1:Distinct, outline attached] [N/A:N/A] Granulation Amount: [1:Large (67-100%)] [N/A:N/A] Granulation Quality: [1:Red] [N/A:N/A] Necrotic Amount: [1:Small (1-33%)] [N/A:N/A] Necrotic Tissue: [1:Eschar, Adherent Slough] [N/A:N/A] Exposed Structures: [1:Fat Layer (Subcutaneous Tissue) Exposed: Yes Fascia: No Tendon: No Muscle: No Joint: No Bone: No] [N/A:N/A] Epithelialization: [1:None] [N/A:N/A] Periwound Skin Texture: [1:No Abnormalities Noted] [N/A:N/A] Periwound Skin Moisture: [1:Maceration: Yes] [N/A:N/A] Periwound Skin Color: No Abnormalities Noted N/A N/A Temperature: No Abnormality N/A N/A Tenderness on Palpation: Yes N/A N/A Wound Preparation: Ulcer Cleansing: N/A N/A Rinsed/Irrigated with Saline Topical Anesthetic Applied: Other: lidocaine 4% Treatment Notes Wound #1 (Left Breast) 1. Cleansed with: Clean wound with Normal Saline 2. Anesthetic Topical Lidocaine 4% cream to wound bed prior to debridement 4. Dressing Applied: Other dressing (specify in  notes) 5. Secondary Dressing Applied Bordered Foam Dressing Notes silvercel Electronic Signature(s) Signed: 01/05/2018 5:42:24 PM By: Linton Ham MD Entered By: Linton Ham on 01/05/2018 14:56:53 Holly Bennett ([id number]) -------------------------------------------------------------------------------- Multi-Disciplinary Care Plan Details Patient Name: Holly Bennett Date of Service: 01/05/2018 1:00 PM Medical Record Number: [id number] Patient Account Number: [id number] Date of Birth/Sex: August 24, 1966 (52 y.o. Female) Treating RN: Cornell Barman Primary Care Crystalynn Mcinerney: Princess Bruins Other Clinician: Referring Donyea Beverlin: Princess Bruins Treating Nabeel Gladson/Extender: Tito Dine in Treatment: 1 Active Inactive ` Malignancy/Atypical Etiology Nursing Diagnoses: Knowledge deficit related to disease process and management of atypical ulcer etiology Goals: Patient/caregiver will verbalize understanding of disease process and disease management of malignancy Date Initiated: 12/29/2017 Target Resolution Date: 01/28/2018 Goal Status: Active Interventions: Provide education on malignant ulcerations Notes: ` Necrotic Tissue Nursing Diagnoses: Impaired tissue integrity related to necrotic/devitalized tissue Goals: Necrotic/devitalized tissue will be minimized in the wound bed Date Initiated: 12/29/2017 Target Resolution Date: 01/28/2018 Goal Status: Active Interventions: Assess patient pain level pre-, during and post procedure and prior to discharge Treatment Activities: Apply topical anesthetic as ordered : 12/29/2017 Notes: ` Orientation to the Wound Care Program Nursing Diagnoses: Knowledge deficit related to the wound healing center program Goals: Patient/caregiver will verbalize understanding of the [place] Date Initiated: 12/29/2017 Target Resolution Date: 01/28/2018 GWENN, TEODORO ([id number]) Goal Status: Active Interventions: Provide  education on orientation to the wound center Notes: Electronic Signature(s) Signed: 01/05/2018 5:38:59 PM By: Gretta Cool, BSN, RN, CWS, Kim RN, BSN Entered By: Gretta Cool, BSN, RN, CWS, Kim on 01/05/2018 13:48:55 Holly Bennett ([id number]) -------------------------------------------------------------------------------- Pain Assessment Details Patient Name: Holly Bennett Date of Service: 01/05/2018 1:00 PM Medical Record Number: [id number] Patient Account Number: [id number] Date of Birth/Sex: Nov 10, 1965 (52 y.o. Female) Treating RN: Carolyne Fiscal, Debi Primary Care Mykah Bellomo: Princess Bruins Other Clinician: Referring Audreyanna Butkiewicz: Princess Bruins Treating Rotunda Worden/Extender: Ricard Dillon Weeks in Treatment: 1 Active Problems Location of Pain Severity and Description of Pain Patient Has Paino No Site Locations Pain Management and Medication Current Pain Management: Electronic Signature(s) Signed: 01/05/2018 4:40:08 PM By: Alric Quan Entered By: Alric Quan on 01/05/2018 13:10:40 Holly Bennett ([id number]) -------------------------------------------------------------------------------- Patient/Caregiver Education Details Patient Name: Holly Bennett Date of Service: 01/05/2018 1:00 PM Medical Record Number: [id number] Patient Account Number: [id number] Date of Birth/Gender: 1966/07/10 (53 y.o. Female) Treating RN: Montey Hora Primary Care Physician: Princess Bruins Other Clinician: Referring Physician: Princess Bruins Treating Physician/Extender: Tito Dine in Treatment: 1 Education Assessment Education Provided To: Patient Education Topics Provided Wound/Skin Impairment: Handouts: Other: wound care as ordered, reportable s/s Methods: Demonstration, Explain/Verbal Responses: State content correctly Electronic Signature(s) Signed: 01/05/2018 5:19:26 PM  By: Montey Hora Entered By: Montey Hora on 01/05/2018 14:09:29 Holly Bennett  ([id number]) -------------------------------------------------------------------------------- Wound Assessment Details Patient Name: Holly Bennett. Date of Service: 01/05/2018 1:00 PM Medical Record Number: [id number] Patient Account Number: [id number] Date of Birth/Sex: 22-Jan-1966 (52 y.o. Female) Treating RN: Carolyne Fiscal, Debi Primary Care Croix Presley: Princess Bruins Other Clinician: Referring Saralee Bolick: Princess Bruins Treating Niquan Charnley/Extender: Ricard Dillon Weeks in Treatment: 1 Wound Status Wound Number: 1 Primary Etiology: Dehisced Wound Wound Location: Left Breast Wound Status: Open Wounding Event: Surgical Injury Comorbid History: Received Radiation Date Acquired: 12/08/2017 Weeks Of Treatment: 1 Clustered Wound: No Photos Photo Uploaded By: Alric Quan on 01/05/2018 17:19:46 Wound Measurements Length: (cm) 0.3 Width: (cm) 1 Depth: (cm) 1.8 Area: (cm) 0.236 Volume: (cm) 0.424 % Reduction in Area: 67.4% % Reduction in Volume: 51.1% Epithelialization: None Tunneling: No Undermining: No Wound Description Full Thickness Without Exposed Support Classification: Structures Wound Margin: Distinct, outline attached Exudate Large Amount: Exudate Type: Serosanguineous Exudate Color: red, brown Foul Odor After Cleansing: Yes Due to Product Use: No Slough/Fibrino Yes Wound Bed Granulation Amount: Large (67-100%) Exposed Structure Granulation Quality: Red Fascia Exposed: No Necrotic Amount: Small (1-33%) Fat Layer (Subcutaneous Tissue) Exposed: Yes Necrotic Quality: Eschar, Adherent Slough Tendon Exposed: No Muscle Exposed: No Joint Exposed: No Bone Exposed: No Saltsman, Layaan H. ([id number]) Periwound Skin Texture Texture Color No Abnormalities Noted: No No Abnormalities Noted: No Moisture Temperature / Pain No Abnormalities Noted: No Temperature: No Abnormality Maceration: Yes Tenderness on Palpation: Yes Wound Preparation Ulcer Cleansing:  Rinsed/Irrigated with Saline Topical Anesthetic Applied: Other: lidocaine 4%, Treatment Notes Wound #1 (Left Breast) 1. Cleansed with: Clean wound with Normal Saline 2. Anesthetic Topical Lidocaine 4% cream to wound bed prior to debridement 4. Dressing Applied: Other dressing (specify in notes) 5. Secondary Dressing Applied Bordered Foam Dressing Notes silvercel Electronic Signature(s) Signed: 01/05/2018 4:40:08 PM By: Alric Quan Entered By: Alric Quan on 01/05/2018 13:17:46 Holly Bennett ([id number]) -------------------------------------------------------------------------------- [place] Details Patient Name: Holly Bennett Date of Service: 01/05/2018 1:00 PM Medical Record Number: [id number] Patient Account Number: [id number] Date of Birth/Sex: [birth year] (52 y.o. Female) Treating RN: Carolyne Fiscal, Debi Primary Care [place] Wanner: Princess Bruins Other Clinician: Referring Coley Littles: Princess Bruins Treating Gricelda Foland/Extender: Ricard Dillon Weeks in Treatment: 1 Vital Signs Time Taken: 13:10 Temperature (F): 98.0 Height (in): 64 Pulse (bpm): 69 Weight (lbs): 151.6 Respiratory Rate (breaths/min): 18 Body Mass Index (BMI): 26 Blood Pressure (mmHg): 144/74 Reference Range: 80 - 120 mg / dl Electronic Signature(s) Signed: 01/05/2018 4:40:08 PM By: Alric Quan Entered By: Alric Quan on 01/05/2018 13:11:11

## 2018-01-07 NOTE — Progress Notes (Signed)
Holly, Bennett ([id number]) Visit Report for 01/05/2018 HPI Details Patient Name: Holly Bennett, Holly Bennett. Date of Service: 01/05/2018 1:00 PM Medical Record Number: [id number] Patient Account Number: [id number] Date of Birth/Sex: 1966/08/19 (52 y.o. Female) Treating RN: Cornell Barman Primary Care Provider: Princess Bruins Other Clinician: Referring Provider: Princess Bruins Treating Provider/Extender: Ricard Dillon Weeks in Treatment: 1 History of Present Illness HPI Description: CLINICAL DATA: History of LEFT lumpectomy 11/17/2017. Patient had spontaneous opening of the lumpectomy cavity and continues to have fluid from the lumpectomy scar. She is on her 2nd round of antibiotics. LEFT breast remains painful. o EXAM: ULTRASOUND OF THE LEFT BREAST o COMPARISON: 11/17/2017 o FINDINGS: On physical exam, there is a healing wound in the 12 o'clock location of the LEFT breast. Patient is tender on exam. o Targeted ultrasound is performed, showing a hypoechoic collection deep to the scar in the 12:30 o'clock location of the LEFT breast 1 centimeter from the nipple. This collection shows mobile internal debris on real-time evaluation and measures 3.6 x 1.0 x 2.3 centimeters. o IMPRESSION: Seroma versus abscess in the UPPER portion of the LEFT breast. o RECOMMENDATION: Ultrasound-guided aspiration of LEFT fluid collection. o Continued antibiotic treatment. o I have discussed the findings and recommendations with the patient. Results were also provided in writing at the conclusion of the visit. If applicable, a reminder letter will be sent to the patient regarding the next appointment. o BI-RADS CATEGORY 2: Benign. o o Electronically Signed By: Nolon Nations M.D. On: 12/16/2017 10:31 [place] [facility] 12/20/2017 HEYLI, MIN ([id number]) o HPI: Patient is s/p left breast wire localized lumpectomy and sentinel node biopsy for left breast cancer on 1/23. Her postop  course was complicated by wound dehiscense with eschar formation, likely due to reaction to the blue dye during node biopsy. She was started on Silvadene last week by Dr. Adonis Huguenin and she also has completed the abx course started the week prior. She reports the wound continues to heal well and she's still having some serosanguinous drainage, though also some blue dye remnant staining on the gauze. Denies any fevers, chills. o Vital signs: Wt 68.9 kg (152 lb)  BMI 26.09 kg/mo o Physical Exam: Constitutional: No acute distress Breast: Left breast incision healing better, with eschar now off, with some fibrinous material at the inferior portion of the wound. This was sharply excised at bedside. The other side of the wound has healthy granulation tissue. No evidence of infection. There is still induration of the skin surrounding the nipple/areola complex, but improved compared to how it was on the last visit with me. o Assessment/Plan: 52 yo female s/p left breast wire-localized lumpectomy and sentinel node biopsy o --sharply excised fibrinous material over inferior portion of wound. Rest of wound healing well. There is still a small cavity but healing well overall. --continue dry gauze dressing changes daily and as needed. --continue silvadene ointment --will follow up next week with me. o o [name], [place] 12/29/17; relevant information above including her breast ultrasound in last note from her surgeon. 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 She was discovered to have a malignant neoplasm in the central portion of her left breast by routine mammography. She underwent a left breast lumpectomy with axillary node biopsy on 11/17/17; apparently the margins of this were clean. Some postoperative discomfort and redness which she really didn't think was out of the ordinary however 2 weeks later she is able  to show [place] a picture of a small area in the surgical incision with a necrotic-looking wound bed.the wound started draining. the wound progressed and dehisced. She had an ultrasound of the left breast on 2/21 and fluid was aspirated that apparently cultured Klebsiella oxytocin. She was given antibiotics although at this point I am not quite sure which antibiotics were prescribed. In any case the wound is had progressive opening. She had some debridement by her surgeon. She has been using Silvadene cream to the wound area. She is not having unbearable pain however she finds it difficult to be able to work. Surgery felt that this might be a reaction to the "blue dye" used for lymph node biopsy. I told the patient I have no experience with this. In any case she is been left with a fairly sizable opening in the left breast currently with a depth of 1.2 cm from 3 to 9:00 3 cm of undermining. She has not been systemically unwell 01/05/18; culture of the purulent drainage I did last week largely from the lateral part of the open wound grew a few methicillin sensitive staph aureus. She is on Septra DS 1 by mouth twice a day for 10 days. She states she is already noted improvement. We packed the wound with silver alginate rope. The wound is contracted nicely and there is certainly less drainage although still 0.8 cm in depth. Electronic Signature(s) Signed: 01/05/2018 5:42:24 PM By: Linton Ham MD Entered By: Linton Ham on 01/05/2018 14:58:22 Holly Bennett ([id number]) -------------------------------------------------------------------------------- Physical Exam Details Patient Name: Holly Bennett Date of Service: 01/05/2018 1:00 PM Medical Record Number: [id number] Patient Account Number: [id number] Date of Birth/Sex: 09-Aug-1966 (52 y.o. Female) Treating RN: Cornell Barman Primary Care Provider: Princess Bruins Other Clinician: Referring Provider: Princess Bruins Treating Provider/Extender:  Ricard Dillon Weeks in Treatment: 1 Constitutional Patient is hypertensive.. Pulse regular and within target range for patient.Marland Kitchen Respirations regular, non-labored and within target range.. Temperature is normal and within the target range for the patient.Marland Kitchen appears in no distress. Eyes Conjunctivae clear. No discharge. Respiratory Respiratory effort is easy and symmetric bilaterally. Rate is normal at rest and on room air.. Chest surgical wound appears much smaller. the wound orifice and overall wound areas a lot better although this still has 0.8 cm in depth. There is no palpable mass. Lymphatic none palpable in the left axilla. Integumentary (Hair, Skin) skin around the wound looks healthy. Psychiatric No evidence of depression, anxiety, or agitation. Calm, cooperative, and communicative. Appropriate interactions and affect.. Notes wound exam; left breast the surgical wound in the mid part of the left breast just above the nipple. This still has 0.8 cm in depth however I was unable to document the undermining laterally. There is still some serosanguineous drainage but not nearly as much as last time. What I can see of the tissue through the small orifice appears healthy. There is no palpable mass or tenderness. Electronic Signature(s) Signed: 01/05/2018 5:42:24 PM By: Linton Ham MD Entered By: Linton Ham on 01/05/2018 15:00:27 Holly Bennett ([id number]) -------------------------------------------------------------------------------- Physician Orders Details Patient Name: Holly Bennett Date of Service: 01/05/2018 1:00 PM Medical Record Number: [id number] Patient Account Number: [id number] Date of Birth/Sex: 12/17/1965 (52 y.o. Female) Treating RN: Cornell Barman Primary Care Provider: Princess Bruins Other Clinician: Referring Provider: Princess Bruins Treating Provider/Extender: Tito Dine in Treatment: 1 Verbal / Phone Orders: No Diagnosis  Coding Wound Cleansing Wound #1 Left Breast o Cleanse wound with mild soap and water  Anesthetic (add to Medication List) Wound #1 Left Breast o Topical Lidocaine 4% cream applied to wound bed prior to debridement (In Clinic Only). Primary Wound Dressing Wound #1 Left Breast o Silvercel Non-Adherent Secondary Dressing Wound #1 Left Breast o Boardered Foam Dressing Dressing Change Frequency Wound #1 Left Breast o Change dressing every day. Follow-up Appointments Wound #1 Left Breast o Return Appointment in 1 week. Patient Medications Allergies: penicillin, blue dye (parenteral) Notifications Medication Indication Start End lidocaine DOSE topical 4 % cream - cream topical Electronic Signature(s) Signed: 01/05/2018 5:38:59 PM By: Gretta Cool, BSN, RN, CWS, Kim RN, BSN Signed: 01/05/2018 5:42:24 PM By: Linton Ham MD Entered By: Gretta Cool, BSN, RN, CWS, Kim on 01/05/2018 13:53:10 KARSYN, JAMIE ([id number]) -------------------------------------------------------------------------------- Prescription 01/05/2018 Patient Name: Holly Bennett. Provider: Ricard Dillon MD Date of Birth: 1965/11/02 NPI#: [id number] Sex: F DEA#: [id number] Phone #: [phone] License #: [id number] Patient Address: [facility] [place], [place] [address] [address], [place], [place] [address] [phone] Allergies penicillin blue dye (parenteral) Medication Medication: Route: Strength: Form: lidocaine topical 4% cream Class: TOPICAL LOCAL ANESTHETICS Dose: Frequency / Time: Indication: cream topical Number of Refills: Number of Units: 0 Generic Substitution: Start Date: End Date: Administered at [facility]: Yes Time Administered: Time Discontinued: Note to Pharmacy: Signature(s): Date(s): Electronic Signature(s) Signed: 01/05/2018 5:38:59 PM By: Gretta Cool, BSN, RN, CWS,  Kim RN, BSN Signed: 01/05/2018 5:42:24 PM By: Linton Ham MD VINISHA, FAXON ([id number]) Entered By: Gretta Cool, BSN, RN, CWS, Kim on 01/05/2018 13:53:10 Holly Bennett ([id number]) --------------------------------------------------------------------------------  Problem List Details Patient Name: JAMAR, CASAGRANDE. Date of Service: 01/05/2018 1:00 PM Medical Record Number: [id number] Patient Account Number: [id number] Date of Birth/Sex: 1966/02/17 (51 y.o. Female) Treating RN: Cornell Barman Primary Care Provider: Princess Bruins Other Clinician: Referring Provider: Princess Bruins Treating Provider/Extender: Tito Dine in Treatment: 1 Active Problems ICD-10 Encounter Code Description Active Date Diagnosis T81.31XD Disruption of external operation (surgical) wound, not elsewhere 12/29/2017 Yes classified, subsequent encounter C50.112 Malignant neoplasm of central portion of left female breast 12/29/2017 Yes S21.002D Unspecified open wound of left breast, subsequent encounter 12/29/2017 Yes Inactive Problems Resolved Problems Electronic Signature(s) Signed: 01/05/2018 5:42:24 PM By: Linton Ham MD Entered By: Linton Ham on 01/05/2018 14:56:37 Holly Bennett ([id number]) -------------------------------------------------------------------------------- Progress Note Details Patient Name: Holly Bennett Date of Service: 01/05/2018 1:00 PM Medical Record Number: [id number] Patient Account Number: [id number] Date of Birth/Sex: 26-Sep-1966 (52 y.o. Female) Treating RN: Cornell Barman Primary Care Provider: Princess Bruins Other Clinician: Referring Provider: Princess Bruins Treating Provider/Extender: Ricard Dillon Weeks in Treatment: 1 Subjective History of Present Illness (HPI) CLINICAL DATA: History of LEFT lumpectomy 11/17/2017. Patient had spontaneous opening of the lumpectomy cavity and continues to have fluid from the lumpectomy scar. She is on her 2nd round  of antibiotics. LEFT breast remains painful. EXAM: ULTRASOUND OF THE LEFT BREAST COMPARISON: 11/17/2017 FINDINGS: On physical exam, there is a healing wound in the 12 o'clock location of the LEFT breast. Patient is tender on exam. Targeted ultrasound is performed, showing a hypoechoic collection deep to the scar in the 12:30 o'clock location of the LEFT breast 1 centimeter from the nipple. This collection shows mobile internal debris on real-time evaluation and measures 3.6 x 1.0 x 2.3 centimeters. IMPRESSION: Seroma versus abscess in the UPPER portion of the LEFT breast. RECOMMENDATION: Ultrasound-guided aspiration of LEFT fluid collection. Continued antibiotic treatment. I have discussed the findings  and recommendations with the patient. Results were also provided in writing at the conclusion of the visit. If applicable, a reminder letter will be sent to the patient regarding the next appointment. BI-RADS CATEGORY 2: Benign. Electronically Signed By: Nolon Nations M.D. On: 12/16/2017 10:31 [place] [facility] 12/20/2017 HPI: VINEY, ACOCELLA ([id number]) Patient is s/p left breast wire localized lumpectomy and sentinel node biopsy for left breast cancer on 1/23. Her postop course was complicated by wound dehiscense with eschar formation, likely due to reaction to the blue dye during node biopsy. She was started on Silvadene last week by Dr. Adonis Huguenin and she also has completed the abx course started the week prior. She reports the wound continues to heal well and she's still having some serosanguinous drainage, though also some blue dye remnant staining on the gauze. Denies any fevers, chills. Vital signs: Wt 68.9 kg (152 lb)  BMI 26.09 kg/m Physical Exam: Constitutional: No acute distress Breast: Left breast incision healing better, with eschar now off, with some fibrinous material at the inferior portion of the wound. This was sharply excised at bedside. The other side of  the wound has healthy granulation tissue. No evidence of infection. There is still induration of the skin surrounding the nipple/areola complex, but improved compared to how it was on the last visit with me. Assessment/Plan: 52 yo female s/p left breast wire-localized lumpectomy and sentinel node biopsy --sharply excised fibrinous material over inferior portion of wound. Rest of wound healing well. There is still a small cavity but healing well overall. --continue dry gauze dressing changes daily and as needed. --continue silvadene ointment --will follow up next week with me. [name], [place] 12/29/17; relevant information above including her breast ultrasound in last note from her surgeon. 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 She was discovered to have a malignant neoplasm in the central portion of her left breast by routine mammography. She underwent a left breast lumpectomy with axillary node biopsy on 11/17/17; apparently the margins of this were clean. Some postoperative discomfort and redness which she really didn't think was out of the ordinary however 2 weeks later she is able to show [place] a picture of a small area in the surgical incision with a necrotic-looking wound bed.the wound started draining. the wound progressed and dehisced. She had an ultrasound of the left breast on 2/21 and fluid was aspirated that apparently cultured Klebsiella oxytocin. She was given antibiotics although at this point I am not quite sure which antibiotics were prescribed. In any case the wound is had progressive opening. She had some debridement by her surgeon. She has been using Silvadene cream to the wound area. She is not having unbearable pain however she finds it difficult to be able to work. Surgery felt that this might be a reaction to the "blue dye" used for lymph node biopsy. I told the patient I have no  experience with this. In any case she is been left with a fairly sizable opening in the left breast currently with a depth of 1.2 cm from 3 to 9:00 3 cm of undermining. She has not been systemically unwell 01/05/18; culture of the purulent drainage I did last week largely from the lateral part of the open wound grew a few methicillin sensitive staph aureus. She is on Septra DS 1 by mouth twice a day for 10 days. She states she is already noted improvement. We packed the wound with silver  alginate rope. The wound is contracted nicely and there is certainly less drainage although still 0.8 cm in depth. Objective LINDZY, RUPERT ([id number]) Constitutional Patient is hypertensive.. Pulse regular and within target range for patient.Marland Kitchen Respirations regular, non-labored and within target range.. Temperature is normal and within the target range for the patient.Marland Kitchen appears in no distress. Vitals Time Taken: 1:10 PM, Height: 64 in, Weight: 151.6 lbs, BMI: 26, Temperature: 98.0 F, Pulse: 69 bpm, Respiratory Rate: 18 breaths/min, Blood Pressure: 144/74 mmHg. Eyes Conjunctivae clear. No discharge. Respiratory Respiratory effort is easy and symmetric bilaterally. Rate is normal at rest and on room air.. Chest surgical wound appears much smaller. the wound orifice and overall wound areas a lot better although this still has 0.8 cm in depth. There is no palpable mass. Lymphatic none palpable in the left axilla. Psychiatric No evidence of depression, anxiety, or agitation. Calm, cooperative, and communicative. Appropriate interactions and affect.. General Notes: wound exam; left breast the surgical wound in the mid part of the left breast just above the nipple. This still has 0.8 cm in depth however I was unable to document the undermining laterally. There is still some serosanguineous drainage but not nearly as much as last time. What I can see of the tissue through the small orifice appears healthy. There  is no palpable mass or tenderness. Integumentary (Hair, Skin) skin around the wound looks healthy. Wound #1 status is Open. Original cause of wound was Surgical Injury. The wound is located on the Left Breast. The wound measures 0.3cm length x 1cm width x 1.8cm depth; 0.236cm^2 area and 0.424cm^3 volume. There is Fat Layer (Subcutaneous Tissue) Exposed exposed. There is no tunneling or undermining noted. There is a large amount of serosanguineous drainage noted. Foul odor after cleansing was noted. The wound margin is distinct with the outline attached to the wound base. There is large (67-100%) red granulation within the wound bed. There is a small (1-33%) amount of necrotic tissue within the wound bed including Eschar and Adherent Slough. The periwound skin appearance exhibited: Maceration. Periwound temperature was noted as No Abnormality. The periwound has tenderness on palpation. Assessment Active Problems ICD-10 T81.31XD - Disruption of external operation (surgical) wound, not elsewhere classified, subsequent encounter C50.112 - Malignant neoplasm of central portion of left female breast S21.002D - Unspecified open wound of left breast, subsequent encounter RENLEY, GUTMAN ([id number]) Plan Wound Cleansing: Wound #1 Left Breast: Cleanse wound with mild soap and water Anesthetic (add to Medication List): Wound #1 Left Breast: Topical Lidocaine 4% cream applied to wound bed prior to debridement (In Clinic Only). Primary Wound Dressing: Wound #1 Left Breast: Silvercel Non-Adherent Secondary Dressing: Wound #1 Left Breast: Boardered Foam Dressing Dressing Change Frequency: Wound #1 Left Breast: Change dressing every day. Follow-up Appointments: Wound #1 Left Breast: Return Appointment in 1 week. The following medication(s) was prescribed: lidocaine topical 4 % cream cream topical was prescribed at facility #1 we will continue with the antibiotics as ordered Septra DS for 10  days. #2 the wound already looks quite a bit better. I don't want this to close with a subcutaneous cavity however. Continue silver alginate #3 I had some thoughts about a wound VAC although I've never put a wound VAC on on a breast red notes that this can be done although I wonder about putting over her nipple area. Fortunately this does not appear to be Runner, broadcasting/film/video) Signed: 01/05/2018 5:42:24 PM By: Linton Ham MD Entered By: Linton Ham on 01/05/2018 15:02:31  ROCHELL, PUETT ([id number]) -------------------------------------------------------------------------------- SuperBill Details Patient Name: MIDGE, MOMON. Date of Service: 01/05/2018 Medical Record Number: [id number] Patient Account Number: [id number] Date of Birth/Sex: 02/27/1966 (52 y.o. Female) Treating RN: Cornell Barman Primary Care Provider: Princess Bruins Other Clinician: Referring Provider: Princess Bruins Treating Provider/Extender: Tito Dine in Treatment: 1 Diagnosis Coding ICD-10 Codes Code Description T81.31XD Disruption of external operation (surgical) wound, not elsewhere classified, subsequent encounter C50.112 Malignant neoplasm of central portion of left female breast S21.002D Unspecified open wound of left breast, subsequent encounter Facility Procedures CPT4 Code: [id number] Description: [phone] - WOUND CARE VISIT-LEV 2 EST PT Modifier: Quantity: 1 Physician Procedures CPT4: Description Modifier Quantity Code [id number] - WC PHYS LEVEL 3 - EST PT 1 ICD-10 Diagnosis Description T81.31XD Disruption of external operation (surgical) wound, not elsewhere classified, subsequent encounter S21.002D Unspecified open wound of  left breast, subsequent encounter Electronic Signature(s) Signed: 01/05/2018 5:42:24 PM By: Linton Ham MD Entered By: Linton Ham on 01/05/2018 15:02:55

## 2018-01-12 ENCOUNTER — Encounter: Payer: 59 | Admitting: Nurse Practitioner

## 2018-01-12 DIAGNOSIS — T8131XA Disruption of external operation (surgical) wound, not elsewhere classified, initial encounter: Secondary | ICD-10-CM | POA: Diagnosis not present

## 2018-01-12 DIAGNOSIS — T8131XD Disruption of external operation (surgical) wound, not elsewhere classified, subsequent encounter: Secondary | ICD-10-CM | POA: Diagnosis not present

## 2018-01-14 NOTE — Progress Notes (Signed)
DEVINNE, EPSTEIN ([id number]) Visit Report for 01/12/2018 Arrival Information Details Patient Name: Holly Bennett, Holly Bennett. Date of Service: 01/12/2018 1:45 PM Medical Record Number: [id number] Patient Account Number: [id number] Date of Birth/Sex: July 21, 1966 (52 y.o. Female) Treating RN: Montey Hora Primary Care Erikka Follmer: Princess Bruins Other Clinician: Referring Matt Delpizzo: Princess Bruins Treating Vallarie Fei/Extender: Cathie Olden in Treatment: 2 Visit Information History Since Last Visit Added or deleted any medications: No Patient Arrived: Ambulatory Any new allergies or adverse reactions: No Arrival Time: 14:08 Had a fall or experienced change in No Accompanied By: self activities of daily living that may affect Transfer Assistance: None risk of falls: Patient Identification Verified: Yes Signs or symptoms of abuse/neglect since last visito No Secondary Verification Process Completed: Yes Hospitalized since last visit: No Patient Requires Transmission-Based No Has Dressing in Place as Prescribed: Yes Precautions: Pain Present Now: No Patient Has Alerts: No Electronic Signature(s) Signed: 01/12/2018 5:43:37 PM By: Montey Hora Entered By: Montey Hora on 01/12/2018 14:09:07 Holly Bennett ([id number]) -------------------------------------------------------------------------------- Clinic Level of Care Assessment Details Patient Name: Holly Bennett Date of Service: 01/12/2018 1:45 PM Medical Record Number: [id number] Patient Account Number: [id number] Date of Birth/Sex: [birth year] (52 y.o. Female) Treating RN: Montey Hora Primary Care Leili Eskenazi: Princess Bruins Other Clinician: Referring Ernest Orr: Princess Bruins Treating Monik Lins/Extender: Cathie Olden in Treatment: 2 Clinic Level of Care Assessment Items TOOL 4 Quantity Score []  - Use when only an EandM is performed on FOLLOW-UP visit 0 ASSESSMENTS - Nursing Assessment / Reassessment X -  Reassessment of Co-morbidities (includes updates in patient status) 1 10 X- 1 5 Reassessment of Adherence to Treatment Plan ASSESSMENTS - Wound and Skin Assessment / Reassessment X - Simple Wound Assessment / Reassessment - one wound 1 5 []  - 0 Complex Wound Assessment / Reassessment - multiple wounds []  - 0 Dermatologic / Skin Assessment (not related to wound area) ASSESSMENTS - Focused Assessment []  - Circumferential Edema Measurements - multi extremities 0 []  - 0 Nutritional Assessment / Counseling / Intervention []  - 0 Lower Extremity Assessment (monofilament, tuning fork, pulses) []  - 0 Peripheral Arterial Disease Assessment (using hand held doppler) ASSESSMENTS - Ostomy and/or Continence Assessment and Care []  - Incontinence Assessment and Management 0 []  - 0 Ostomy Care Assessment and Management (repouching, etc.) PROCESS - Coordination of Care X - Simple Patient / Family Education for ongoing care 1 15 []  - 0 Complex (extensive) Patient / Family Education for ongoing care []  - 0 Staff obtains Programmer, systems, Records, Test Results / Process Orders []  - 0 Staff telephones HHA, Nursing Homes / Clarify orders / etc []  - 0 Routine Transfer to another Facility (non-emergent condition) []  - 0 Routine Hospital Admission (non-emergent condition) []  - 0 New Admissions / Biomedical engineer / Ordering NPWT, Apligraf, etc. []  - 0 Emergency Hospital Admission (emergent condition) X- 1 10 Simple Discharge Coordination Holly Bennett, Holly Bennett. ([id number]) []  - 0 Complex (extensive) Discharge Coordination PROCESS - Special Needs []  - Pediatric / Minor Patient Management 0 []  - 0 Isolation Patient Management []  - 0 Hearing / Language / Visual special needs []  - 0 Assessment of Community assistance (transportation, D/C planning, etc.) []  - 0 Additional assistance / Altered mentation []  - 0 Support Surface(s) Assessment (bed, cushion, seat, etc.) INTERVENTIONS - Wound Cleansing /  Measurement X - Simple Wound Cleansing - one wound 1 5 []  - 0 Complex Wound Cleansing - multiple wounds X- 1 5 Wound Imaging (photographs - any number of wounds) []  - 0 Wound Tracing (instead  of photographs) X- 1 5 Simple Wound Measurement - one wound []  - 0 Complex Wound Measurement - multiple wounds INTERVENTIONS - Wound Dressings X - Small Wound Dressing one or multiple wounds 1 10 []  - 0 Medium Wound Dressing one or multiple wounds []  - 0 Large Wound Dressing one or multiple wounds []  - 0 Application of Medications - topical []  - 0 Application of Medications - injection INTERVENTIONS - Miscellaneous []  - External ear exam 0 []  - 0 Specimen Collection (cultures, biopsies, blood, body fluids, etc.) []  - 0 Specimen(s) / Culture(s) sent or taken to Lab for analysis []  - 0 Patient Transfer (multiple staff / Civil Service fast streamer / Similar devices) []  - 0 Simple Staple / Suture removal (25 or less) []  - 0 Complex Staple / Suture removal (26 or more) []  - 0 Hypo / Hyperglycemic Management (close monitor of Blood Glucose) []  - 0 Ankle / Brachial Index (ABI) - do not check if billed separately X- 1 5 Vital Signs Holly Bennett, Holly H. ([id number]) Has the patient been seen at the hospital within the last three years: Yes Total Score: 75 Level Of Care: New/Established - Level 2 Electronic Signature(s) Signed: 01/12/2018 5:43:37 PM By: Montey Hora Entered By: Montey Hora on 01/12/2018 14:31:14 Holly Bennett ([id number]) -------------------------------------------------------------------------------- Encounter Discharge Information Details Patient Name: Holly Bennett Date of Service: 01/12/2018 1:45 PM Medical Record Number: [id number] Patient Account Number: [id number] Date of Birth/Sex: [birth year] (52 y.o. Female) Treating RN: Cornell Barman Primary Care Caoimhe Damron: Princess Bruins Other Clinician: Referring Holly Bennett: Princess Bruins Treating Angell Pincock/Extender: Cathie Olden in Treatment: 2 Encounter Discharge Information Items Discharge Pain Level: 0 Discharge Condition: Stable Ambulatory Status: Ambulatory Discharge Destination: Home Transportation: Private Auto Accompanied By: self Schedule Follow-up Appointment: Yes Medication Reconciliation completed and No provided to Patient/Care Princesa Willig: Provided on Clinical Summary of Care: 01/12/2018 Form Type Recipient Paper Patient JW Electronic Signature(s) Signed: 01/12/2018 4:42:51 PM By: Montey Hora Entered By: Montey Hora on 01/12/2018 16:42:51 Holly Bennett ([id number]) -------------------------------------------------------------------------------- Lower Extremity Assessment Details Patient Name: Holly Bennett. Date of Service: 01/12/2018 1:45 PM Medical Record Number: [id number] Patient Account Number: [id number] Date of Birth/Sex: 1966-06-25 (52 y.o. Female) Treating RN: Montey Hora Primary Care Mathis Cashman: Princess Bruins Other Clinician: Referring Sawsan Riggio: Princess Bruins Treating Jaxston Chohan/Extender: Cathie Olden in Treatment: 2 Electronic Signature(s) Signed: 01/12/2018 5:43:37 PM By: Montey Hora Entered By: Montey Hora on 01/12/2018 14:16:45 Holly Bennett ([id number]) -------------------------------------------------------------------------------- Multi Wound Chart Details Patient Name: Holly Bennett Date of Service: 01/12/2018 1:45 PM Medical Record Number: [id number] Patient Account Number: [id number] Date of Birth/Sex: 1965/12/25 (52 y.o. Female) Treating RN: Montey Hora Primary Care Zelene Barga: Princess Bruins Other Clinician: Referring Tamla Winkels: Princess Bruins Treating Albeiro Trompeter/Extender: Cathie Olden in Treatment: 2 Vital Signs Height(in): 64 Pulse(bpm): 72 Weight(lbs): 151.6 Blood Pressure(mmHg): 132/83 Body Mass Index(BMI): 26 Temperature(F): 98.1 Respiratory Rate 18 (breaths/min): Photos: [1:No Photos]  [N/A:N/A] Wound Location: [1:Left Breast] [N/A:N/A] Wounding Event: [1:Surgical Injury] [N/A:N/A] Primary Etiology: [1:Dehisced Wound] [N/A:N/A] Comorbid History: [1:Received Radiation] [N/A:N/A] Date Acquired: [1:12/08/2017] [N/A:N/A] Weeks of Treatment: [1:2] [N/A:N/A] Wound Status: [1:Open] [N/A:N/A] Measurements L x W x D [1:0.2x0.8x1.2] [N/A:N/A] (cm) Area (cm) : [1:0.126] [N/A:N/A] Volume (cm) : [1:0.151] [N/A:N/A] % Reduction in Area: [1:82.60%] [N/A:N/A] % Reduction in Volume: [1:82.60%] [N/A:N/A] Classification: [1:Full Thickness Without Exposed Support Structures] [N/A:N/A] Exudate Amount: [1:Large] [N/A:N/A] Exudate Type: [1:Serosanguineous] [N/A:N/A] Exudate Color: [1:red, brown] [N/A:N/A] Foul Odor After Cleansing: [1:Yes] [N/A:N/A] Odor Anticipated Due to [1:No] [N/A:N/A] Product Use: Wound Margin: [1:Distinct, outline  attached] [N/A:N/A] Granulation Amount: [1:Large (67-100%)] [N/A:N/A] Granulation Quality: [1:Red] [N/A:N/A] Necrotic Amount: [1:Small (1-33%)] [N/A:N/A] Necrotic Tissue: [1:Eschar, Adherent Slough] [N/A:N/A] Exposed Structures: [1:Fat Layer (Subcutaneous Tissue) Exposed: Yes Fascia: No Tendon: No Muscle: No Joint: No Bone: No] [N/A:N/A] Epithelialization: [1:None] [N/A:N/A] Periwound Skin Texture: [1:No Abnormalities Noted] [N/A:N/A] Periwound Skin Moisture: [1:Maceration: Yes] [N/A:N/A] Periwound Skin Color: No Abnormalities Noted N/A N/A Temperature: No Abnormality N/A N/A Tenderness on Palpation: Yes N/A N/A Wound Preparation: Ulcer Cleansing: N/A N/A Rinsed/Irrigated with Saline Topical Anesthetic Applied: Other: lidocaine 4% Treatment Notes Electronic Signature(s) Signed: 01/12/2018 2:34:39 PM By: Lawanda Cousins Entered By: Lawanda Cousins on 01/12/2018 14:34:38 Holly Bennett ([id number]) -------------------------------------------------------------------------------- [place] Details Patient Name: Holly Bennett Date of Service: 01/12/2018 1:45 PM Medical Record Number: [id number] Patient Account Number: [id number] Date of Birth/Sex: 12-26-1965 (52 y.o. Female) Treating RN: Montey Hora Primary Care Rochel Privett: Princess Bruins Other Clinician: Referring Mohab Ashby: Princess Bruins Treating Holly Bennett/Extender: Cathie Olden in Treatment: 2 Active Inactive ` Malignancy/Atypical Etiology Nursing Diagnoses: Knowledge deficit related to disease process and management of atypical ulcer etiology Goals: Patient/caregiver will verbalize understanding of disease process and disease management of malignancy Date Initiated: 12/29/2017 Target Resolution Date: 01/28/2018 Goal Status: Active Interventions: Provide education on malignant ulcerations Notes: ` Necrotic Tissue Nursing Diagnoses: Impaired tissue integrity related to necrotic/devitalized tissue Goals: Necrotic/devitalized tissue will be minimized in the wound bed Date Initiated: 12/29/2017 Target Resolution Date: 01/28/2018 Goal Status: Active Interventions: Assess patient pain level pre-, during and post procedure and prior to discharge Treatment Activities: Apply topical anesthetic as ordered : 12/29/2017 Notes: ` Orientation to the Wound Care Program Nursing Diagnoses: Knowledge deficit related to the wound healing center program Goals: Patient/caregiver will verbalize understanding of the [place] Date Initiated: 12/29/2017 Target Resolution Date: 01/28/2018 Holly Bennett, Holly Bennett ([id number]) Goal Status: Active Interventions: Provide education on orientation to the wound center Notes: Electronic Signature(s) Signed: 01/12/2018 5:43:37 PM By: Montey Hora Entered By: Montey Hora on 01/12/2018 14:27:33 Holly Bennett ([id number]) -------------------------------------------------------------------------------- Pain Assessment Details Patient Name: Holly Bennett Date of Service: 01/12/2018 1:45 PM Medical  Record Number: [id number] Patient Account Number: [id number] Date of Birth/Sex: 1966/01/10 (52 y.o. Female) Treating RN: Montey Hora Primary Care Lavora Brisbon: Princess Bruins Other Clinician: Referring Deuntae Kocsis: Princess Bruins Treating Analei Whinery/Extender: Cathie Olden in Treatment: 2 Active Problems Location of Pain Severity and Description of Pain Patient Has Paino No Site Locations Pain Management and Medication Current Pain Management: Electronic Signature(s) Signed: 01/12/2018 5:43:37 PM By: Montey Hora Entered By: Montey Hora on 01/12/2018 14:09:14 Holly Bennett ([id number]) -------------------------------------------------------------------------------- Patient/Caregiver Education Details Patient Name: Holly Bennett Date of Service: 01/12/2018 1:45 PM Medical Record Number: [id number] Patient Account Number: [id number] Date of Birth/Gender: [birth year] (52 y.o. Female) Treating RN: Montey Hora Primary Care Physician: Princess Bruins Other Clinician: Referring Physician: Princess Bruins Treating Physician/Extender: Cathie Olden in Treatment: 2 Education Assessment Education Provided To: Patient Education Topics Provided Wound/Skin Impairment: Handouts: Other: wound care as ordered Methods: Demonstration, Explain/Verbal Responses: State content correctly Electronic Signature(s) Signed: 01/12/2018 5:43:37 PM By: Montey Hora Entered By: Montey Hora on 01/12/2018 16:43:13 Holly Bennett ([id number]) -------------------------------------------------------------------------------- Wound Assessment Details Patient Name: Holly Bennett. Date of Service: 01/12/2018 1:45 PM Medical Record Number: [id number] Patient Account Number: [id number] Date of Birth/Sex: 31-Aug-1966 (52 y.o. Female) Treating RN: Montey Hora Primary Care Finis Hendricksen: Princess Bruins Other Clinician: Referring Dewel Lotter: Princess Bruins Treating Holly Bennett/Extender:  Cathie Olden in Treatment: 2 Wound Status Wound Number: 1 Primary  Etiology: Dehisced Wound Wound Location: Left Breast Wound Status: Open Wounding Event: Surgical Injury Comorbid History: Received Radiation Date Acquired: 12/08/2017 Weeks Of Treatment: 2 Clustered Wound: No Photos Photo Uploaded By: Montey Hora on 01/12/2018 16:25:35 Wound Measurements Length: (cm) 0.2 Width: (cm) 0.8 Depth: (cm) 1.2 Area: (cm) 0.126 Volume: (cm) 0.151 % Reduction in Area: 82.6% % Reduction in Volume: 82.6% Epithelialization: None Tunneling: No Undermining: No Wound Description Full Thickness Without Exposed Support Classification: Structures Wound Margin: Distinct, outline attached Exudate Large Amount: Exudate Type: Serosanguineous Exudate Color: red, brown Foul Odor After Cleansing: Yes Due to Product Use: No Slough/Fibrino Yes Wound Bed Granulation Amount: Large (67-100%) Exposed Structure Granulation Quality: Red Fascia Exposed: No Necrotic Amount: Small (1-33%) Fat Layer (Subcutaneous Tissue) Exposed: Yes Necrotic Quality: Eschar, Adherent Slough Tendon Exposed: No Muscle Exposed: No Joint Exposed: No Bone Exposed: No Kizer, Dinorah H. ([id number]) Periwound Skin Texture Texture Color No Abnormalities Noted: No No Abnormalities Noted: No Moisture Temperature / Pain No Abnormalities Noted: No Temperature: No Abnormality Maceration: Yes Tenderness on Palpation: Yes Wound Preparation Ulcer Cleansing: Rinsed/Irrigated with Saline Topical Anesthetic Applied: Other: lidocaine 4%, Treatment Notes Wound #1 (Left Breast) 1. Cleansed with: Clean wound with Normal Saline 2. Anesthetic Topical Lidocaine 4% cream to wound bed prior to debridement 4. Dressing Applied: Other dressing (specify in notes) 5. Secondary Dressing Applied Bordered Foam Dressing Notes [employer] Electronic Signature(s) Signed: 01/12/2018 5:43:37 PM By: Montey Hora Entered By:  Montey Hora on 01/12/2018 14:15:33 Holly Bennett ([id number]) -------------------------------------------------------------------------------- [place] Details Patient Name: Holly Bennett Date of Service: 01/12/2018 1:45 PM Medical Record Number: [id number] Patient Account Number: [id number] Date of Birth/Sex: 1966-03-25 (52 y.o. Female) Treating RN: Montey Hora Primary Care Jaken Fregia: Princess Bruins Other Clinician: Referring Anokhi Shannon: Princess Bruins Treating Shantana Christon/Extender: Cathie Olden in Treatment: 2 Vital Signs Time Taken: 14:09 Temperature (F): 98.1 Height (in): 64 Pulse (bpm): 72 Weight (lbs): 151.6 Respiratory Rate (breaths/min): 18 Body Mass Index (BMI): 26 Blood Pressure (mmHg): 132/83 Reference Range: 80 - 120 mg / dl Electronic Signature(s) Signed: 01/12/2018 5:43:37 PM By: Montey Hora Entered By: Montey Hora on 01/12/2018 14:09:37

## 2018-01-16 NOTE — Progress Notes (Signed)
Holly Bennett ([id number]) Visit Report for 01/12/2018 Chief Complaint Document Details Patient Name: Holly Bennett, Holly Bennett. Date of Service: 01/12/2018 1:45 PM Medical Record Number: [id number] Patient Account Number: [id number] Date of Birth/Sex: 05-08-1966 (52 y.o. F) Treating RN: Cornell Barman Primary Care Provider: Princess Bruins Other Clinician: Referring Provider: Princess Bruins Treating Provider/Extender: Cathie Olden in Treatment: 2 Information Obtained from: Patient Chief Complaint She is here for evaluation of a left breast wound Electronic Signature(s) Signed: 01/12/2018 2:35:01 PM By: Lawanda Cousins Entered By: Lawanda Cousins on 01/12/2018 14:35:01 Holly Bennett ([id number]) -------------------------------------------------------------------------------- HPI Details Patient Name: Holly Bennett Date of Service: 01/12/2018 1:45 PM Medical Record Number: [id number] Patient Account Number: [id number] Date of Birth/Sex: 08/03/1966 (52 y.o. F) Treating RN: Cornell Barman Primary Care Provider: Princess Bruins Other Clinician: Referring Provider: Princess Bruins Treating Provider/Extender: Cathie Olden in Treatment: 2 History of Present Illness HPI Description: CLINICAL DATA: History of LEFT lumpectomy 11/17/2017. Patient had spontaneous opening of the lumpectomy cavity and continues to have fluid from the lumpectomy scar. She is on her 2nd round of antibiotics. LEFT breast remains painful. o EXAM: ULTRASOUND OF THE LEFT BREAST o COMPARISON: 11/17/2017 o FINDINGS: On physical exam, there is a healing wound in the 12 o'clock location of the LEFT breast. Patient is tender on exam. o Targeted ultrasound is performed, showing a hypoechoic collection deep to the scar in the 12:30 o'clock location of the LEFT breast 1 centimeter from the nipple. This collection shows mobile internal debris on real-time evaluation and measures 3.6 x 1.0 x  2.3 centimeters. o IMPRESSION: Seroma versus abscess in the UPPER portion of the LEFT breast. o RECOMMENDATION: Ultrasound-guided aspiration of LEFT fluid collection. o Continued antibiotic treatment. o I have discussed the findings and recommendations with the patient. Results were also provided in writing at the conclusion of the visit. If applicable, a reminder letter will be sent to the patient regarding the next appointment. o BI-RADS CATEGORY 2: Benign. o o Electronically Signed By: Nolon Nations M.D. On: 12/16/2017 10:31 [facility] 12/20/2017 o HPI: Patient is s/p left breast wire localized lumpectomy and sentinel node biopsy for left breast cancer on 1/23. Her postop course [name], [name] ([id number]) was complicated by wound dehiscense with eschar formation, likely due to reaction to the blue dye during node biopsy. She was started on Silvadene last week by Dr. Adonis Huguenin and she also has completed the abx course started the week prior. She reports the wound continues to heal well and she's still having some serosanguinous drainage, though also some blue dye remnant staining on the gauze. Denies any fevers, chills. o Vital signs: Wt 68.9 kg (152 lb)  BMI 26.09 kg/mo o Physical Exam: Constitutional: No acute distress Breast: Left breast incision healing better, with eschar now off, with some fibrinous material at the inferior portion of the wound. This was sharply excised at bedside. The other side of the wound has healthy granulation tissue. No evidence of infection. There is still induration of the skin surrounding the nipple/areola complex, but improved compared to how it was on the last visit with me. o Assessment/Plan: 52 yo female s/p left breast wire-localized lumpectomy and sentinel node biopsy o --sharply excised fibrinous material over inferior portion of wound. Rest of wound healing well. There is still a small cavity but healing well  overall. --continue dry gauze dressing changes daily and as needed. --continue silvadene ointment --will follow up next week with me. o o [name], MD [facility] ADMISSION 12/29/17; relevant information above including her breast ultrasound in last note from her surgeon. 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 She was discovered to have a malignant neoplasm in the central portion of her left breast by routine mammography. She underwent a left breast lumpectomy with axillary node biopsy on 11/17/17; apparently the margins of this were clean. Some postoperative discomfort and redness which she really didn't think was out of the ordinary however 2 weeks later she is able to show Holly Bennett a picture of a small area in the surgical incision with a necrotic-looking wound bed.the wound started draining. the wound progressed and dehisced. She had an ultrasound of the left breast on 2/21 and fluid was aspirated that apparently cultured Klebsiella oxytocin. She was given antibiotics although at this point I am not quite sure which antibiotics were prescribed. In any case the wound is had progressive opening. She had some debridement by her surgeon. She has been using Silvadene cream to the wound area. She is not having unbearable pain however she finds it difficult to be able to work. Surgery felt that this might be a reaction to the "blue dye" used for lymph node biopsy. I told the patient I have no experience with this. In any case she is been left with a fairly sizable opening in the left breast currently with a depth of 1.2 cm from 3 to 9:00 3 cm of undermining. She has not been systemically unwell 01/05/18; culture of the purulent drainage I did last week largely from the lateral part of the open wound grew a few methicillin sensitive staph aureus. She is on Septra DS 1 by mouth twice a day for 10 days. She states she is already  noted improvement. We packed the wound with silver alginate rope. The wound is contracted nicely and there is certainly less drainage although still 0.8 cm in depth. 01/12/18-she is here in follow-up evaluation for left breast wound. There is improvement in measurement in appearance. She has not yet completed her antibiotic therapy. She is voicing no complaint or concerns, denies fever. We will continue with same treatment plan and follow-up next week Electronic Signature(s) Signed: 01/12/2018 2:36:51 PM By: Lawanda Cousins Entered By: Lawanda Cousins on 01/12/2018 14:36:51 Holly Bennett ([id number]) -------------------------------------------------------------------------------- Physician Orders Details Patient Name: Holly Bennett Date of Service: 01/12/2018 1:45 PM Medical Record Number: [id number] Patient Account Number: [id number] Date of Birth/Sex: December 12, 1965 (52 y.o. F) Treating RN: Montey Hora Primary Care Provider: Princess Bruins Other Clinician: Referring Provider: Princess Bruins Treating Provider/Extender: Cathie Olden in Treatment: 2 Verbal / Phone Orders: No Diagnosis Coding Wound Cleansing Wound #1 Left Breast o Cleanse wound with mild soap and water Anesthetic (add to Medication List) Wound #1 Left Breast o Topical Lidocaine 4% cream applied to wound bed prior to debridement (In Clinic Only). Primary Wound Dressing Wound #1 Left Breast o Silvercel Non-Adherent Secondary Dressing Wound #1 Left Breast o Boardered Foam Dressing Dressing Change Frequency Wound #1 Left Breast o Change dressing every day. Follow-up Appointments Wound #1 Left Breast o Return Appointment in 1 week. Electronic Signature(s) Signed: 01/12/2018 5:31:19 PM By: Lawanda Cousins Signed: 01/12/2018 5:43:37 PM By: Montey Hora Entered By: Montey Hora on 01/12/2018 14:30:46 Holly Bennett  ([id number]) -------------------------------------------------------------------------------- Problem List Details Patient Name: Holly Bennett Date of Service: 01/12/2018 1:45 PM Medical Record Number: [id number] Patient Account Number: [id number] Date of Birth/Sex: Apr 17, 1966 (52 y.o. F) Treating RN: Cornell Barman Primary  Care Provider: Princess Bruins Other Clinician: Referring Provider: Princess Bruins Treating Provider/Extender: Cathie Olden in Treatment: 2 Active Problems ICD-10 Impacting Encounter Code Description Active Date Wound Healing Diagnosis T81.31XD Disruption of external operation (surgical) wound, not 12/29/2017 Yes elsewhere classified, subsequent encounter C50.112 Malignant neoplasm of central portion of left female breast 12/29/2017 Yes S21.002D Unspecified open wound of left breast, subsequent encounter 12/29/2017 Yes Inactive Problems Resolved Problems Electronic Signature(s) Signed: 01/12/2018 2:34:34 PM By: Lawanda Cousins Entered By: Lawanda Cousins on 01/12/2018 14:34:33 Holly Bennett ([id number]) -------------------------------------------------------------------------------- Progress Note Details Patient Name: Holly Bennett Date of Service: 01/12/2018 1:45 PM Medical Record Number: [id number] Patient Account Number: [id number] Date of Birth/Sex: 09-07-1966 (52 y.o. F) Treating RN: Cornell Barman Primary Care Provider: Princess Bruins Other Clinician: Referring Provider: Princess Bruins Treating Provider/Extender: Cathie Olden in Treatment: 2 Subjective Chief Complaint Information obtained from Patient She is here for evaluation of a left breast wound History of Present Illness (HPI) CLINICAL DATA: History of LEFT lumpectomy 11/17/2017. Patient had spontaneous opening of the lumpectomy cavity and continues to have fluid from the lumpectomy scar. She is on her 2nd round of antibiotics. LEFT breast remains painful. EXAM: ULTRASOUND OF THE  LEFT BREAST COMPARISON: 11/17/2017 FINDINGS: On physical exam, there is a healing wound in the 12 o'clock location of the LEFT breast. Patient is tender on exam. Targeted ultrasound is performed, showing a hypoechoic collection deep to the scar in the 12:30 o'clock location of the LEFT breast 1 centimeter from the nipple. This collection shows mobile internal debris on real-time evaluation and measures 3.6 x 1.0 x 2.3 centimeters. IMPRESSION: Seroma versus abscess in the UPPER portion of the LEFT breast. RECOMMENDATION: Ultrasound-guided aspiration of LEFT fluid collection. Continued antibiotic treatment. I have discussed the findings and recommendations with the patient. Results were also provided in writing at the conclusion of the visit. If applicable, a reminder letter will be sent to the patient regarding the next appointment. BI-RADS CATEGORY 2: Benign. Electronically Signed By: Nolon Nations M.D. On: 12/16/2017 10:31 ORI, KREITER ([id number]) [facility] 12/20/2017 HPI: Patient is s/p left breast wire localized lumpectomy and sentinel node biopsy for left breast cancer on 1/23. Her postop course was complicated by wound dehiscense with eschar formation, likely due to reaction to the blue dye during node biopsy. She was started on Silvadene last week by Dr. Adonis Huguenin and she also has completed the abx course started the week prior. She reports the wound continues to heal well and she's still having some serosanguinous drainage, though also some blue dye remnant staining on the gauze. Denies any fevers, chills. Vital signs: Wt 68.9 kg (152 lb)  BMI 26.09 kg/m Physical Exam: Constitutional: No acute distress Breast: Left breast incision healing better, with eschar now off, with some fibrinous material at the inferior portion of the wound. This was sharply excised at bedside. The other side of the wound has healthy granulation tissue. No evidence of infection. There  is still induration of the skin surrounding the nipple/areola complex, but improved compared to how it was on the last visit with me. Assessment/Plan: 52 yo female s/p left breast wire-localized lumpectomy and sentinel node biopsy --sharply excised fibrinous material over inferior portion of wound. Rest of wound healing well. There is still a small cavity but healing well overall. --continue dry gauze dressing changes daily and as needed. --continue silvadene ointment --will follow up next week with me. [name], [facility] ADMISSION 12/29/17; relevant information above including  her breast ultrasound in last note from her surgeon. 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 She was discovered to have a malignant neoplasm in the central portion of her left breast by routine mammography. She underwent a left breast lumpectomy with axillary node biopsy on 11/17/17; apparently the margins of this were clean. Some postoperative discomfort and redness which she really didn't think was out of the ordinary however 2 weeks later she is able to show Holly Bennett a picture of a small area in the surgical incision with a necrotic-looking wound bed.the wound started draining. the wound progressed and dehisced. She had an ultrasound of the left breast on 2/21 and fluid was aspirated that apparently cultured Klebsiella oxytocin. She was given antibiotics although at this point I am not quite sure which antibiotics were prescribed. In any case the wound is had progressive opening. She had some debridement by her surgeon. She has been using Silvadene cream to the wound area. She is not having unbearable pain however she finds it difficult to be able to work. Surgery felt that this might be a reaction to the "blue dye" used for lymph node biopsy. I told the patient I have no experience with this. In any case she is been left with a fairly sizable opening  in the left breast currently with a depth of 1.2 cm from 3 to 9:00 3 cm of undermining. She has not been systemically unwell 01/05/18; culture of the purulent drainage I did last week largely from the lateral part of the open wound grew a few methicillin sensitive staph aureus. She is on Septra DS 1 by mouth twice a day for 10 days. She states she is already noted improvement. We packed the wound with silver alginate rope. The wound is contracted nicely and there is certainly less drainage although still 0.8 cm in depth. 01/12/18-she is here in follow-up evaluation for left breast wound. There is improvement in measurement in appearance. She has not yet completed her antibiotic therapy. She is voicing no complaint or concerns, denies fever. We will continue with same treatment plan and follow-up next week Patient History KALEEYAH, CUFFIE ([id number]) Information obtained from Patient. Family History Cancer - Mother,Father, Hypertension - Father, Lung Disease - Father, Stroke - Father, No family history of Diabetes, Heart Disease, Hereditary Spherocytosis, Kidney Disease, Seizures, Thyroid Problems, Tuberculosis. Social History Never smoker, Marital Status - Married, Alcohol Use - Moderate - once a week, Drug Use - No History, Caffeine Use - Daily. Objective Constitutional Vitals Time Taken: 2:09 PM, Height: 64 in, Weight: 151.6 lbs, BMI: 26, Temperature: 98.1 F, Pulse: 72 bpm, Respiratory Rate: 18 breaths/min, Blood Pressure: 132/83 mmHg. Integumentary (Hair, Skin) Wound #1 status is Open. Original cause of wound was Surgical Injury. The wound is located on the Left Breast. The wound measures 0.2cm length x 0.8cm width x 1.2cm depth; 0.126cm^2 area and 0.151cm^3 volume. There is Fat Layer (Subcutaneous Tissue) Exposed exposed. There is no tunneling or undermining noted. There is a large amount of serosanguineous drainage noted. Foul odor after cleansing was noted. The wound margin is distinct  with the outline attached to the wound base. There is large (67-100%) red granulation within the wound bed. There is a small (1-33%) amount of necrotic tissue within the wound bed including Eschar and Adherent Slough. The periwound skin appearance exhibited: Maceration. Periwound temperature was noted as No Abnormality. The periwound has tenderness on palpation. Assessment Active Problems ICD-10 T81.31XD - Disruption of  external operation (surgical) wound, not elsewhere classified, subsequent encounter C50.112 - Malignant neoplasm of central portion of left female breast S21.002D - Unspecified open wound of left breast, subsequent encounter Plan Wound Cleansing: Wound #1 Left Breast: Cleanse wound with mild soap and water [place], Jassica H. ([id number]) Anesthetic (add to Medication List): Wound #1 Left Breast: Topical Lidocaine 4% cream applied to wound bed prior to debridement (In Clinic Only). Primary Wound Dressing: Wound #1 Left Breast: Silvercel Non-Adherent Secondary Dressing: Wound #1 Left Breast: Boardered Foam Dressing Dressing Change Frequency: Wound #1 Left Breast: Change dressing every day. Follow-up Appointments: Wound #1 Left Breast: Return Appointment in 1 week. 1. continue with silvercel 2. follow up next week Electronic Signature(s) Signed: 01/12/2018 2:38:15 PM By: Lawanda Cousins Entered By: Lawanda Cousins on 01/12/2018 14:38:15 Holly Bennett ([id number]) -------------------------------------------------------------------------------- ROS/PFSH Details Patient Name: Holly Bennett. Date of Service: 01/12/2018 1:45 PM Medical Record Number: [id number] Patient Account Number: [id number] Date of Birth/Sex: [birth year] (52 y.o. F) Treating RN: Cornell Barman Primary Care Provider: Princess Bruins Other Clinician: Referring Provider: Princess Bruins Treating Provider/Extender: Cathie Olden in Treatment: 2 Information Obtained From Patient Wound History Do  you currently have one or more open woundso Yes How many open wounds do you currently haveo 1 Approximately how long have you had your woundso 3 weeks How have you been treating your wound(s) until nowo silvadene Has your wound(s) ever healed and then re-openedo No Have you had any lab work done in the past montho No Have you tested positive for an antibiotic resistant organism (MRSA, VRE)o No Have you tested positive for osteomyelitis (bone infection)o No Have you had any tests for circulation on your legso No Oncologic Medical History: Positive for: Received Radiation - havent started yet Immunizations Pneumococcal Vaccine: Received Pneumococcal Vaccination: No Implantable Devices Family and Social History Cancer: Yes - Mother,Father; Diabetes: No; Heart Disease: No; Hereditary Spherocytosis: No; Hypertension: Yes - Father; Kidney Disease: No; Lung Disease: Yes - Father; Seizures: No; Stroke: Yes - Father; Thyroid Problems: No; Tuberculosis: No; Never smoker; Marital Status - Married; Alcohol Use: Moderate - once a week; Drug Use: No History; Caffeine Use: Daily; Financial Concerns: No; Food, Clothing or Shelter Needs: No; Support System Lacking: No; Transportation Concerns: No; Advanced Directives: No; Patient does not want information on Advanced Directives; Do not resuscitate: No; Living Will: No; Medical Power of Attorney: No Physician Affirmation I have reviewed and agree with the above information. Electronic Signature(s) Signed: 01/12/2018 5:31:19 PM By: Lawanda Cousins Signed: 01/14/2018 4:43:23 PM By: Gretta Cool, BSN, RN, CWS, Kim RN, BSN Entered By: Lawanda Cousins on 01/12/2018 14:37:54 Holly Bennett ([id number]) -------------------------------------------------------------------------------- [place] Details Patient Name: Holly Bennett Date of Service: 01/12/2018 Medical Record Number: [id number] Patient Account Number: [id number] Date of Birth/Sex: 1966-06-01 (52 y.o.  F) Treating RN: Cornell Barman Primary Care Provider: Princess Bruins Other Clinician: Referring Provider: Princess Bruins Treating Provider/Extender: Cathie Olden in Treatment: 2 Diagnosis Coding ICD-10 Codes Code Description T81.31XD Disruption of external operation (surgical) wound, not elsewhere classified, subsequent encounter C50.112 Malignant neoplasm of central portion of left female breast S21.002D Unspecified open wound of left breast, subsequent encounter Facility Procedures CPT4 Code: [id number] Description: [phone] - WOUND CARE VISIT-LEV 2 EST PT Modifier: Quantity: 1 Physician Procedures CPT4: Description Modifier Quantity Code [id number] - WC PHYS LEVEL 3 - EST PT 1 ICD-10 Diagnosis Description T81.31XD Disruption of external operation (surgical) wound, not elsewhere classified, subsequent encounter S21.002D Unspecified open wound of  left breast, subsequent encounter  Electronic Signature(s) Signed: 01/12/2018 2:38:34 PM By: Lawanda Cousins Entered By: Lawanda Cousins on 01/12/2018 14:38:34

## 2018-01-19 ENCOUNTER — Other Ambulatory Visit
Admission: RE | Admit: 2018-01-19 | Discharge: 2018-01-19 | Disposition: A | Discharge status: Home / Self Care | Payer: 59 | Source: Ambulatory Visit | Attending: Internal Medicine | Admitting: Internal Medicine

## 2018-01-19 ENCOUNTER — Encounter: Payer: 59 | Admitting: Internal Medicine

## 2018-01-19 DIAGNOSIS — B999 Unspecified infectious disease: Secondary | ICD-10-CM | POA: Diagnosis present

## 2018-01-19 DIAGNOSIS — T8131XD Disruption of external operation (surgical) wound, not elsewhere classified, subsequent encounter: Secondary | ICD-10-CM | POA: Diagnosis not present

## 2018-01-19 DIAGNOSIS — T8131XA Disruption of external operation (surgical) wound, not elsewhere classified, initial encounter: Secondary | ICD-10-CM | POA: Diagnosis not present

## 2018-01-21 NOTE — Progress Notes (Signed)
LUANA, TATRO ([id number]) Visit Report for 01/19/2018 HPI Details Patient Name: Holly Bennett, Holly Bennett. Date of Service: 01/19/2018 2:15 PM Medical Record Number: [id number] Patient Account Number: [id number] Date of Birth/Sex: Jul 08, 1966 (52 y.o. F) Treating RN: Cornell Barman Primary Care Provider: Princess Bruins Other Clinician: Referring Provider: Princess Bruins Treating Provider/Extender: Tito Dine in Treatment: 3 History of Present Illness HPI Description: CLINICAL DATA: History of LEFT lumpectomy 11/17/2017. Patient had spontaneous opening of the lumpectomy cavity and continues to have fluid from the lumpectomy scar. She is on her 2nd round of antibiotics. LEFT breast remains painful. o EXAM: ULTRASOUND OF THE LEFT BREAST o COMPARISON: 11/17/2017 o FINDINGS: On physical exam, there is a healing wound in the 12 o'clock location of the LEFT breast. Patient is tender on exam. o Targeted ultrasound is performed, showing a hypoechoic collection deep to the scar in the 12:30 o'clock location of the LEFT breast 1 centimeter from the nipple. This collection shows mobile internal debris on real-time evaluation and measures 3.6 x 1.0 x 2.3 centimeters. o IMPRESSION: Seroma versus abscess in the UPPER portion of the LEFT breast. o RECOMMENDATION: Ultrasound-guided aspiration of LEFT fluid collection. o Continued antibiotic treatment. o I have discussed the findings and recommendations with the patient. Results were also provided in writing at the conclusion of the visit. If applicable, a reminder letter will be sent to the patient regarding the next appointment. o BI-RADS CATEGORY 2: Benign. o o Electronically Signed By: Nolon Nations M.D. On: 12/16/2017 10:31 [place] [facility] 12/20/2017 SKARLETH, DELMONICO ([id number]) o HPI: Patient is s/p left breast wire localized lumpectomy and sentinel node biopsy for left breast cancer on 1/23. Her postop course was  complicated by wound dehiscense with eschar formation, likely due to reaction to the blue dye during node biopsy. She was started on Silvadene last week by Dr. Adonis Huguenin and she also has completed the abx course started the week prior. She reports the wound continues to heal well and she's still having some serosanguinous drainage, though also some blue dye remnant staining on the gauze. Denies any fevers, chills. o Vital signs: Wt 68.9 kg (152 lb)  BMI 26.09 kg/mo o Physical Exam: Constitutional: No acute distress Breast: Left breast incision healing better, with eschar now off, with some fibrinous material at the inferior portion of the wound. This was sharply excised at bedside. The other side of the wound has healthy granulation tissue. No evidence of infection. There is still induration of the skin surrounding the nipple/areola complex, but improved compared to how it was on the last visit with me. o Assessment/Plan: 52 yo female s/p left breast wire-localized lumpectomy and sentinel node biopsy o --sharply excised fibrinous material over inferior portion of wound. Rest of wound healing well. There is still a small cavity but healing well overall. --continue dry gauze dressing changes daily and as needed. --continue silvadene ointment --will follow up next week with me. o o [name], [place] 12/29/17; relevant information above including her breast ultrasound in last note from her surgeon. 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 She was discovered to have a malignant neoplasm in the central portion of her left breast by routine mammography. She underwent a left breast lumpectomy with axillary node biopsy on 11/17/17; apparently the margins of this were clean. Some postoperative discomfort and redness which she really didn't think was out of the ordinary however 2 weeks later she is able to  show [place]  a picture of a small area in the surgical incision with a necrotic-looking wound bed.the wound started draining. the wound progressed and dehisced. She had an ultrasound of the left breast on 2/21 and fluid was aspirated that apparently cultured Klebsiella oxytocin. She was given antibiotics although at this point I am not quite sure which antibiotics were prescribed. In any case the wound is had progressive opening. She had some debridement by her surgeon. She has been using Silvadene cream to the wound area. She is not having unbearable pain however she finds it difficult to be able to work. Surgery felt that this might be a reaction to the "blue dye" used for lymph node biopsy. I told the patient I have no experience with this. In any case she is been left with a fairly sizable opening in the left breast currently with a depth of 1.2 cm from 3 to 9:00 3 cm of undermining. She has not been systemically unwell 01/05/18; culture of the purulent drainage I did last week largely from the lateral part of the open wound grew a few methicillin sensitive staph aureus. She is on Septra DS 1 by mouth twice a day for 10 days. She states she is already noted improvement. We packed the wound with silver alginate rope. The wound is contracted nicely and there is certainly less drainage although still 0.8 cm in depth. 01/12/18-she is here in follow-up evaluation for left breast wound. There is improvement in measurement in appearance. She has not yet completed her antibiotic therapy. She is voicing no complaint or concerns, denies fever. We will continue with same treatment plan and follow-up next week 01/19/18; she is here for evaluation of her left breast surgical wound. There is improvement in the appearance of the overall area and the orifice of the wound is certainly contracted. She has completed her original antibiotic therapy. She has no pain or fever. It is becoming difficult to pack the silver alginate  into the wound because the orifice is small and there is still probing depth going laterally DIKSHA, TAGLIAFERRO ([id number]) Electronic Signature(s) Signed: 01/20/2018 8:15:41 AM By: Linton Ham MD Entered By: Linton Ham on 01/20/2018 07:53:23 [name] ([id number]) -------------------------------------------------------------------------------- Physical Exam Details Patient Name: [name]. Date of Service: 01/19/2018 2:15 PM Medical Record Number: [id number] Patient Account Number: [id number] Date of Birth/Sex: 1966/09/12 (52 y.o. F) Treating RN: Cornell Barman Primary Care Provider: Princess Bruins Other Clinician: Referring Provider: Princess Bruins Treating Provider/Extender: Tito Dine in Treatment: 3 Constitutional Sitting or standing Blood Pressure is within target range for patient.. Pulse regular and within target range for patient.Marland Kitchen Respirations regular, non-labored and within target range.. Temperature is normal and within the target range for the patient.Marland Kitchen appears in no distress. Respiratory Respiratory effort is easy and symmetric bilaterally. Rate is normal at rest and on room air.. Chest there is no overt drainage from the left breast orifice. there is a firm area just to the lateral part of the left nipple. Palpation here does not result in pain however there is serosanguineous drainage still.. Lymphatic nonpalpable clavicular or axillary areas. Integumentary (Hair, Skin) no obvious skin issuessurrounding the left breast. Notes wound exam; left breast surgical wound in the mid part of the left breast just above the nipple. She still has some depth and it extends laterally. There is a firm area just lateral to the nipple palpation here resulted in a mild amount of serosanguineous drainage from the wound. There is  however no tenderness no redness. Nevertheless I have gone ahead and cultured this area Electronic Signature(s) Signed:  01/20/2018 8:15:41 AM By: Linton Ham MD Entered By: Linton Ham on 01/20/2018 07:55:53 [name] ([id number]) -------------------------------------------------------------------------------- Physician Orders Details Patient Name: [name] Date of Service: 01/19/2018 2:15 PM Medical Record Number: [id number] Patient Account Number: [id number] Date of Birth/Sex: April 10, 1966 (52 y.o. F) Treating RN: Cornell Barman Primary Care Provider: Princess Bruins Other Clinician: Referring Provider: Princess Bruins Treating Provider/Extender: Tito Dine in Treatment: 3 Verbal / Phone Orders: No Diagnosis Coding Wound Cleansing Wound #1 Left Breast o Cleanse wound with mild soap and water Anesthetic (add to Medication List) Wound #1 Left Breast o Topical Lidocaine 4% cream applied to wound bed prior to debridement (In Clinic Only). Primary Wound Dressing Wound #1 Left Breast o Alginate o Iodoform packing Gauze Secondary Dressing Wound #1 Left Breast o Boardered Foam Dressing Dressing Change Frequency Wound #1 Left Breast o Change dressing every day. Follow-up Appointments Wound #1 Left Breast o Return Appointment in 1 week. Laboratory o Bacteria identified in Wound by Culture (MICRO) - Left Breast oooo LOINC Code: 4332-9 oooo Convenience Name: Wound culture routine Electronic Signature(s) Signed: 01/19/2018 5:06:18 PM By: Gretta Cool, BSN, RN, CWS, Kim RN, BSN Signed: 01/20/2018 8:15:41 AM By: Linton Ham MD Entered By: Gretta Cool, BSN, RN, CWS, Kim on 01/19/2018 15:03:02 [name] ([id number]) -------------------------------------------------------------------------------- Problem List Details Patient Name: WANONA, STARE. Date of Service: 01/19/2018 2:15 PM Medical Record Number: [id number] Patient Account Number: [id number] Date of Birth/Sex: December 31, 1965 (52 y.o. F) Treating RN: Cornell Barman Primary Care Provider: Princess Bruins Other  Clinician: Referring Provider: Princess Bruins Treating Provider/Extender: Tito Dine in Treatment: 3 Active Problems ICD-10 Impacting Encounter Code Description Active Date Wound Healing Diagnosis T81.31XD Disruption of external operation (surgical) wound, not 12/29/2017 Yes elsewhere classified, subsequent encounter C50.112 Malignant neoplasm of central portion of left female breast 12/29/2017 Yes S21.002D Unspecified open wound of left breast, subsequent encounter 12/29/2017 Yes Inactive Problems Resolved Problems Electronic Signature(s) Signed: 01/20/2018 8:15:41 AM By: Linton Ham MD Entered By: Linton Ham on 01/20/2018 07:52:03 [name] ([id number]) -------------------------------------------------------------------------------- Progress Note Details Patient Name: [name] Date of Service: 01/19/2018 2:15 PM Medical Record Number: [id number] Patient Account Number: [id number] Date of Birth/Sex: 1966/07/30 (52 y.o. F) Treating RN: Cornell Barman Primary Care Provider: Princess Bruins Other Clinician: Referring Provider: Princess Bruins Treating Provider/Extender: Tito Dine in Treatment: 3 Subjective History of Present Illness (HPI) CLINICAL DATA: History of LEFT lumpectomy 11/17/2017. Patient had spontaneous opening of the lumpectomy cavity and continues to have fluid from the lumpectomy scar. She is on her 2nd round of antibiotics. LEFT breast remains painful. EXAM: ULTRASOUND OF THE LEFT BREAST COMPARISON: 11/17/2017 FINDINGS: On physical exam, there is a healing wound in the 12 o'clock location of the LEFT breast. Patient is tender on exam. Targeted ultrasound is performed, showing a hypoechoic collection deep to the scar in the 12:30 o'clock location of the LEFT breast 1 centimeter from the nipple. This collection shows mobile internal debris on real-time evaluation and measures 3.6 x 1.0 x  2.3 centimeters. IMPRESSION: Seroma versus abscess in the UPPER portion of the LEFT breast. RECOMMENDATION: Ultrasound-guided aspiration of LEFT fluid collection. Continued antibiotic treatment. I have discussed the findings and recommendations with the patient. Results were also provided in writing at the conclusion of the visit. If applicable, a reminder letter will be sent to the patient regarding the next appointment. BI-RADS  CATEGORY 2: Benign. Electronically Signed By: Nolon Nations M.D. On: 12/16/2017 10:31 [place] [facility] 12/20/2017 HPI: BREYON, BLASS ([id number]) Patient is s/p left breast wire localized lumpectomy and sentinel node biopsy for left breast cancer on 1/23. Her postop course was complicated by wound dehiscense with eschar formation, likely due to reaction to the blue dye during node biopsy. She was started on Silvadene last week by Dr. Adonis Huguenin and she also has completed the abx course started the week prior. She reports the wound continues to heal well and she's still having some serosanguinous drainage, though also some blue dye remnant staining on the gauze. Denies any fevers, chills. Vital signs: Wt 68.9 kg (152 lb)  BMI 26.09 kg/m Physical Exam: Constitutional: No acute distress Breast: Left breast incision healing better, with eschar now off, with some fibrinous material at the inferior portion of the wound. This was sharply excised at bedside. The other side of the wound has healthy granulation tissue. No evidence of infection. There is still induration of the skin surrounding the nipple/areola complex, but improved compared to how it was on the last visit with me. Assessment/Plan: 52 yo female s/p left breast wire-localized lumpectomy and sentinel node biopsy --sharply excised fibrinous material over inferior portion of wound. Rest of wound healing well. There is still a small cavity but healing well overall. --continue dry gauze dressing  changes daily and as needed. --continue silvadene ointment --will follow up next week with me. [name], [place] 12/29/17; relevant information above including her breast ultrasound in last note from her surgeon. 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 She was discovered to have a malignant neoplasm in the central portion of her left breast by routine mammography. She underwent a left breast lumpectomy with axillary node biopsy on 11/17/17; apparently the margins of this were clean. Some postoperative discomfort and redness which she really didn't think was out of the ordinary however 2 weeks later she is able to show [place] a picture of a small area in the surgical incision with a necrotic-looking wound bed.the wound started draining. the wound progressed and dehisced. She had an ultrasound of the left breast on 2/21 and fluid was aspirated that apparently cultured Klebsiella oxytocin. She was given antibiotics although at this point I am not quite sure which antibiotics were prescribed. In any case the wound is had progressive opening. She had some debridement by her surgeon. She has been using Silvadene cream to the wound area. She is not having unbearable pain however she finds it difficult to be able to work. Surgery felt that this might be a reaction to the "blue dye" used for lymph node biopsy. I told the patient I have no experience with this. In any case she is been left with a fairly sizable opening in the left breast currently with a depth of 1.2 cm from 3 to 9:00 3 cm of undermining. She has not been systemically unwell 01/05/18; culture of the purulent drainage I did last week largely from the lateral part of the open wound grew a few methicillin sensitive staph aureus. She is on Septra DS 1 by mouth twice a day for 10 days. She states she is already noted improvement. We packed the wound with silver  alginate rope. The wound is contracted nicely and there is certainly less drainage although still 0.8 cm in depth. 01/12/18-she is here in follow-up evaluation for left breast wound. There is improvement in  measurement in appearance. She has not yet completed her antibiotic therapy. She is voicing no complaint or concerns, denies fever. We will continue with same treatment plan and follow-up next week 01/19/18; she is here for evaluation of her left breast surgical wound. There is improvement in the appearance of the overall area and the orifice of the wound is certainly contracted. She has completed her original antibiotic therapy. She has no pain or fever. It is becoming difficult to pack the silver alginate into the wound because the orifice is small and there is still probing depth going laterally Ballentine, Siarra H. ([id number]) Objective Constitutional Sitting or standing Blood Pressure is within target range for patient.. Pulse regular and within target range for patient.Marland Kitchen Respirations regular, non-labored and within target range.. Temperature is normal and within the target range for the patient.Marland Kitchen appears in no distress. Vitals Time Taken: 2:23 PM, Height: 64 in, Weight: 151.6 lbs, BMI: 26, Temperature: 98.2 F, Pulse: 68 bpm, Respiratory Rate: 18 breaths/min, Blood Pressure: 126/68 mmHg. Respiratory Respiratory effort is easy and symmetric bilaterally. Rate is normal at rest and on room air.. Chest there is no overt drainage from the left breast orifice. there is a firm area just to the lateral part of the left nipple. Palpation here does not result in pain however there is serosanguineous drainage still.. Lymphatic nonpalpable clavicular or axillary areas. General Notes: wound exam; left breast surgical wound in the mid part of the left breast just above the nipple. She still has some depth and it extends laterally. There is a firm area just lateral to the nipple palpation here resulted  in a mild amount of serosanguineous drainage from the wound. There is however no tenderness no redness. Nevertheless I have gone ahead and cultured this area Integumentary (Hair, Skin) no obvious skin issuessurrounding the left breast. Wound #1 status is Open. Original cause of wound was Surgical Injury. The wound is located on the Left Breast. The wound measures 0.2cm length x 0.6cm width x 1.9cm depth; 0.094cm^2 area and 0.179cm^3 volume. There is Fat Layer (Subcutaneous Tissue) Exposed exposed. There is no tunneling noted, however, there is undermining starting at 4:00 and ending at 4:00 with a maximum distance of 2.4cm. There is a large amount of serosanguineous drainage noted. Foul odor after cleansing was noted. The wound margin is distinct with the outline attached to the wound base. There is large (67- 100%) red granulation within the wound bed. There is a small (1-33%) amount of necrotic tissue within the wound bed including Adherent Slough. The periwound skin appearance did not exhibit: Maceration. Periwound temperature was noted as No Abnormality. The periwound has tenderness on palpation. Assessment Active Problems ICD-10 T81.31XD - Disruption of external operation (surgical) wound, not elsewhere classified, subsequent encounter C50.112 - Malignant neoplasm of central portion of left female breast S21.002D - Unspecified open wound of left breast, subsequent encounter POLA, FURNO ([id number]) Plan Wound Cleansing: Wound #1 Left Breast: Cleanse wound with mild soap and water Anesthetic (add to Medication List): Wound #1 Left Breast: Topical Lidocaine 4% cream applied to wound bed prior to debridement (In Clinic Only). Primary Wound Dressing: Wound #1 Left Breast: Alginate Iodoform packing Gauze Secondary Dressing: Wound #1 Left Breast: Boardered Foam Dressing Dressing Change Frequency: Wound #1 Left Breast: Change dressing every day. Follow-up Appointments: Wound #1  Left Breast: Return Appointment in 1 week. Laboratory ordered were: Wound culture routine - Left Breast #1 we change the primary packing to iodoform hopefully this will allow easier  packing #2 I don't want this wound closed at this point there is still a virtual space #3 I have done another culture of this area. She is off the antibiotics I prescribed earlier for MSSA #4 she is certainly not ready for radiation therapy unfortunately. The patient is already aware of this Electronic Signature(s) Signed: 01/20/2018 8:15:41 AM By: Linton Ham MD Entered By: Linton Ham on 01/20/2018 07:57:10 [name] ([id number]) -------------------------------------------------------------------------------- SuperBill Details Patient Name: [name] Date of Service: 01/19/2018 Medical Record Number: [id number] Patient Account Number: [id number] Date of Birth/Sex: [birth year] (52 y.o. F) Treating RN: Cornell Barman Primary Care Provider: Princess Bruins Other Clinician: Referring Provider: Princess Bruins Treating Provider/Extender: Tito Dine in Treatment: 3 Diagnosis Coding ICD-10 Codes Code Description T81.31XD Disruption of external operation (surgical) wound, not elsewhere classified, subsequent encounter C50.112 Malignant neoplasm of central portion of left female breast S21.002D Unspecified open wound of left breast, subsequent encounter Facility Procedures CPT4 Code: [id number] Description: 99213 - WOUND CARE VISIT-LEV 3 EST PT Modifier: Quantity: 1 Physician Procedures CPT4: Description Modifier Quantity Code [id number] 99213 - WC PHYS LEVEL 3 - EST PT 1 ICD-10 Diagnosis Description T81.31XD Disruption of external operation (surgical) wound, not elsewhere classified, subsequent encounter C50.112 Malignant neoplasm of  central portion of left female breast Electronic Signature(s) Signed: 01/20/2018 8:53:20 AM By: Gretta Cool, BSN, RN, CWS, Kim RN, BSN Previous Signature:  01/20/2018 8:15:41 AM Version By: Linton Ham MD Entered By: Gretta Cool, BSN, RN, CWS, Kim on 01/20/2018 08:53:20

## 2018-01-21 NOTE — Progress Notes (Signed)
MORIYA, MITCHELL ([id number]) Visit Report for 01/19/2018 Arrival Information Details Patient Name: Holly Bennett, Holly Bennett. Date of Service: 01/19/2018 2:15 PM Medical Record Number: [id number] Patient Account Number: [id number] Date of Birth/Sex: 10/21/1966 (52 y.o. F) Treating RN: Ahmed Prima Primary Care Oluwadara Gorman: Princess Bruins Other Clinician: Referring Harlon Kutner: Princess Bruins Treating Jazaria Jarecki/Extender: Tito Dine in Treatment: 3 Visit Information History Since Last Visit All ordered tests and consults were completed: No Patient Arrived: Ambulatory Added or deleted any medications: No Arrival Time: 14:22 Any new allergies or adverse reactions: No Accompanied By: self Had a fall or experienced change in No Transfer Assistance: None activities of daily living that may affect Patient Identification Verified: Yes risk of falls: Secondary Verification Process Completed: Yes Signs or symptoms of abuse/neglect since last visito No Patient Requires Transmission-Based No Hospitalized since last visit: No Precautions: Implantable device outside of the clinic excluding No Patient Has Alerts: No cellular tissue based products placed in the center since last visit: Has Dressing in Place as Prescribed: Yes Pain Present Now: No Electronic Signature(s) Signed: 01/19/2018 4:37:47 PM By: Alric Quan Entered By: Alric Quan on 01/19/2018 14:22:59 Holly Bennett ([id number]) -------------------------------------------------------------------------------- Clinic Level of Care Assessment Details Patient Name: Holly Bennett Date of Service: 01/19/2018 2:15 PM Medical Record Number: [id number] Patient Account Number: [id number] Date of Birth/Sex: 1966-06-07 (52 y.o. F) Treating RN: Cornell Barman Primary Care Zoraya Fiorenza: Princess Bruins Other Clinician: Referring Adyline Huberty: Princess Bruins Treating Chelsea Nusz/Extender: Tito Dine in Treatment: 3 Clinic  Level of Care Assessment Items TOOL 4 Quantity Score []  - Use when only an EandM is performed on FOLLOW-UP visit 0 ASSESSMENTS - Nursing Assessment / Reassessment []  - Reassessment of Co-morbidities (includes updates in patient status) 0 X- 1 5 Reassessment of Adherence to Treatment Plan ASSESSMENTS - Wound and Skin Assessment / Reassessment X - Simple Wound Assessment / Reassessment - one wound 1 5 []  - 0 Complex Wound Assessment / Reassessment - multiple wounds []  - 0 Dermatologic / Skin Assessment (not related to wound area) ASSESSMENTS - Focused Assessment []  - Circumferential Edema Measurements - multi extremities 0 []  - 0 Nutritional Assessment / Counseling / Intervention []  - 0 Lower Extremity Assessment (monofilament, tuning fork, pulses) []  - 0 Peripheral Arterial Disease Assessment (using hand held doppler) ASSESSMENTS - Ostomy and/or Continence Assessment and Care []  - Incontinence Assessment and Management 0 []  - 0 Ostomy Care Assessment and Management (repouching, etc.) PROCESS - Coordination of Care X - Simple Patient / Family Education for ongoing care 1 15 []  - 0 Complex (extensive) Patient / Family Education for ongoing care []  - 0 Staff obtains Programmer, systems, Records, Test Results / Process Orders []  - 0 Staff telephones HHA, Nursing Homes / Clarify orders / etc []  - 0 Routine Transfer to another Facility (non-emergent condition) []  - 0 Routine Hospital Admission (non-emergent condition) []  - 0 New Admissions / Biomedical engineer / Ordering NPWT, Apligraf, etc. []  - 0 Emergency Hospital Admission (emergent condition) X- 1 10 Simple Discharge Coordination Holly Bennett, AGNES. ([id number]) []  - 0 Complex (extensive) Discharge Coordination PROCESS - Special Needs []  - Pediatric / Minor Patient Management 0 []  - 0 Isolation Patient Management []  - 0 Hearing / Language / Visual special needs []  - 0 Assessment of Community assistance (transportation, D/C  planning, etc.) []  - 0 Additional assistance / Altered mentation []  - 0 Support Surface(s) Assessment (bed, cushion, seat, etc.) INTERVENTIONS - Wound Cleansing / Measurement X - Simple Wound Cleansing - one wound  1 5 []  - 0 Complex Wound Cleansing - multiple wounds X- 1 5 Wound Imaging (photographs - any number of wounds) []  - 0 Wound Tracing (instead of photographs) X- 1 5 Simple Wound Measurement - one wound []  - 0 Complex Wound Measurement - multiple wounds INTERVENTIONS - Wound Dressings []  - Small Wound Dressing one or multiple wounds 0 X- 1 15 Medium Wound Dressing one or multiple wounds []  - 0 Large Wound Dressing one or multiple wounds []  - 0 Application of Medications - topical []  - 0 Application of Medications - injection INTERVENTIONS - Miscellaneous []  - External ear exam 0 X- 1 5 Specimen Collection (cultures, biopsies, blood, body fluids, etc.) X- 1 5 Specimen(s) / Culture(s) sent or taken to Lab for analysis []  - 0 Patient Transfer (multiple staff / Civil Service fast streamer / Similar devices) []  - 0 Simple Staple / Suture removal (25 or less) []  - 0 Complex Staple / Suture removal (26 or more) []  - 0 Hypo / Hyperglycemic Management (close monitor of Blood Glucose) []  - 0 Ankle / Brachial Index (ABI) - do not check if billed separately X- 1 5 Vital Signs Holly Bennett, Holly H. ([id number]) Has the patient been seen at the hospital within the last three years: Yes Total Score: 80 Level Of Care: New/Established - Level 3 Electronic Signature(s) Signed: 01/20/2018 9:10:06 AM By: Gretta Cool, BSN, RN, CWS, Kim RN, BSN Entered By: Gretta Cool, BSN, RN, CWS, Kim on 01/20/2018 08:53:07 Holly Bennett ([id number]) -------------------------------------------------------------------------------- Encounter Discharge Information Details Patient Name: Holly Bennett Date of Service: 01/19/2018 2:15 PM Medical Record Number: [id number] Patient Account Number: [id number] Date of Birth/Sex:  1966-01-30 (52 y.o. F) Treating RN: Cornell Barman Primary Care Niyam Bisping: Princess Bruins Other Clinician: Referring Debar Plate: Princess Bruins Treating Kele Withem/Extender: Tito Dine in Treatment: 3 Encounter Discharge Information Items Discharge Pain Level: 0 Discharge Condition: Stable Ambulatory Status: Ambulatory Discharge Destination: Home Transportation: Private Auto Accompanied By: self Schedule Follow-up Appointment: Yes Medication Reconciliation completed and No provided to Patient/Care Imer Foxworth: Provided on Clinical Summary of Care: 01/19/2018 Form Type Recipient Paper Patient JW Electronic Signature(s) Signed: 01/19/2018 4:10:38 PM By: Roger Shelter Entered By: Roger Shelter on 01/19/2018 15:16:32 Holly Bennett ([id number]) -------------------------------------------------------------------------------- Lower Extremity Assessment Details Patient Name: Holly Bennett. Date of Service: 01/19/2018 2:15 PM Medical Record Number: [id number] Patient Account Number: [id number] Date of Birth/Sex: 04-06-1966 (52 y.o. F) Treating RN: Ahmed Prima Primary Care Tiron Suski: Princess Bruins Other Clinician: Referring Kaylin Schellenberg: Princess Bruins Treating Trevaughn Schear/Extender: Ricard Dillon Weeks in Treatment: 3 Electronic Signature(s) Signed: 01/19/2018 4:37:47 PM By: Alric Quan Entered By: Alric Quan on 01/19/2018 14:32:58 Holly Bennett ([id number]) -------------------------------------------------------------------------------- Multi Wound Chart Details Patient Name: Holly Bennett Date of Service: 01/19/2018 2:15 PM Medical Record Number: [id number] Patient Account Number: [id number] Date of Birth/Sex: [birth year] (52 y.o. F) Treating RN: Cornell Barman Primary Care Aurielle Slingerland: Princess Bruins Other Clinician: Referring Stellan Vick: Princess Bruins Treating Demarkus Remmel/Extender: Tito Dine in Treatment: 3 Vital Signs Height(in):  64 Pulse(bpm): 68 Weight(lbs): 151.6 Blood Pressure(mmHg): 126/68 Body Mass Index(BMI): 26 Temperature(F): 98.2 Respiratory Rate 18 (breaths/min): Photos: [N/A:N/A] Wound Location: Left Breast N/A N/A Wounding Event: Surgical Injury N/A N/A Primary Etiology: Dehisced Wound N/A N/A Comorbid History: Received Radiation N/A N/A Date Acquired: 12/08/2017 N/A N/A Weeks of Treatment: 3 N/A N/A Wound Status: Open N/A N/A Measurements L x W x D 0.2x0.6x1.9 N/A N/A (cm) Area (cm) : 0.094 N/A N/A Volume (cm) : 0.179 N/A N/A % Reduction in Area: 87.00%  N/A N/A % Reduction in Volume: 79.40% N/A N/A Starting Position 1 4 (o'clock): Ending Position 1 4 (o'clock): Maximum Distance 1 (cm): 2.4 Undermining: Yes N/A N/A Classification: Full Thickness Without N/A N/A Exposed Support Structures Exudate Amount: Large N/A N/A Exudate Type: Serosanguineous N/A N/A Exudate Color: red, brown N/A N/A Foul Odor After Cleansing: Yes N/A N/A Odor Anticipated Due to No N/A N/A Product Use: Wound Margin: Distinct, outline attached N/A N/A Granulation Amount: Large (67-100%) N/A N/A Holly Bennett, Holly Bennett ([id number]) Granulation Quality: Red N/A N/A Necrotic Amount: Small (1-33%) N/A N/A Exposed Structures: Fat Layer (Subcutaneous N/A N/A Tissue) Exposed: Yes Fascia: No Tendon: No Muscle: No Joint: No Bone: No Epithelialization: None N/A N/A Periwound Skin Texture: No Abnormalities Noted N/A N/A Periwound Skin Moisture: Maceration: No N/A N/A Periwound Skin Color: No Abnormalities Noted N/A N/A Temperature: No Abnormality N/A N/A Tenderness on Palpation: Yes N/A N/A Wound Preparation: Ulcer Cleansing: N/A N/A Rinsed/Irrigated with Saline Topical Anesthetic Applied: Other: lidocaine 4% Treatment Notes Wound #1 (Left Breast) 1. Cleansed with: Clean wound with Normal Saline 2. Anesthetic Topical Lidocaine 4% cream to wound bed prior to debridement 4. Dressing Applied: Iodoform packing  Gauze 5. Secondary Dressing Applied Bordered Foam Dressing Electronic Signature(s) Signed: 01/20/2018 8:15:41 AM By: Linton Ham MD Previous Signature: 01/19/2018 5:06:18 PM Version By: Gretta Cool, BSN, RN, CWS, Kim RN, BSN Entered By: Linton Ham on 01/20/2018 07:52:17 Holly Bennett ([id number]) -------------------------------------------------------------------------------- Multi-Disciplinary Care Plan Details Patient Name: Holly Bennett Date of Service: 01/19/2018 2:15 PM Medical Record Number: [id number] Patient Account Number: [id number] Date of Birth/Sex: 1965/11/06 (52 y.o. F) Treating RN: Cornell Barman Primary Care Jemmie Ledgerwood: Princess Bruins Other Clinician: Referring Tlaloc Taddei: Princess Bruins Treating Signa Cheek/Extender: Tito Dine in Treatment: 3 Active Inactive ` Malignancy/Atypical Etiology Nursing Diagnoses: Knowledge deficit related to disease process and management of atypical ulcer etiology Goals: Patient/caregiver will verbalize understanding of disease process and disease management of malignancy Date Initiated: 12/29/2017 Target Resolution Date: 01/28/2018 Goal Status: Active Interventions: Provide education on malignant ulcerations Notes: ` Necrotic Tissue Nursing Diagnoses: Impaired tissue integrity related to necrotic/devitalized tissue Goals: Necrotic/devitalized tissue will be minimized in the wound bed Date Initiated: 12/29/2017 Target Resolution Date: 01/28/2018 Goal Status: Active Interventions: Assess patient pain level pre-, during and post procedure and prior to discharge Treatment Activities: Apply topical anesthetic as ordered : 12/29/2017 Notes: ` Orientation to the Wound Care Program Nursing Diagnoses: Knowledge deficit related to the wound healing center program Goals: Patient/caregiver will verbalize understanding of the [place] Date Initiated: 12/29/2017 Target Resolution Date: 01/28/2018 Holly Bennett, Holly Bennett  ([id number]) Goal Status: Active Interventions: Provide education on orientation to the wound center Notes: Electronic Signature(s) Signed: 01/19/2018 5:06:18 PM By: Gretta Cool, BSN, RN, CWS, Kim RN, BSN Entered By: Gretta Cool, BSN, RN, CWS, Kim on 01/19/2018 14:57:26 Holly Bennett ([id number]) -------------------------------------------------------------------------------- Pain Assessment Details Patient Name: Holly Bennett Date of Service: 01/19/2018 2:15 PM Medical Record Number: [id number] Patient Account Number: [id number] Date of Birth/Sex: [birth year] (52 y.o. F) Treating RN: Ahmed Prima Primary Care Justen Fonda: Princess Bruins Other Clinician: Referring Lynsie Mcwatters: Princess Bruins Treating Yamari Ventola/Extender: Tito Dine in Treatment: 3 Active Problems Location of Pain Severity and Description of Pain Patient Has Paino No Site Locations Pain Management and Medication Current Pain Management: Electronic Signature(s) Signed: 01/19/2018 4:37:47 PM By: Alric Quan Entered By: Alric Quan on 01/19/2018 14:23:06 Holly Bennett ([id number]) -------------------------------------------------------------------------------- Patient/Caregiver Education Details Patient Name: Holly Bennett Date of Service: 01/19/2018 2:15 PM Medical  Record Number: [id number] Patient Account Number: [id number] Date of Birth/Gender: Oct 29, 1965 (52 y.o. F) Treating RN: Roger Shelter Primary Care Physician: Princess Bruins Other Clinician: Referring Physician: Princess Bruins Treating Physician/Extender: Tito Dine in Treatment: 3 Education Assessment Education Provided To: Patient Education Topics Provided Wound/Skin Impairment: Handouts: Caring for Your Ulcer Methods: Explain/Verbal Responses: State content correctly Electronic Signature(s) Signed: 01/19/2018 4:10:38 PM By: Roger Shelter Entered By: Roger Shelter on 01/19/2018 15:16:49 Holly Bennett  ([id number]) -------------------------------------------------------------------------------- Wound Assessment Details Patient Name: Holly Bennett. Date of Service: 01/19/2018 2:15 PM Medical Record Number: [id number] Patient Account Number: [id number] Date of Birth/Sex: October 19, 1966 (52 y.o. F) Treating RN: Ahmed Prima Primary Care Cyndee Giammarco: Princess Bruins Other Clinician: Referring Derrisha Foos: Princess Bruins Treating Omarie Parcell/Extender: Ricard Dillon Weeks in Treatment: 3 Wound Status Wound Number: 1 Primary Etiology: Dehisced Wound Wound Location: Left Breast Wound Status: Open Wounding Event: Surgical Injury Comorbid History: Received Radiation Date Acquired: 12/08/2017 Weeks Of Treatment: 3 Clustered Wound: No Photos Photo Uploaded By: Alric Quan on 01/19/2018 16:18:23 Wound Measurements Length: (cm) 0.2 % Re Width: (cm) 0.6 % Re Depth: (cm) 1.9 Epit Area: (cm) 0.094 Tun Volume: (cm) 0.179 Und S E M duction in Area: 87% duction in Volume: 79.4% helialization: None neling: No ermining: Yes tarting Position (o'clock): 4 nding Position (o'clock): 4 aximum Distance: (cm) 2.4 Wound Description Full Thickness Without Exposed Support Foul Classification: Structures Due Wound Margin: Distinct, outline attached Slou Exudate Large Amount: Exudate Type: Serosanguineous Exudate Color: red, brown Odor After Cleansing: Yes to Product Use: No gh/Fibrino Yes Wound Bed Granulation Amount: Large (67-100%) Exposed Structure Granulation Quality: Red Fascia Exposed: No Necrotic Amount: Small (1-33%) Fat Layer (Subcutaneous Tissue) Exposed: Yes Holly Bennett, Holly Bennett ([id number]) Necrotic Quality: Adherent Slough Tendon Exposed: No Muscle Exposed: No Joint Exposed: No Bone Exposed: No Periwound Skin Texture Texture Color No Abnormalities Noted: No No Abnormalities Noted: No Moisture Temperature / Pain No Abnormalities Noted: No Temperature: No  Abnormality Maceration: No Tenderness on Palpation: Yes Wound Preparation Ulcer Cleansing: Rinsed/Irrigated with Saline Topical Anesthetic Applied: Other: lidocaine 4%, Treatment Notes Wound #1 (Left Breast) 1. Cleansed with: Clean wound with Normal Saline 2. Anesthetic Topical Lidocaine 4% cream to wound bed prior to debridement 4. Dressing Applied: Iodoform packing Gauze 5. Secondary Dressing Applied Bordered Foam Dressing Electronic Signature(s) Signed: 01/19/2018 4:37:47 PM By: Alric Quan Signed: 01/19/2018 5:06:18 PM By: Gretta Cool, BSN, RN, CWS, Kim RN, BSN Entered By: Gretta Cool, BSN, RN, CWS, Kim on 01/19/2018 14:59:20 Holly Bennett ([id number]) -------------------------------------------------------------------------------- Vitals Details Patient Name: Holly Bennett Date of Service: 01/19/2018 2:15 PM Medical Record Number: [id number] Patient Account Number: [id number] Date of Birth/Sex: 04-20-1966 (52 y.o. F) Treating RN: Ahmed Prima Primary Care Manuel Dall: Princess Bruins Other Clinician: Referring Jaren Kearn: Princess Bruins Treating Ailea Rhatigan/Extender: Tito Dine in Treatment: 3 Vital Signs Time Taken: 14:23 Temperature (F): 98.2 Height (in): 64 Pulse (bpm): 68 Weight (lbs): 151.6 Respiratory Rate (breaths/min): 18 Body Mass Index (BMI): 26 Blood Pressure (mmHg): 126/68 Reference Range: 80 - 120 mg / dl Electronic Signature(s) Signed: 01/19/2018 4:37:47 PM By: Alric Quan Entered By: Alric Quan on 01/19/2018 14:24:44

## 2018-01-22 LAB — AEROBIC CULTURE  (SUPERFICIAL SPECIMEN): CULTURE: NORMAL

## 2018-01-25 ENCOUNTER — Telehealth: Payer: Self-pay

## 2018-01-25 NOTE — Telephone Encounter (Signed)
Left message for patient to return call to office.  Just have a few questions in regards to her follow up care for wound as well as Radiation treatments, and or  General surgeon follow up.

## 2018-01-26 ENCOUNTER — Encounter: Payer: 59 | Attending: Internal Medicine | Admitting: Internal Medicine

## 2018-01-26 DIAGNOSIS — Y838 Other surgical procedures as the cause of abnormal reaction of the patient, or of later complication, without mention of misadventure at the time of the procedure: Secondary | ICD-10-CM | POA: Insufficient documentation

## 2018-01-26 DIAGNOSIS — T8131XA Disruption of external operation (surgical) wound, not elsewhere classified, initial encounter: Secondary | ICD-10-CM | POA: Insufficient documentation

## 2018-01-26 DIAGNOSIS — Z853 Personal history of malignant neoplasm of breast: Secondary | ICD-10-CM | POA: Insufficient documentation

## 2018-01-30 NOTE — Progress Notes (Signed)
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  INTERVAL HISTORY: Patient returns to clinic for further evaluation and follow-up for her open breast wound.  She has been seen in the wound center multiple times with significant improvement and now has only a small open area of less than 1 cm.  She underwent her lumpectomy on November 17, 2017.  She has no neurologic complaints.  She denies any recent fevers or illnesses.  She has a good appetite and denies weight loss.  She denies any pain.  She has no chest pain or shortness of breath.  She denies any nausea, vomiting, constipation, or diarrhea.  She has no urinary complaints.  Patient offers no further specific complaints today.  REVIEW OF SYSTEMS:   Review of Systems  Constitutional: Negative.  Negative for fever, malaise/fatigue and weight loss.  Respiratory: Negative.  Negative for cough and shortness of breath.   Cardiovascular: Negative.  Negative for chest pain and leg swelling.  Gastrointestinal: Negative.  Negative for abdominal pain.  Genitourinary: Negative.   Musculoskeletal: Negative.   Skin: Negative.  Negative for rash.  Neurological: Negative.  Negative for sensory change, focal weakness and weakness.  Psychiatric/Behavioral: Negative.  The patient is not nervous/anxious.     As per HPI. Otherwise, a complete review of systems is negative.  PAST MEDICAL HISTORY: Past Medical History:  Diagnosis Date  . Acute right flank pain 01/21/2011  . Back pain 01/21/2011  . Cancer of central portion of left female breast ([place]) 10/23/2017   also skin cancer on calf(5 yrs ago) and cheek (august 2018)  . Heart murmur    found  once many years ago  . 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 History of kidney stones    passed twice  . Personal history of other malignant neoplasm of skin 06/10/2017  . Viral warts, unspecified     PAST SURGICAL HISTORY: Past Surgical History:  Procedure Laterality Date  . APPENDECTOMY  1983  . AXILLARY SENTINEL NODE BIOPSY Left 11/17/2017   Procedure: AXILLARY SENTINEL NODE BIOPSY;  Surgeon: Olean Ree, MD;  Location: [facility] ORS;  Service: General;  Laterality: Left;  . BREAST LUMPECTOMY WITH NEEDLE LOCALIZATION Left 11/17/2017   Procedure: BREAST LUMPECTOMY WITH NEEDLE LOCALIZATION;  Surgeon: Olean Ree, MD;  Location: [facility] ORS;  Service: General;  Laterality: Left;  . DILATION AND CURETTAGE OF UTERUS     x4  . MOHS SURGERY  2018  . WISDOM TOOTH EXTRACTION      FAMILY HISTORY: Family History  Problem Relation Age of Onset  . Breast cancer Mother 56       currently 23  . Hypertension Father   . Lung cancer Father 96       smoker; deceased 45  . Breast cancer Other        mat grandmother's sister    ADVANCED DIRECTIVES (Y/N):  N  HEALTH MAINTENANCE: Social History   Tobacco Use  . Smoking status: Never Smoker  . Smokeless tobacco: Never Used  Substance Use Topics  . Alcohol use: Yes    Comment: Socially on weekends  . Drug use: No     Colonoscopy:  PAP:  Bone density:  Lipid panel:  Allergies  Allergen Reactions  . Blue Dyes (Parenteral) Dermatitis  . Penicillins Rash and Other (See Comments)    Has patient had a PCN reaction causing immediate rash, facial/tongue/throat swelling, SOB or lightheadedness with hypotension: Unknown Has patient had a PCN reaction causing severe rash involving mucus membranes or skin necrosis: No Has patient had a PCN reaction that required hospitalization: No Has patient had a PCN reaction occurring within the last 10 years: No If all of the above answers are "NO", then may proceed with Cephalosporin  use.     Current Outpatient Medications  Medication Sig Dispense Refill  . Ascorbic Acid (VITAMIN C) 500 MG tablet Take 500 mg by mouth daily.      . Calcium Carbonate-Vitamin D (CALCIUM 600+D) 600-400 MG-UNIT per tablet Take 1 tablet by mouth daily.      . Glucosamine 500 MG CAPS Take 500 mg by mouth daily.     . Multiple Vitamin (MULTIVITAMIN) tablet Take 1 tablet by mouth daily.      . vitamin B-12 (CYANOCOBALAMIN) 100 MCG tablet Take 100 mcg by mouth daily.     No current facility-administered medications for this visit.     OBJECTIVE: Vitals:   01/31/18 1439 01/31/18 1444  BP:  131/80  Pulse:  80  Resp: 12   Temp:  97.8 F (36.6 C)     Body mass index is 26.71 kg/m.    ECOG FS:0 - Asymptomatic  General: Well-developed, well-nourished, no acute distress. Eyes: Pink conjunctiva, anicteric sclera. Breast: Left breast with continued open wound, approximately 5 mm.  No erythema. Lungs: Clear to auscultation bilaterally. Heart: Regular rate and rhythm. No rubs, murmurs, or gallops. Abdomen: Soft, nontender, nondistended. No organomegaly noted, normoactive bowel sounds. Musculoskeletal: No edema, cyanosis, or clubbing. Neuro: Alert, answering all questions appropriately. Cranial nerves grossly intact. Skin: No rashes or petechiae noted. Psych: Normal affect.    LAB RESULTS:  No results found for: NA, K, CL, CO2, GLUCOSE, BUN, CREATININE, CALCIUM, PROT, ALBUMIN, AST, ALT, ALKPHOS, BILITOT, GFRNONAA, GFRAA  No results found for: WBC, NEUTROABS, HGB, HCT, MCV, PLT   STUDIES: No results found.  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  Oncotype DX score 16, which is considered low risk.   PLAN:    1. 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  She had  difficulty with wound healing possibly secondary to her methylene blue injection for sentinel node biopsy and has been seen in the wound care center multiple times.  This area seems to be nearly healed of approximately 5 mm left open.  She has a re-consultation with radiation oncology on February 17, 2018 for further evaluation and consideration of adjuvant XRT.  Because patient is ER/PR positive, she will also require an aromatase inhibitor for 5 years.  Return to clinic mid-June near the end of her XRT for further evaluation.   2.  Genetic testing: Given patient's age and family history of breast cancer in her mother at approximately the age of 78, have referred patient for genetic testing.  Results are pending at time of dictation.  Approximately 20 minutes was spent in discussion of which greater than 50% was consultation.   Patient expressed understanding and was in agreement with this plan. She also understands that She can call clinic  at any time with any questions, concerns, or complaints.   Cancer Staging Malignant neoplasm of left breast in female, estrogen receptor positive ([place]) Staging form: Breast, AJCC 8th Edition - Clinical stage from 10/23/2017: Stage IA (cT1a, cN0, cM0, G2, ER: Positive, PR: Positive, HER2: Negative) - Signed by Lloyd Huger, MD on 12/09/2017   Lloyd Huger, MD   02/01/2018 2:31 PM

## 2018-01-31 ENCOUNTER — Encounter: Payer: Self-pay | Admitting: Oncology

## 2018-01-31 ENCOUNTER — Inpatient Hospital Stay: Payer: 59 | Attending: Oncology | Admitting: Oncology

## 2018-01-31 ENCOUNTER — Other Ambulatory Visit: Payer: Self-pay

## 2018-01-31 VITALS — BP 131/80 | HR 80 | Temp 97.8°F | Resp 12 | Ht 64.0 in | Wt 155.6 lb

## 2018-01-31 DIAGNOSIS — Z170 Estrogen receptor positive status [ER+]: Secondary | ICD-10-CM | POA: Diagnosis not present

## 2018-01-31 DIAGNOSIS — C50112 Malignant neoplasm of central portion of left female breast: Secondary | ICD-10-CM | POA: Diagnosis not present

## 2018-01-31 DIAGNOSIS — Z803 Family history of malignant neoplasm of breast: Secondary | ICD-10-CM | POA: Diagnosis not present

## 2018-01-31 DIAGNOSIS — Z85828 Personal history of other malignant neoplasm of skin: Secondary | ICD-10-CM | POA: Insufficient documentation

## 2018-01-31 NOTE — Progress Notes (Signed)
Patient here for follow up. Her breast wound is healed.

## 2018-01-31 NOTE — Progress Notes (Signed)
EZMERALDA, STEFANICK ([id number]) Visit Report for 01/26/2018 HPI Details Patient Name: Holly Bennett, Holly Bennett. Date of Service: 01/26/2018 8:45 AM Medical Record Number: [id number] Patient Account Number: [id number] Date of Birth/Sex: 11-15-1965 (52 y.o. F) Treating RN: Cornell Barman Primary Care Provider: Princess Bruins Other Clinician: Referring Provider: Princess Bruins Treating Provider/Extender: Tito Dine in Treatment: 4 History of Present Illness HPI Description: CLINICAL DATA: History of LEFT lumpectomy 11/17/2017. Patient had spontaneous opening of the lumpectomy cavity and continues to have fluid from the lumpectomy scar. She is on her 2nd round of antibiotics. LEFT breast remains painful. o EXAM: ULTRASOUND OF THE LEFT BREAST o COMPARISON: 11/17/2017 o FINDINGS: On physical exam, there is a healing wound in the 12 o'clock location of the LEFT breast. Patient is tender on exam. o Targeted ultrasound is performed, showing a hypoechoic collection deep to the scar in the 12:30 o'clock location of the LEFT breast 1 centimeter from the nipple. This collection shows mobile internal debris on real-time evaluation and measures 3.6 x 1.0 x 2.3 centimeters. o IMPRESSION: Seroma versus abscess in the UPPER portion of the LEFT breast. o RECOMMENDATION: Ultrasound-guided aspiration of LEFT fluid collection. o Continued antibiotic treatment. o I have discussed the findings and recommendations with the patient. Results were also provided in writing at the conclusion of the visit. If applicable, a reminder letter will be sent to the patient regarding the next appointment. o BI-RADS CATEGORY 2: Benign. o o Electronically Signed By: Nolon Nations M.D. On: 12/16/2017 10:31 [place] [facility] 12/20/2017 Holly, Bennett ([id number]) o HPI: Patient is s/p left breast wire localized lumpectomy and sentinel node biopsy for left breast cancer on 1/23. Her postop course was  complicated by wound dehiscense with eschar formation, likely due to reaction to the blue dye during node biopsy. She was started on Silvadene last week by Dr. Adonis Huguenin and she also has completed the abx course started the week prior. She reports the wound continues to heal well and she's still having some serosanguinous drainage, though also some blue dye remnant staining on the gauze. Denies any fevers, chills. o Vital signs: Wt 68.9 kg (152 lb)  BMI 26.09 kg/mo o Physical Exam: Constitutional: No acute distress Breast: Left breast incision healing better, with eschar now off, with some fibrinous material at the inferior portion of the wound. This was sharply excised at bedside. The other side of the wound has healthy granulation tissue. No evidence of infection. There is still induration of the skin surrounding the nipple/areola complex, but improved compared to how it was on the last visit with me. o Assessment/Plan: 52 yo female s/p left breast wire-localized lumpectomy and sentinel node biopsy o --sharply excised fibrinous material over inferior portion of wound. Rest of wound healing well. There is still a small cavity but healing well overall. --continue dry gauze dressing changes daily and as needed. --continue silvadene ointment --will follow up next week with me. o o Holly Bennett, [place] 12/29/17; relevant information above including her breast ultrasound in last note from her surgeon. 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 She was discovered to have a malignant neoplasm in the central portion of her left breast by routine mammography. She underwent a left breast lumpectomy with axillary node biopsy on 11/17/17; apparently the margins of this were clean. Some postoperative discomfort and redness which she really didn't think was out of the ordinary however 2 weeks later she is able to  show [place]  a picture of a small area in the surgical incision with a necrotic-looking wound bed.the wound started draining. the wound progressed and dehisced. She had an ultrasound of the left breast on 2/21 and fluid was aspirated that apparently cultured Klebsiella oxytocin. She was given antibiotics although at this point I am not quite sure which antibiotics were prescribed. In any case the wound is had progressive opening. She had some debridement by her surgeon. She has been using Silvadene cream to the wound area. She is not having unbearable pain however she finds it difficult to be able to work. Surgery felt that this might be a reaction to the "blue dye" used for lymph node biopsy. I told the patient I have no experience with this. In any case she is been left with a fairly sizable opening in the left breast currently with a depth of 1.2 cm from 3 to 9:00 3 cm of undermining. She has not been systemically unwell 01/05/18; culture of the purulent drainage I did last week largely from the lateral part of the open wound grew a few methicillin sensitive staph aureus. She is on Septra DS 1 by mouth twice a day for 10 days. She states she is already noted improvement. We packed the wound with silver alginate rope. The wound is contracted nicely and there is certainly less drainage although still 0.8 cm in depth. 01/12/18-she is here in follow-up evaluation for left breast wound. There is improvement in measurement in appearance. She has not yet completed her antibiotic therapy. She is voicing no complaint or concerns, denies fever. We will continue with same treatment plan and follow-up next week 01/19/18; she is here for evaluation of her left breast surgical wound. There is improvement in the appearance of the overall area and the orifice of the wound is certainly contracted. She has completed her original antibiotic therapy. She has no pain or fever. It is becoming difficult to pack the silver alginate  into the wound because the orifice is small and there is still probing depth going laterally 01/26/18; patient is here for evaluation of her left breast surgical wound/lumpectomy for early-stage carcinoma. An EKG was complicated by an infected seroma and then mastitis. Also been successfully treated. There was still some drainage last week Wickard, Adrienna H. ([id number]) which I cultured however this was negative. His come down in size both the external orifice and the depth of 1.3 cm. I think we can try and let this close now with simply topical dressings and then she can get on with adjunctive radiation Electronic Signature(s) Signed: 01/26/2018 4:49:21 PM By: Linton Ham MD Entered By: Linton Ham on 01/26/2018 09:39:31 [name] ([id number]) -------------------------------------------------------------------------------- Physical Exam Details Patient Name: [name] Date of Service: 01/26/2018 8:45 AM Medical Record Number: [id number] Patient Account Number: [id number] Date of Birth/Sex: 12/07/1965 (52 y.o. F) Treating RN: Cornell Barman Primary Care Provider: Princess Bruins Other Clinician: Referring Provider: Princess Bruins Treating Provider/Extender: Tito Dine in Treatment: 4 Constitutional Sitting or standing Blood Pressure is within target range for patient.. Pulse regular and within target range for patient.Marland Kitchen Respirations regular, non-labored and within target range.. Temperature is normal and within the target range for the patient.Marland Kitchen appears in no distress. Chest there is no firm area on the lateral part of the nipple like last week.. Lymphatic no left axillary adenopathy. Integumentary (Hair, Skin) skin on the breast looks normal. Notes when exam; left breast surgical wound only a small orifice remains  in the mid part of the left breast just above the nipple. 1.3 cm. There does not appear to be as much lateral extension as last week. The firm  area on the lateral part of the nipple also is not felt this week. There is no drainage no tenderness no erythema Electronic Signature(s) Signed: 01/26/2018 4:49:21 PM By: Linton Ham MD Entered By: Linton Ham on 01/26/2018 09:41:10 [name] ([id number]) -------------------------------------------------------------------------------- Physician Orders Details Patient Name: [name] Date of Service: 01/26/2018 8:45 AM Medical Record Number: [id number] Patient Account Number: [id number] Date of Birth/Sex: 18-Jan-1966 (52 y.o. F) Treating RN: Cornell Barman Primary Care Provider: Princess Bruins Other Clinician: Referring Provider: Princess Bruins Treating Provider/Extender: Tito Dine in Treatment: 4 Verbal / Phone Orders: No Diagnosis Coding Wound Cleansing o Cleanse wound with mild soap and water Anesthetic (add to Medication List) o Topical Lidocaine 4% cream applied to wound bed prior to debridement (In Clinic Only). Primary Wound Dressing o Silver Alginate - left breast Secondary Dressing o Boardered Foam Dressing Dressing Change Frequency Wound #1 Left Breast o Three times weekly Follow-up Appointments Wound #1 Left Breast o Return Appointment in 1 week. Electronic Signature(s) Signed: 01/26/2018 4:49:21 PM By: Linton Ham MD Signed: 01/31/2018 9:08:37 AM By: Gretta Cool, BSN, RN, CWS, Kim RN, BSN Entered By: Gretta Cool, BSN, RN, CWS, Kim on 01/26/2018 09:17:38 [name] ([id number]) -------------------------------------------------------------------------------- Problem List Details Patient Name: SHARAI, OVERBAY. Date of Service: 01/26/2018 8:45 AM Medical Record Number: [id number] Patient Account Number: [id number] Date of Birth/Sex: 1966-06-08 (52 y.o. F) Treating RN: Cornell Barman Primary Care Provider: Princess Bruins Other Clinician: Referring Provider: Princess Bruins Treating Provider/Extender: Tito Dine in  Treatment: 4 Active Problems ICD-10 Impacting Encounter Code Description Active Date Wound Healing Diagnosis T81.31XD Disruption of external operation (surgical) wound, not 12/29/2017 Yes elsewhere classified, subsequent encounter C50.112 Malignant neoplasm of central portion of left female breast 12/29/2017 Yes S21.002D Unspecified open wound of left breast, subsequent encounter 12/29/2017 Yes Inactive Problems Resolved Problems Electronic Signature(s) Signed: 01/26/2018 4:49:21 PM By: Linton Ham MD Entered By: Linton Ham on 01/26/2018 09:37:00 [name] ([id number]) -------------------------------------------------------------------------------- Progress Note Details Patient Name: [name] Date of Service: 01/26/2018 8:45 AM Medical Record Number: [id number] Patient Account Number: [id number] Date of Birth/Sex: 11/29/1965 (52 y.o. F) Treating RN: Cornell Barman Primary Care Provider: Princess Bruins Other Clinician: Referring Provider: Princess Bruins Treating Provider/Extender: Tito Dine in Treatment: 4 Subjective History of Present Illness (HPI) CLINICAL DATA: History of LEFT lumpectomy 11/17/2017. Patient had spontaneous opening of the lumpectomy cavity and continues to have fluid from the lumpectomy scar. She is on her 2nd round of antibiotics. LEFT breast remains painful. EXAM: ULTRASOUND OF THE LEFT BREAST COMPARISON: 11/17/2017 FINDINGS: On physical exam, there is a healing wound in the 12 o'clock location of the LEFT breast. Patient is tender on exam. Targeted ultrasound is performed, showing a hypoechoic collection deep to the scar in the 12:30 o'clock location of the LEFT breast 1 centimeter from the nipple. This collection shows mobile internal debris on real-time evaluation and measures 3.6 x 1.0 x 2.3 centimeters. IMPRESSION: Seroma versus abscess in the UPPER portion of the LEFT breast. RECOMMENDATION: Ultrasound-guided  aspiration of LEFT fluid collection. Continued antibiotic treatment. I have discussed the findings and recommendations with the patient. Results were also provided in writing at the conclusion of the visit. If applicable, a reminder letter will be sent to the patient regarding the next appointment. BI-RADS CATEGORY 2:  Benign. Electronically Signed By: Nolon Nations M.D. On: 12/16/2017 10:31 [place] [facility] 12/20/2017 HPI: TASHINA, CREDIT ([id number]) Patient is s/p left breast wire localized lumpectomy and sentinel node biopsy for left breast cancer on 1/23. Her postop course was complicated by wound dehiscense with eschar formation, likely due to reaction to the blue dye during node biopsy. She was started on Silvadene last week by Dr. Adonis Huguenin and she also has completed the abx course started the week prior. She reports the wound continues to heal well and she's still having some serosanguinous drainage, though also some blue dye remnant staining on the gauze. Denies any fevers, chills. Vital signs: Wt 68.9 kg (152 lb)  BMI 26.09 kg/m Physical Exam: Constitutional: No acute distress Breast: Left breast incision healing better, with eschar now off, with some fibrinous material at the inferior portion of the wound. This was sharply excised at bedside. The other side of the wound has healthy granulation tissue. No evidence of infection. There is still induration of the skin surrounding the nipple/areola complex, but improved compared to how it was on the last visit with me. Assessment/Plan: 52 yo female s/p left breast wire-localized lumpectomy and sentinel node biopsy --sharply excised fibrinous material over inferior portion of wound. Rest of wound healing well. There is still a small cavity but healing well overall. --continue dry gauze dressing changes daily and as needed. --continue silvadene ointment --will follow up next week with me. Holly Bennett, [place] 12/29/17; relevant information above including her breast ultrasound in last note from her surgeon. 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 She was discovered to have a malignant neoplasm in the central portion of her left breast by routine mammography. She underwent a left breast lumpectomy with axillary node biopsy on 11/17/17; apparently the margins of this were clean. Some postoperative discomfort and redness which she really didn't think was out of the ordinary however 2 weeks later she is able to show [place] a picture of a small area in the surgical incision with a necrotic-looking wound bed.the wound started draining. the wound progressed and dehisced. She had an ultrasound of the left breast on 2/21 and fluid was aspirated that apparently cultured Klebsiella oxytocin. She was given antibiotics although at this point I am not quite sure which antibiotics were prescribed. In any case the wound is had progressive opening. She had some debridement by her surgeon. She has been using Silvadene cream to the wound area. She is not having unbearable pain however she finds it difficult to be able to work. Surgery felt that this might be a reaction to the "blue dye" used for lymph node biopsy. I told the patient I have no experience with this. In any case she is been left with a fairly sizable opening in the left breast currently with a depth of 1.2 cm from 3 to 9:00 3 cm of undermining. She has not been systemically unwell 01/05/18; culture of the purulent drainage I did last week largely from the lateral part of the open wound grew a few methicillin sensitive staph aureus. She is on Septra DS 1 by mouth twice a day for 10 days. She states she is already noted improvement. We packed the wound with silver alginate rope. The wound is contracted nicely and there is certainly less drainage although still 0.8 cm in depth. 01/12/18-she is here  in follow-up evaluation for left breast wound. There is improvement in measurement in  appearance. She has not yet completed her antibiotic therapy. She is voicing no complaint or concerns, denies fever. We will continue with same treatment plan and follow-up next week 01/19/18; she is here for evaluation of her left breast surgical wound. There is improvement in the appearance of the overall area and the orifice of the wound is certainly contracted. She has completed her original antibiotic therapy. She has no pain or fever. It is becoming difficult to pack the silver alginate into the wound because the orifice is small and there is still probing depth going laterally 01/26/18; patient is here for evaluation of her left breast surgical wound/lumpectomy for early-stage carcinoma. An EKG was complicated by an infected seroma and then mastitis. Also been successfully treated. There was still some drainage last week which I cultured however this was negative. His come down in size both the external orifice and the depth of 1.3 cm. I think we can try and let this close now with simply topical dressings and then she can get on with adjunctive radiation MCKAILA, DUFFUS. ([id number]) Objective Constitutional Sitting or standing Blood Pressure is within target range for patient.. Pulse regular and within target range for patient.Marland Kitchen Respirations regular, non-labored and within target range.. Temperature is normal and within the target range for the patient.Marland Kitchen appears in no distress. Vitals Time Taken: 8:49 AM, Height: 64 in, Weight: 151.6 lbs, BMI: 26, Temperature: 97.8 F, Pulse: 71 bpm, Respiratory Rate: 18 breaths/min, Blood Pressure: 122/89 mmHg. Chest there is no firm area on the lateral part of the nipple like last week.. Lymphatic no left axillary adenopathy. General Notes: when exam; left breast surgical wound only a small orifice remains in the mid part of the left breast just above the nipple. 1.3  cm. There does not appear to be as much lateral extension as last week. The firm area on the lateral part of the nipple also is not felt this week. There is no drainage no tenderness no erythema Integumentary (Hair, Skin) skin on the breast looks normal. Wound #1 status is Open. Original cause of wound was Surgical Injury. The wound is located on the Left Breast. The wound measures 0.2cm length x 0.7cm width x 1.3cm depth; 0.11cm^2 area and 0.143cm^3 volume. There is Fat Layer (Subcutaneous Tissue) Exposed exposed. There is no tunneling or undermining noted. There is a large amount of sanguinous drainage noted. Foul odor after cleansing was noted. The wound margin is distinct with the outline attached to the wound base. There is large (67-100%) red granulation within the wound bed. There is a small (1-33%) amount of necrotic tissue within the wound bed including Adherent Slough. The periwound skin appearance did not exhibit: Maceration. Periwound temperature was noted as No Abnormality. The periwound has tenderness on palpation. Assessment Active Problems ICD-10 T81.31XD - Disruption of external operation (surgical) wound, not elsewhere classified, subsequent encounter C50.112 - Malignant neoplasm of central portion of left female breast S21.002D - Unspecified open wound of left breast, subsequent encounter SONJIA, WILCOXSON. ([id number]) Plan Wound Cleansing: Cleanse wound with mild soap and water Anesthetic (add to Medication List): Topical Lidocaine 4% cream applied to wound bed prior to debridement (In Clinic Only). Primary Wound Dressing: Silver Alginate - left breast Secondary Dressing: Boardered Foam Dressing Dressing Change Frequency: Wound #1 Left Breast: Three times weekly Follow-up Appointments: Wound #1 Left Breast: Return Appointment in 1 week. #1 continue with silver alginate with a foam dressing #2 culture last week was negative, no antibiotics were necessary #3  I think  this area is closing and I am satisfied with the closing at this point. Everything appears to be a lot better. #4 will see her back next week I'm hopeful this area will be closed and we can get on with radiation perhaps in 2 or 3 weeks Electronic Signature(s) Signed: 01/26/2018 4:49:21 PM By: Linton Ham MD Entered By: Linton Ham on 01/26/2018 09:42:16 [name] ([id number]) -------------------------------------------------------------------------------- SuperBill Details Patient Name: [name]. Date of Service: 01/26/2018 Medical Record Number: [id number] Patient Account Number: [id number] Date of Birth/Sex: 1966-05-19 (52 y.o. F) Treating RN: Cornell Barman Primary Care Provider: Princess Bruins Other Clinician: Referring Provider: Princess Bruins Treating Provider/Extender: Tito Dine in Treatment: 4 Diagnosis Coding ICD-10 Codes Code Description T81.31XD Disruption of external operation (surgical) wound, not elsewhere classified, subsequent encounter C50.112 Malignant neoplasm of central portion of left female breast S21.002D Unspecified open wound of left breast, subsequent encounter Facility Procedures CPT4 Code: [id number] Description: [phone] - WOUND CARE VISIT-LEV 2 EST PT Modifier: Quantity: 1 Physician Procedures CPT4: Description Modifier Quantity Code [id number] - WC PHYS LEVEL 3 - EST PT 1 ICD-10 Diagnosis Description T81.31XD Disruption of external operation (surgical) wound, not elsewhere classified, subsequent encounter C50.112 Malignant neoplasm of  central portion of left female breast Electronic Signature(s) Signed: 01/26/2018 4:49:21 PM By: Linton Ham MD Entered By: Linton Ham on 01/26/2018 09:42:42

## 2018-01-31 NOTE — Progress Notes (Signed)
Holly Bennett, Holly Bennett ([id number]) Visit Report for 01/26/2018 Arrival Information Details Patient Name: Holly Bennett, Holly Bennett. Date of Service: 01/26/2018 8:45 AM Medical Record Number: [id number] Patient Account Number: [id number] Date of Birth/Sex: Mar 04, 1966 (52 y.o. F) Treating Holly Bennett: Holly Bennett Primary Care Holly Bennett: Holly Bennett Other Clinician: Referring Holly Bennett: Holly Bennett Treating Soliyana Mcchristian/Extender: Holly Bennett in Treatment: 4 Visit Information History Since Last Visit All ordered tests and consults were completed: No Patient Arrived: Ambulatory Added or deleted any medications: No Arrival Time: 08:49 Any new allergies or adverse reactions: No Accompanied By: self Had a fall or experienced change in No Transfer Assistance: None activities of daily living that may affect Patient Identification Verified: Yes risk of falls: Secondary Verification Process Completed: Yes Signs or symptoms of abuse/neglect since last visito No Patient Requires Transmission-Based No Hospitalized since last visit: No Precautions: Implantable device outside of the clinic excluding No Patient Has Alerts: No cellular tissue based products placed in the center since last visit: Has Dressing in Place as Prescribed: Yes Pain Present Now: No Electronic Signature(s) Signed: 01/27/2018 4:32:30 PM By: Holly Bennett Entered By: Holly Bennett on 01/26/2018 08:49:34 Holly Bennett ([id number]) -------------------------------------------------------------------------------- Clinic Level of Care Assessment Details Patient Name: Holly Bennett Date of Service: 01/26/2018 8:45 AM Medical Record Number: [id number] Patient Account Number: [id number] Date of Birth/Sex: 1965-10-29 (52 y.o. F) Treating Holly Bennett: Holly Bennett Primary Care Kayal Mula: Holly Bennett Other Clinician: Referring Nadie Fiumara: Holly Bennett Treating Judaea Burgoon/Extender: Holly Bennett in Treatment: 4 Clinic Level  of Care Assessment Items TOOL 4 Quantity Score []  - Use when only an EandM is performed on FOLLOW-UP visit 0 ASSESSMENTS - Nursing Assessment / Reassessment []  - Reassessment of Co-morbidities (includes updates in patient status) 0 X- 1 5 Reassessment of Adherence to Treatment Plan ASSESSMENTS - Wound and Skin Assessment / Reassessment X - Simple Wound Assessment / Reassessment - one wound 1 5 []  - 0 Complex Wound Assessment / Reassessment - multiple wounds []  - 0 Dermatologic / Skin Assessment (not related to wound area) ASSESSMENTS - Focused Assessment []  - Circumferential Edema Measurements - multi extremities 0 []  - 0 Nutritional Assessment / Counseling / Intervention []  - 0 Lower Extremity Assessment (monofilament, tuning fork, pulses) []  - 0 Peripheral Arterial Disease Assessment (using hand held doppler) ASSESSMENTS - Ostomy and/or Continence Assessment and Care []  - Incontinence Assessment and Management 0 []  - 0 Ostomy Care Assessment and Management (repouching, etc.) PROCESS - Coordination of Care X - Simple Patient / Family Education for ongoing care 1 15 []  - 0 Complex (extensive) Patient / Family Education for ongoing care []  - 0 Staff obtains Programmer, systems, Records, Test Results / Process Orders []  - 0 Staff telephones HHA, Nursing Homes / Clarify orders / etc []  - 0 Routine Transfer to another Facility (non-emergent condition) []  - 0 Routine Hospital Admission (non-emergent condition) []  - 0 New Admissions / Biomedical engineer / Ordering NPWT, Apligraf, etc. []  - 0 Emergency Hospital Admission (emergent condition) X- 1 10 Simple Discharge Coordination Holly Bennett, Holly Bennett ([id number]) []  - 0 Complex (extensive) Discharge Coordination PROCESS - Special Needs []  - Pediatric / Minor Patient Management 0 []  - 0 Isolation Patient Management []  - 0 Hearing / Language / Visual special needs []  - 0 Assessment of Community assistance (transportation, D/C  planning, etc.) []  - 0 Additional assistance / Altered mentation []  - 0 Support Surface(s) Assessment (bed, cushion, seat, etc.) INTERVENTIONS - Wound Cleansing / Measurement X - Simple Wound Cleansing - one wound  1 5 []  - 0 Complex Wound Cleansing - multiple wounds X- 1 5 Wound Imaging (photographs - any number of wounds) []  - 0 Wound Tracing (instead of photographs) X- 1 5 Simple Wound Measurement - one wound []  - 0 Complex Wound Measurement - multiple wounds INTERVENTIONS - Wound Dressings []  - Small Wound Dressing one or multiple wounds 0 X- 1 15 Medium Wound Dressing one or multiple wounds []  - 0 Large Wound Dressing one or multiple wounds []  - 0 Application of Medications - topical []  - 0 Application of Medications - injection INTERVENTIONS - Miscellaneous []  - External ear exam 0 []  - 0 Specimen Collection (cultures, biopsies, blood, body fluids, etc.) []  - 0 Specimen(s) / Culture(s) sent or taken to Lab for analysis []  - 0 Patient Transfer (multiple staff / Civil Service fast streamer / Similar devices) []  - 0 Simple Staple / Suture removal (25 or less) []  - 0 Complex Staple / Suture removal (26 or more) []  - 0 Hypo / Hyperglycemic Management (close monitor of Blood Glucose) []  - 0 Ankle / Brachial Index (ABI) - do not check if billed separately X- 1 5 Vital Signs Holly Bennett, Holly H. ([id number]) Has the patient been seen at the hospital within the last three years: Yes Total Score: 70 Level Of Care: New/Established - Level 2 Electronic Signature(s) Signed: 01/31/2018 9:08:37 AM By: Holly Bennett, BSN, Holly Bennett, Holly Bennett, Kim Holly Bennett, Holly Bennett Entered By: Holly Bennett, BSN, Holly Bennett, Holly Bennett, Kim on 01/26/2018 09:19:25 Holly Bennett ([id number]) -------------------------------------------------------------------------------- Encounter Discharge Information Details Patient Name: Holly Bennett Date of Service: 01/26/2018 8:45 AM Medical Record Number: [id number] Patient Account Number: [id number] Date of Birth/Sex:  12-26-1965 (52 y.o. F) Treating Holly Bennett: Holly Bennett Primary Care Praise Dolecki: Holly Bennett Other Clinician: Referring Hamid Brookens: Holly Bennett Treating Litzy Dicker/Extender: Holly Bennett in Treatment: 4 Encounter Discharge Information Items Discharge Pain Level: 0 Discharge Condition: Stable Ambulatory Status: Ambulatory Discharge Destination: Home Transportation: Private Auto Accompanied By: self Schedule Follow-up Appointment: Yes Medication Reconciliation completed and No provided to Patient/Care Laketia Vicknair: Provided on Clinical Summary of Care: 01/26/2018 Form Type Recipient Paper Patient JW Electronic Signature(s) Signed: 01/26/2018 4:04:39 PM By: Holly Bennett Previous Signature: 01/26/2018 9:22:37 AM Version By: Holly Bennett Entered By: Holly Bennett on 01/26/2018 09:24:03 Holly Bennett ([id number]) -------------------------------------------------------------------------------- Lower Extremity Assessment Details Patient Name: Holly Bennett. Date of Service: 01/26/2018 8:45 AM Medical Record Number: [id number] Patient Account Number: [id number] Date of Birth/Sex: 05-11-1966 (52 y.o. F) Treating Holly Bennett: Holly Bennett Primary Care Maigan Bittinger: Holly Bennett Other Clinician: Referring Ginevra Tacker: Holly Bennett Treating Shalon Salado/Extender: Holly Bennett in Treatment: 4 Electronic Signature(s) Signed: 01/27/2018 4:32:30 PM By: Holly Bennett Entered By: Holly Bennett on 01/26/2018 08:51:25 Holly Bennett ([id number]) -------------------------------------------------------------------------------- Multi Wound Chart Details Patient Name: Holly Bennett. Date of Service: 01/26/2018 8:45 AM Medical Record Number: [id number] Patient Account Number: [id number] Date of Birth/Sex: 16-Aug-1966 (52 y.o. F) Treating Holly Bennett: Holly Bennett Primary Care Jenesa Foresta: Holly Bennett Other Clinician: Referring Elner Seifert: Holly Bennett Treating Brei Pociask/Extender:  Holly Bennett in Treatment: 4 Vital Signs Height(in): 64 Pulse(bpm): 71 Weight(lbs): 151.6 Blood Pressure(mmHg): 122/89 Body Mass Index(BMI): 26 Temperature(F): 97.8 Respiratory Rate 18 (breaths/min): Photos: [1:No Photos] [N/A:N/A] Wound Location: [1:Left Breast] [N/A:N/A] Wounding Event: [1:Surgical Injury] [N/A:N/A] Primary Etiology: [1:Dehisced Wound] [N/A:N/A] Comorbid History: [1:Received Radiation] [N/A:N/A] Date Acquired: [1:12/08/2017] [N/A:N/A] Bennett of Treatment: [1:4] [N/A:N/A] Wound Status: [1:Open] [N/A:N/A] Measurements L x W x D [1:0.2x0.7x1.3] [N/A:N/A] (cm) Area (cm) : [1:0.11] [N/A:N/A] Volume (cm) : [1:0.143] [N/A:N/A] % Reduction in Area: [  1:84.80%] [N/A:N/A] % Reduction in Volume: [1:83.50%] [N/A:N/A] Classification: [1:Full Thickness Without Exposed Support Structures] [N/A:N/A] Exudate Amount: [1:Large] [N/A:N/A] Exudate Type: [1:Sanguinous] [N/A:N/A] Exudate Color: [1:red] [N/A:N/A] Foul Odor After Cleansing: [1:Yes] [N/A:N/A] Odor Anticipated Due to [1:No] [N/A:N/A] Product Use: Wound Margin: [1:Distinct, outline attached] [N/A:N/A] Granulation Amount: [1:Large (67-100%)] [N/A:N/A] Granulation Quality: [1:Red] [N/A:N/A] Necrotic Amount: [1:Small (1-33%)] [N/A:N/A] Exposed Structures: [1:Fat Layer (Subcutaneous Tissue) Exposed: Yes Fascia: No Tendon: No Muscle: No Joint: No Bone: No] [N/A:N/A] Epithelialization: [1:None] [N/A:N/A] Periwound Skin Texture: [1:No Abnormalities Noted] [N/A:N/A] Periwound Skin Moisture: [1:Maceration: No] [N/A:N/A] Periwound Skin Color: [1:No Abnormalities Noted] [N/A:N/A] Temperature: No Abnormality N/A N/A Tenderness on Palpation: Yes N/A N/A Wound Preparation: Ulcer Cleansing: N/A N/A Rinsed/Irrigated with Saline Topical Anesthetic Applied: Other: lidocaine 4% Treatment Notes Wound #1 (Left Breast) 1. Cleansed with: Clean wound with Normal Saline 2. Anesthetic Topical Lidocaine 4% cream to  wound bed prior to debridement 4. Dressing Applied: Other dressing (specify in notes) 5. Secondary Dressing Applied Bordered Foam Dressing Notes silvercel Electronic Signature(s) Signed: 01/26/2018 4:49:21 PM By: Holly Bennett Entered By: Holly Ham on 01/26/2018 09:37:23 Holly Bennett ([id number]) -------------------------------------------------------------------------------- Multi-Disciplinary Care Plan Details Patient Name: Holly Bennett Date of Service: 01/26/2018 8:45 AM Medical Record Number: [id number] Patient Account Number: [id number] Date of Birth/Sex: 08-23-1966 (52 y.o. F) Treating Holly Bennett: Holly Bennett Primary Care Yahel Fuston: Holly Bennett Other Clinician: Referring Andrea Colglazier: Holly Bennett Treating Tanequa Kretz/Extender: Holly Bennett in Treatment: 4 Active Inactive ` Malignancy/Atypical Etiology Nursing Diagnoses: Knowledge deficit related to disease process and management of atypical ulcer etiology Goals: Patient/caregiver will verbalize understanding of disease process and disease management of malignancy Date Initiated: 12/29/2017 Target Resolution Date: 01/28/2018 Goal Status: Active Interventions: Provide education on malignant ulcerations Notes: ` Necrotic Tissue Nursing Diagnoses: Impaired tissue integrity related to necrotic/devitalized tissue Goals: Necrotic/devitalized tissue will be minimized in the wound bed Date Initiated: 12/29/2017 Target Resolution Date: 01/28/2018 Goal Status: Active Interventions: Assess patient pain level pre-, during and post procedure and prior to discharge Treatment Activities: Apply topical anesthetic as ordered : 12/29/2017 Notes: ` Orientation to the Wound Care Program Nursing Diagnoses: Knowledge deficit related to the wound healing center program Goals: Patient/caregiver will verbalize understanding of the [place] Date Initiated: 12/29/2017 Target Resolution Date:  01/28/2018 KAYLIAH, TINDOL ([id number]) Goal Status: Active Interventions: Provide education on orientation to the wound center Notes: Electronic Signature(s) Signed: 01/31/2018 9:08:37 AM By: Holly Bennett, BSN, Holly Bennett, Holly Bennett, Kim Holly Bennett, Holly Bennett Entered By: Holly Bennett, BSN, Holly Bennett, Holly Bennett, Kim on 01/26/2018 09:13:16 Holly Bennett ([id number]) -------------------------------------------------------------------------------- Pain Assessment Details Patient Name: Holly Bennett Date of Service: 01/26/2018 8:45 AM Medical Record Number: [id number] Patient Account Number: [id number] Date of Birth/Sex: [birth year] (52 y.o. F) Treating Holly Bennett: Holly Bennett Primary Care Markus Casten: Holly Bennett Other Clinician: Referring Marva Hendryx: Holly Bennett Treating Muranda Coye/Extender: Holly Bennett in Treatment: 4 Active Problems Location of Pain Severity and Description of Pain Patient Has Paino No Site Locations Pain Management and Medication Current Pain Management: Electronic Signature(s) Signed: 01/27/2018 4:32:30 PM By: Holly Bennett Entered By: Holly Bennett on 01/26/2018 08:49:39 Holly Bennett ([id number]) -------------------------------------------------------------------------------- Patient/Caregiver Education Details Patient Name: Holly Bennett Date of Service: 01/26/2018 8:45 AM Medical Record Number: [id number] Patient Account Number: [id number] Date of Birth/Gender: Jun 24, 1966 (52 y.o. F) Treating Holly Bennett: Holly Bennett Primary Care Physician: Holly Bennett Other Clinician: Referring Physician: [name] Treating Physician/Extender: Holly Bennett in Treatment: 4 Education Assessment Education Provided To: Patient Education Topics Provided Wound/Skin Impairment: Handouts:  Other: do not pack wound anymore Methods: Demonstration, Explain/Verbal Responses: State content correctly Electronic Signature(s) Signed: 01/26/2018 4:21:37 PM By: Holly Bennett Entered By: Holly Bennett on 01/26/2018 09:23:01 Holly Bennett ([id number]) -------------------------------------------------------------------------------- Wound Assessment Details Patient Name: Holly Bennett. Date of Service: 01/26/2018 8:45 AM Medical Record Number: [id number] Patient Account Number: [id number] Date of Birth/Sex: [birth year] (52 y.o. F) Treating Holly Bennett: Holly Bennett Primary Care Emie Sommerfeld: Holly Bennett Other Clinician: Referring Lauran Romanski: Holly Bennett Treating Sotero Brinkmeyer/Extender: Holly Bennett in Treatment: 4 Wound Status Wound Number: 1 Primary Etiology: Dehisced Wound Wound Location: Left Breast Wound Status: Open Wounding Event: Surgical Injury Comorbid History: Received Radiation Date Acquired: 12/08/2017 Bennett Of Treatment: 4 Clustered Wound: No Photos Photo Uploaded By: Holly Bennett on 01/26/2018 15:29:22 Wound Measurements Length: (cm) 0.2 Width: (cm) 0.7 Depth: (cm) 1.3 Area: (cm) 0.11 Volume: (cm) 0.143 % Reduction in Area: 84.8% % Reduction in Volume: 83.5% Epithelialization: None Tunneling: No Undermining: No Wound Description Full Thickness Without Exposed Support Classification: Structures Wound Margin: Distinct, outline attached Exudate Large Amount: Exudate Type: Sanguinous Exudate Color: red Foul Odor After Cleansing: Yes Due to Product Use: No Slough/Fibrino Yes Wound Bed Granulation Amount: Large (67-100%) Exposed Structure Granulation Quality: Red Fascia Exposed: No Necrotic Amount: Small (1-33%) Fat Layer (Subcutaneous Tissue) Exposed: Yes Necrotic Quality: Adherent Slough Tendon Exposed: No Muscle Exposed: No Joint Exposed: No Bone Exposed: No Gavilanes, [place] H. ([id number]) Periwound Skin Texture Texture Color No Abnormalities Noted: No No Abnormalities Noted: No Moisture Temperature / Pain No Abnormalities Noted: No Temperature: No Abnormality Maceration: No Tenderness on Palpation: Yes Wound  Preparation Ulcer Cleansing: Rinsed/Irrigated with Saline Topical Anesthetic Applied: Other: lidocaine 4%, Treatment Notes Wound #1 (Left Breast) 1. Cleansed with: Clean wound with Normal Saline 2. Anesthetic Topical Lidocaine 4% cream to wound bed prior to debridement 4. Dressing Applied: Other dressing (specify in notes) 5. Secondary Dressing Applied Bordered Foam Dressing Notes silvercel Electronic Signature(s) Signed: 01/27/2018 4:32:30 PM By: Holly Bennett Entered By: Holly Bennett on 01/26/2018 08:58:18 Holly Bennett ([id number]) -------------------------------------------------------------------------------- [place] Details Patient Name: Holly Bennett Date of Service: 01/26/2018 8:45 AM Medical Record Number: [id number] Patient Account Number: [id number] Date of Birth/Sex: [birth year] (52 y.o. F) Treating Holly Bennett: Holly Bennett Primary Care Tiegan Terpstra: Holly Bennett Other Clinician: Referring Rajvi Armentor: Holly Bennett Treating Wilgus Deyton/Extender: Holly Bennett in Treatment: 4 Vital Signs Time Taken: 08:49 Temperature (F): 97.8 Height (in): 64 Pulse (bpm): 71 Weight (lbs): 151.6 Respiratory Rate (breaths/min): 18 Body Mass Index (BMI): 26 Blood Pressure (mmHg): 122/89 Reference Range: 80 - 120 mg / dl Electronic Signature(s) Signed: 01/27/2018 4:32:30 PM By: Holly Bennett Entered By: Holly Bennett on 01/26/2018 08:51:16

## 2018-02-02 ENCOUNTER — Encounter: Payer: 59 | Admitting: Internal Medicine

## 2018-02-02 DIAGNOSIS — T8131XA Disruption of external operation (surgical) wound, not elsewhere classified, initial encounter: Secondary | ICD-10-CM | POA: Diagnosis not present

## 2018-02-04 NOTE — Progress Notes (Signed)
Holly, Bennett ([id number]) Visit Report for 02/02/2018 Arrival Information Details Patient Name: Holly Bennett, Holly Bennett. Date of Service: 02/02/2018 2:00 PM Medical Record Number: [id number] Patient Account Number: [id number] Date of Birth/Sex: [birth year] (52 y.o. F) Treating RN: Cornell Barman Primary Care Meshia Rau: Princess Bruins Other Clinician: Referring Shawntee Mainwaring: Princess Bruins Treating Addysin Porco/Extender: Tito Dine in Treatment: 5 Visit Information History Since Last Visit Added or deleted any medications: No Patient Arrived: Ambulatory Any new allergies or adverse reactions: No Arrival Time: 14:10 Had a fall or experienced change in No Accompanied By: self activities of daily living that may affect Transfer Assistance: None risk of falls: Patient Identification Verified: Yes Signs or symptoms of abuse/neglect since last visito No Secondary Verification Process Completed: Yes Hospitalized since last visit: No Patient Requires Transmission-Based No Implantable device outside of the clinic excluding No Precautions: cellular tissue based products placed in the center Patient Has Alerts: No since last visit: Has Dressing in Place as Prescribed: Yes Pain Present Now: No Electronic Signature(s) Signed: 02/02/2018 4:09:34 PM By: Lorine Bears RCP, RRT, CHT Entered By: Lorine Bears on 02/02/2018 14:11:06 Holly Bennett ([id number]) -------------------------------------------------------------------------------- Clinic Level of Care Assessment Details Patient Name: Holly Bennett. Date of Service: 02/02/2018 2:00 PM Medical Record Number: [id number] Patient Account Number: [id number] Date of Birth/Sex: 1966/01/17 (52 y.o. F) Treating RN: Cornell Barman Primary Care Patriciann Becht: Princess Bruins Other Clinician: Referring Miles Borkowski: Princess Bruins Treating Fenton Candee/Extender: Tito Dine in Treatment: 5 Clinic Level of Care  Assessment Items TOOL 4 Quantity Score []  - Use when only an EandM is performed on FOLLOW-UP visit 0 ASSESSMENTS - Nursing Assessment / Reassessment []  - Reassessment of Co-morbidities (includes updates in patient status) 0 []  - 0 Reassessment of Adherence to Treatment Plan ASSESSMENTS - Wound and Skin Assessment / Reassessment X - Simple Wound Assessment / Reassessment - one wound 1 5 []  - 0 Complex Wound Assessment / Reassessment - multiple wounds []  - 0 Dermatologic / Skin Assessment (not related to wound area) ASSESSMENTS - Focused Assessment []  - Circumferential Edema Measurements - multi extremities 0 []  - 0 Nutritional Assessment / Counseling / Intervention []  - 0 Lower Extremity Assessment (monofilament, tuning fork, pulses) []  - 0 Peripheral Arterial Disease Assessment (using hand held doppler) ASSESSMENTS - Ostomy and/or Continence Assessment and Care []  - Incontinence Assessment and Management 0 []  - 0 Ostomy Care Assessment and Management (repouching, etc.) PROCESS - Coordination of Care X - Simple Patient / Family Education for ongoing care 1 15 []  - 0 Complex (extensive) Patient / Family Education for ongoing care []  - 0 Staff obtains Programmer, systems, Records, Test Results / Process Orders []  - 0 Staff telephones HHA, Nursing Homes / Clarify orders / etc []  - 0 Routine Transfer to another Facility (non-emergent condition) []  - 0 Routine Hospital Admission (non-emergent condition) []  - 0 New Admissions / Biomedical engineer / Ordering NPWT, Apligraf, etc. []  - 0 Emergency Hospital Admission (emergent condition) X- 1 10 Simple Discharge Coordination [name], [name] ([id number]) []  - 0 Complex (extensive) Discharge Coordination PROCESS - Special Needs []  - Pediatric / Minor Patient Management 0 []  - 0 Isolation Patient Management []  - 0 Hearing / Language / Visual special needs []  - 0 Assessment of Community assistance (transportation, D/C planning,  etc.) []  - 0 Additional assistance / Altered mentation []  - 0 Support Surface(s) Assessment (bed, cushion, seat, etc.) INTERVENTIONS - Wound Cleansing / Measurement X - Simple Wound Cleansing - one wound 1 5 []  -  0 Complex Wound Cleansing - multiple wounds X- 1 5 Wound Imaging (photographs - any number of wounds) []  - 0 Wound Tracing (instead of photographs) X- 1 5 Simple Wound Measurement - one wound []  - 0 Complex Wound Measurement - multiple wounds INTERVENTIONS - Wound Dressings []  - Small Wound Dressing one or multiple wounds 0 []  - 0 Medium Wound Dressing one or multiple wounds []  - 0 Large Wound Dressing one or multiple wounds []  - 0 Application of Medications - topical []  - 0 Application of Medications - injection INTERVENTIONS - Miscellaneous []  - External ear exam 0 []  - 0 Specimen Collection (cultures, biopsies, blood, body fluids, etc.) []  - 0 Specimen(s) / Culture(s) sent or taken to Lab for analysis []  - 0 Patient Transfer (multiple staff / Civil Service fast streamer / Similar devices) []  - 0 Simple Staple / Suture removal (25 or less) []  - 0 Complex Staple / Suture removal (26 or more) []  - 0 Hypo / Hyperglycemic Management (close monitor of Blood Glucose) []  - 0 Ankle / Brachial Index (ABI) - do not check if billed separately X- 1 5 Vital Signs [name], [name] ([id number]) Has the patient been seen at the hospital within the last three years: Yes Total Score: 50 Level Of Care: New/Established - Level 2 Electronic Signature(s) Signed: 02/02/2018 2:55:02 PM By: Gretta Cool, BSN, RN, CWS, Kim RN, BSN Entered By: Gretta Cool, BSN, RN, CWS, Kim on 02/02/2018 14:34:08 Holly Bennett ([id number]) -------------------------------------------------------------------------------- Encounter Discharge Information Details Patient Name: Holly Bennett Date of Service: 02/02/2018 2:00 PM Medical Record Number: [id number] Patient Account Number: [id number] Date of Birth/Sex: 1966/04/12 (52  y.o. F) Treating RN: Cornell Barman Primary Care Jeydi Klingel: Princess Bruins Other Clinician: Referring Avelina Mcclurkin: Princess Bruins Treating Sybilla Malhotra/Extender: Tito Dine in Treatment: 5 Encounter Discharge Information Items Discharge Pain Level: 0 Discharge Condition: Stable Ambulatory Status: Ambulatory Discharge Destination: Home Transportation: Private Auto Accompanied By: self Schedule Follow-up Appointment: No Medication Reconciliation completed and No provided to Patient/Care Michel Hendon: Patient Clinical Summary of Care: Declined Electronic Signature(s) Signed: 02/02/2018 2:41:00 PM By: Gretta Cool, BSN, RN, CWS, Kim RN, BSN Previous Signature: 02/02/2018 2:40:51 PM Version By: Gretta Cool, BSN, RN, CWS, Kim RN, BSN Entered By: Gretta Cool, BSN, RN, CWS, Kim on 02/02/2018 14:41:00 Holly Bennett ([id number]) -------------------------------------------------------------------------------- Lower Extremity Assessment Details Patient Name: LORI, POPOWSKI. Date of Service: 02/02/2018 2:00 PM Medical Record Number: [id number] Patient Account Number: [id number] Date of Birth/Sex: 08/04/1966 (52 y.o. F) Treating RN: Montey Hora Primary Care Katti Pelle: Princess Bruins Other Clinician: Referring Imagine Nest: Princess Bruins Treating Edmond Ginsberg/Extender: Ricard Dillon Weeks in Treatment: 5 Electronic Signature(s) Signed: 02/02/2018 5:03:40 PM By: Montey Hora Entered By: Montey Hora on 02/02/2018 14:19:49 Holly Bennett ([id number]) -------------------------------------------------------------------------------- Multi Wound Chart Details Patient Name: Holly Bennett. Date of Service: 02/02/2018 2:00 PM Medical Record Number: [id number] Patient Account Number: [id number] Date of Birth/Sex: [birth year] (52 y.o. F) Treating RN: Cornell Barman Primary Care Cathern Tahir: Princess Bruins Other Clinician: Referring Lanyia Jewel: Princess Bruins Treating Kendrell Lottman/Extender: Tito Dine in Treatment: 5 Vital Signs Height(in): 64 Pulse(bpm): 59 Weight(lbs): 151.6 Blood Pressure(mmHg): 130/78 Body Mass Index(BMI): 26 Temperature(F): 98.1 Respiratory Rate 18 (breaths/min): Photos: [1:No Photos] [N/A:N/A] Wound Location: [1:Left Breast] [N/A:N/A] Wounding Event: [1:Surgical Injury] [N/A:N/A] Primary Etiology: [1:Dehisced Wound] [N/A:N/A] Comorbid History: [1:Received Radiation] [N/A:N/A] Date Acquired: [1:12/08/2017] [N/A:N/A] Weeks of Treatment: [1:5] [N/A:N/A] Wound Status: [1:Healed - Epithelialized] [N/A:N/A] Measurements L x W x D [1:0x0x0] [N/A:N/A] (cm) Area (cm) : [1:0] [N/A:N/A] Volume (cm) : [1:0] [N/A:N/A] %  Reduction in Area: [1:100.00%] [N/A:N/A] % Reduction in Volume: [1:100.00%] [N/A:N/A] Classification: [1:Full Thickness Without Exposed Support Structures] [N/A:N/A] Exudate Amount: [1:None Present] [N/A:N/A] Foul Odor After Cleansing: [1:Yes] [N/A:N/A] Odor Anticipated Due to [1:No] [N/A:N/A] Product Use: Wound Margin: [1:Distinct, outline attached] [N/A:N/A] Granulation Amount: [1:None Present (0%)] [N/A:N/A] Necrotic Amount: [1:None Present (0%)] [N/A:N/A] Exposed Structures: [1:Fat Layer (Subcutaneous Tissue) Exposed: Yes Fascia: No Tendon: No Muscle: No Joint: No Bone: No] [N/A:N/A] Epithelialization: [1:Large (67-100%)] [N/A:N/A] Periwound Skin Texture: [1:No Abnormalities Noted] [N/A:N/A] Periwound Skin Moisture: [1:Maceration: No] [N/A:N/A] Periwound Skin Color: [1:No Abnormalities Noted] [N/A:N/A] Temperature: [1:No Abnormality] [N/A:N/A] Tenderness on Palpation: [1:Yes] [N/A:N/A] Wound Preparation: [N/A:N/A] Ulcer Cleansing: Rinsed/Irrigated with Saline Topical Anesthetic Applied: None Treatment Notes Electronic Signature(s) Signed: 02/02/2018 5:02:04 PM By: Linton Ham MD Entered By: Linton Ham on 02/02/2018 14:42:25 Holly Bennett  ([id number]) -------------------------------------------------------------------------------- Multi-Disciplinary Care Plan Details Patient Name: Holly Bennett Date of Service: 02/02/2018 2:00 PM Medical Record Number: [id number] Patient Account Number: [id number] Date of Birth/Sex: 1966/10/10 (52 y.o. F) Treating RN: Cornell Barman Primary Care Naod Sweetland: Princess Bruins Other Clinician: Referring Rodneshia Greenhouse: Princess Bruins Treating Maclain Cohron/Extender: Tito Dine in Treatment: 5 Active Inactive Electronic Signature(s) Signed: 02/02/2018 2:55:02 PM By: Gretta Cool, BSN, RN, CWS, Kim RN, BSN Entered By: Gretta Cool, BSN, RN, CWS, Kim on 02/02/2018 14:32:56 Holly Bennett ([id number]) -------------------------------------------------------------------------------- Pain Assessment Details Patient Name: Holly Bennett Date of Service: 02/02/2018 2:00 PM Medical Record Number: [id number] Patient Account Number: [id number] Date of Birth/Sex: Jul 07, 1966 (52 y.o. F) Treating RN: Cornell Barman Primary Care Shareef Eddinger: Princess Bruins Other Clinician: Referring Jaynee Winters: Princess Bruins Treating Alicen Donalson/Extender: Tito Dine in Treatment: 5 Active Problems Location of Pain Severity and Description of Pain Patient Has Paino No Site Locations Pain Management and Medication Current Pain Management: Electronic Signature(s) Signed: 02/02/2018 2:55:02 PM By: Gretta Cool, BSN, RN, CWS, Kim RN, BSN Signed: 02/02/2018 4:09:34 PM By: Lorine Bears RCP, RRT, CHT Entered By: Lorine Bears on 02/02/2018 14:11:15 Holly Bennett ([id number]) -------------------------------------------------------------------------------- Wound Assessment Details Patient Name: Holly Bennett. Date of Service: 02/02/2018 2:00 PM Medical Record Number: [id number] Patient Account Number: [id number] Date of Birth/Sex: 15-Mar-1966 (52 y.o. F) Treating RN: Cornell Barman Primary Care Eulogio Requena:  Princess Bruins Other Clinician: Referring Digna Countess: Princess Bruins Treating Skyler Dusing/Extender: Ricard Dillon Weeks in Treatment: 5 Wound Status Wound Number: 1 Primary Etiology: Dehisced Wound Wound Location: Left Breast Wound Status: Healed - Epithelialized Wounding Event: Surgical Injury Comorbid History: Received Radiation Date Acquired: 12/08/2017 Weeks Of Treatment: 5 Clustered Wound: No Photos Photo Uploaded By: Montey Hora on 02/02/2018 15:13:54 Wound Measurements Length: (cm) 0 Width: (cm) 0 Depth: (cm) 0 Area: (cm) 0 Volume: (cm) 0 % Reduction in Area: 100% % Reduction in Volume: 100% Epithelialization: Large (67-100%) Tunneling: No Undermining: No Wound Description Full Thickness Without Exposed Support Classification: Structures Wound Margin: Distinct, outline attached Exudate None Present Amount: Foul Odor After Cleansing: Yes Due to Product Use: No Slough/Fibrino Yes Wound Bed Granulation Amount: None Present (0%) Exposed Structure Necrotic Amount: None Present (0%) Fascia Exposed: No Fat Layer (Subcutaneous Tissue) Exposed: Yes Tendon Exposed: No Muscle Exposed: No Joint Exposed: No Bone Exposed: No Periwound Skin Texture Texture Color Bostock, Shyan H. ([id number]) No Abnormalities Noted: No No Abnormalities Noted: No Moisture Temperature / Pain No Abnormalities Noted: No Temperature: No Abnormality Maceration: No Tenderness on Palpation: Yes Wound Preparation Ulcer Cleansing: Rinsed/Irrigated with Saline Topical Anesthetic Applied: None Electronic Signature(s) Signed: 02/02/2018 2:55:02 PM By: Gretta Cool, BSN, RN, CWS, Kim RN,  BSN Entered By: Gretta Cool, BSN, RN, CWS, Kim on 02/02/2018 14:33:17 Holly Bennett ([id number]) -------------------------------------------------------------------------------- [place] Details Patient Name: Holly Bennett Date of Service: 02/02/2018 2:00 PM Medical Record Number: [id number] Patient Account  Number: [id number] Date of Birth/Sex: May 13, 1966 (52 y.o. F) Treating RN: Cornell Barman Primary Care Tomi Grandpre: Princess Bruins Other Clinician: Referring Stavroula Rohde: Princess Bruins Treating Jorian [place]ho[place]/Extender: Tito Dine in Treatment: 5 Vital Signs Time Taken: 14:10 Temperature (F): 98.1 Height (in): 64 Pulse (bpm): 68 Weight (lbs): 151.6 Respiratory Rate (breaths/min): 18 Body Mass Index (BMI): 26 Blood Pressure (mmHg): 130/78 Reference Range: 80 - 120 mg / dl Electronic Signature(s) Signed: 02/02/2018 4:09:34 PM By: Lorine Bears RCP, RRT, CHT Entered By: Lorine Bears on 02/02/2018 14:13:15

## 2018-02-04 NOTE — Progress Notes (Signed)
Holly Bennett, Holly Bennett ([id number]) Visit Report for 02/02/2018 HPI Details Patient Name: Holly Bennett, Holly Bennett. Date of Service: 02/02/2018 2:00 PM Medical Record Number: [id number] Patient Account Number: [id number] Date of Birth/Sex: 24-Oct-1966 (52 y.o. F) Treating RN: Holly Bennett Primary Care Provider: [name] Other Clinician: Referring Provider: [name] Treating Provider/Extender: Holly Bennett in Treatment: 5 History of Present Illness HPI Description: CLINICAL DATA: History of LEFT lumpectomy 11/17/2017. Patient had spontaneous opening of the lumpectomy cavity and continues to have fluid from the lumpectomy scar. She is on her 2nd round of antibiotics. LEFT breast remains painful. o EXAM: ULTRASOUND OF THE LEFT BREAST o COMPARISON: 11/17/2017 o FINDINGS: On physical exam, there is a healing wound in the 12 o'clock location of the LEFT breast. Patient is tender on exam. o Targeted ultrasound is performed, showing a hypoechoic collection deep to the scar in the 12:30 o'clock location of the LEFT breast 1 centimeter from the nipple. This collection shows mobile internal debris on real-time evaluation and measures 3.6 x 1.0 x 2.3 centimeters. o IMPRESSION: Seroma versus abscess in the UPPER portion of the LEFT breast. o RECOMMENDATION: Ultrasound-guided aspiration of LEFT fluid collection. o Continued antibiotic treatment. o I have discussed the findings and recommendations with the patient. Results were also provided in writing at the conclusion of the visit. If applicable, a reminder letter will be sent to the patient regarding the next appointment. o BI-RADS CATEGORY 2: Benign. o o Electronically Signed By: Holly Bennett M.D. On: 12/16/2017 10:31 Holly Bennett SURGICAL 12/20/2017 Holly Bennett, Holly Bennett ([id number]) o HPI: Patient is s/p left breast wire localized lumpectomy and sentinel node biopsy for left breast cancer on 1/23. Her postop course was  complicated by wound dehiscense with eschar formation, likely due to reaction to the blue dye during node biopsy. She was started on Silvadene last week by Dr. Adonis Bennett and she also has completed the abx course started the week prior. She reports the wound continues to heal well and she's still having some serosanguinous drainage, though also some blue dye remnant staining on the gauze. Denies any fevers, chills. o Vital signs: Wt 68.9 kg (152 lb)  BMI 26.09 kg/mo o Physical Exam: Constitutional: No acute distress Breast: Left breast incision healing better, with eschar now off, with some fibrinous material at the inferior portion of the wound. This was sharply excised at bedside. The other side of the wound has healthy granulation tissue. No evidence of infection. There is still induration of the skin surrounding the nipple/areola complex, but improved compared to how it was on the last visit with me. o Assessment/Plan: 52 yo female s/p left breast wire-localized lumpectomy and sentinel node biopsy o --sharply excised fibrinous material over inferior portion of wound. Rest of wound healing well. There is still a small cavity but healing well overall. --continue dry gauze dressing changes daily and as needed. --continue silvadene ointment --will follow up next week with me. o o Holly Bennett, Holly Bennett 12/29/17; relevant information above including her breast ultrasound in last note from her surgeon. 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 She was discovered to have a malignant neoplasm in the central portion of her left breast by routine mammography. She underwent a left breast lumpectomy with axillary node biopsy on 11/17/17; apparently the margins of this were clean. Some postoperative discomfort and redness which she really didn't think was out of the ordinary however 2 weeks later she is able to  show [place]  a picture of a small area in the surgical incision with a necrotic-looking wound bed.the wound started draining. the wound progressed and dehisced. She had an ultrasound of the left breast on 2/21 and fluid was aspirated that apparently cultured Klebsiella oxytocin. She was given antibiotics although at this point I am not quite sure which antibiotics were prescribed. In any case the wound is had progressive opening. She had some debridement by her surgeon. She has been using Silvadene cream to the wound area. She is not having unbearable pain however she finds it difficult to be able to work. Surgery felt that this might be a reaction to the "blue dye" used for lymph node biopsy. I told the patient I have no experience with this. In any case she is been left with a fairly sizable opening in the left breast currently with a depth of 1.2 cm from 3 to 9:00 3 cm of undermining. She has not been systemically unwell 01/05/18; culture of the purulent drainage I did last week largely from the lateral part of the open wound grew a few methicillin sensitive staph aureus. She is on Septra DS 1 by mouth twice a day for 10 days. She states she is already noted improvement. We packed the wound with silver alginate rope. The wound is contracted nicely and there is certainly less drainage although still 0.8 cm in depth. 01/12/18-she is here in follow-up evaluation for left breast wound. There is improvement in measurement in appearance. She has not yet completed her antibiotic therapy. She is voicing no complaint or concerns, denies fever. We will continue with same treatment plan and follow-up next week 01/19/18; she is here for evaluation of her left breast surgical wound. There is improvement in the appearance of the overall area and the orifice of the wound is certainly contracted. She has completed her original antibiotic therapy. She has no pain or fever. It is becoming difficult to pack the silver alginate  into the wound because the orifice is small and there is still probing depth going laterally 01/26/18; patient is here for evaluation of her left breast surgical wound/lumpectomy for early-stage carcinoma. An EKG was complicated by an infected seroma and then mastitis. Also been successfully treated. There was still some drainage last week Avitabile, Kariya H. ([id number]) which I cultured however this was negative. His come down in size both the external orifice and the depth of 1.3 cm. I think we can try and let this close now with simply topical dressings and then she can get on with adjunctive radiation. 02/02/18; the patient's wound is closed over. This was a surgical wound complicated by postoperative infection and a postoperative seroma in the left breast. Electronic Signature(s) Signed: 02/02/2018 5:02:04 PM By: Linton Ham MD Entered By: Linton Ham on 02/02/2018 14:43:39 Holly Bennett ([id number]) -------------------------------------------------------------------------------- Physical Exam Details Patient Name: Holly Bennett. Date of Service: 02/02/2018 2:00 PM Medical Record Number: [id number] Patient Account Number: [id number] Date of Birth/Sex: 1965/11/18 (52 y.o. F) Treating RN: Holly Bennett Primary Care Provider: [name] Other Clinician: Referring Provider: [name] Treating Provider/Extender: Holly Bennett in Treatment: 5 Constitutional Sitting or standing Blood Pressure is within target range for patient.. Pulse regular and within target range for patient.Marland Kitchen Respirations regular, non-labored and within target range.. Temperature is normal and within the target range for the patient.Marland Kitchen appears in no distress. Chest Breasts symmetrical and no nipple discharge.. left breast this feels normal. There are no masses no drainage  the original wound is totally closed over. Notes wound exam; left breast surgical wound site. The site is closed palpation  of the tissue is normal Electronic Signature(s) Signed: 02/02/2018 5:02:04 PM By: Linton Ham MD Entered By: Linton Ham on 02/02/2018 14:44:44 Holly Bennett ([id number]) -------------------------------------------------------------------------------- Physician Orders Details Patient Name: Holly Bennett Date of Service: 02/02/2018 2:00 PM Medical Record Number: [id number] Patient Account Number: [id number] Date of Birth/Sex: August 13, 1966 (52 y.o. F) Treating RN: Holly Bennett Primary Care Provider: [name] Other Clinician: Referring Provider: [name] Treating Provider/Extender: Holly Bennett in Treatment: 5 Verbal / Phone Orders: No Diagnosis Coding Discharge From [facility] Services o Discharge from [place] complete Electronic Signature(s) Signed: 02/02/2018 2:55:02 PM By: Gretta Cool, BSN, RN, CWS, Kim RN, BSN Signed: 02/02/2018 5:02:04 PM By: Linton Ham MD Entered By: Gretta Cool, BSN, RN, CWS, Kim on 02/02/2018 14:33:46 Holly Bennett ([id number]) -------------------------------------------------------------------------------- Problem List Details Patient Name: LAYIA, WALLA. Date of Service: 02/02/2018 2:00 PM Medical Record Number: [id number] Patient Account Number: [id number] Date of Birth/Sex: 1966/02/20 (52 y.o. F) Treating RN: Holly Bennett Primary Care Provider: [name] Other Clinician: Referring Provider: [name] Treating Provider/Extender: Holly Bennett in Treatment: 5 Active Problems ICD-10 Impacting Encounter Code Description Active Date Wound Healing Diagnosis T81.31XD Disruption of external operation (surgical) wound, not 12/29/2017 Yes elsewhere classified, subsequent encounter C50.112 Malignant neoplasm of central portion of left female breast 12/29/2017 Yes S21.002D Unspecified open wound of left breast, subsequent encounter 12/29/2017 Yes Inactive Problems Resolved Problems Electronic  Signature(s) Signed: 02/02/2018 5:02:04 PM By: Linton Ham MD Entered By: Linton Ham on 02/02/2018 14:42:09 Holly Bennett ([id number]) -------------------------------------------------------------------------------- Progress Note Details Patient Name: Holly Bennett Date of Service: 02/02/2018 2:00 PM Medical Record Number: [id number] Patient Account Number: [id number] Date of Birth/Sex: 11/01/1965 (52 y.o. F) Treating RN: Holly Bennett Primary Care Provider: [name] Other Clinician: Referring Provider: [name] Treating Provider/Extender: Holly Bennett in Treatment: 5 Subjective History of Present Illness (HPI) CLINICAL DATA: History of LEFT lumpectomy 11/17/2017. Patient had spontaneous opening of the lumpectomy cavity and continues to have fluid from the lumpectomy scar. She is on her 2nd round of antibiotics. LEFT breast remains painful. EXAM: ULTRASOUND OF THE LEFT BREAST COMPARISON: 11/17/2017 FINDINGS: On physical exam, there is a healing wound in the 12 o'clock location of the LEFT breast. Patient is tender on exam. Targeted ultrasound is performed, showing a hypoechoic collection deep to the scar in the 12:30 o'clock location of the LEFT breast 1 centimeter from the nipple. This collection shows mobile internal debris on real-time evaluation and measures 3.6 x 1.0 x 2.3 centimeters. IMPRESSION: Seroma versus abscess in the UPPER portion of the LEFT breast. RECOMMENDATION: Ultrasound-guided aspiration of LEFT fluid collection. Continued antibiotic treatment. I have discussed the findings and recommendations with the patient. Results were also provided in writing at the conclusion of the visit. If applicable, a reminder letter will be sent to the patient regarding the next appointment. BI-RADS CATEGORY 2: Benign. Electronically Signed By: Holly Bennett M.D. On: 12/16/2017 10:31 [place] SURGICAL 12/20/2017 HPI: Holly Bennett, Holly Bennett  ([id number]) Patient is s/p left breast wire localized lumpectomy and sentinel node biopsy for left breast cancer on 1/23. Her postop course was complicated by wound dehiscense with eschar formation, likely due to reaction to the blue dye during node biopsy. She was started on Silvadene last week by Dr. Adonis Bennett and she also has completed the abx course started the week prior. She  reports the wound continues to heal well and she's still having some serosanguinous drainage, though also some blue dye remnant staining on the gauze. Denies any fevers, chills. Vital signs: Wt 68.9 kg (152 lb)  BMI 26.09 kg/m Physical Exam: Constitutional: No acute distress Breast: Left breast incision healing better, with eschar now off, with some fibrinous material at the inferior portion of the wound. This was sharply excised at bedside. The other side of the wound has healthy granulation tissue. No evidence of infection. There is still induration of the skin surrounding the nipple/areola complex, but improved compared to how it was on the last visit with me. Assessment/Plan: 52 yo female s/p left breast wire-localized lumpectomy and sentinel node biopsy --sharply excised fibrinous material over inferior portion of wound. Rest of wound healing well. There is still a small cavity but healing well overall. --continue dry gauze dressing changes daily and as needed. --continue silvadene ointment --will follow up next week with me. Holly Bennett, [place] 12/29/17; relevant information above including her breast ultrasound in last note from her surgeon. 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 She was discovered to have a malignant neoplasm in the central portion of her left breast by routine mammography. She underwent a left breast lumpectomy with axillary node biopsy on 11/17/17; apparently the margins of this were clean. Some  postoperative discomfort and redness which she really didn't think was out of the ordinary however 2 weeks later she is able to show [place] a picture of a small area in the surgical incision with a necrotic-looking wound bed.the wound started draining. the wound progressed and dehisced. She had an ultrasound of the left breast on 2/21 and fluid was aspirated that apparently cultured Klebsiella oxytocin. She was given antibiotics although at this point I am not quite sure which antibiotics were prescribed. In any case the wound is had progressive opening. She had some debridement by her surgeon. She has been using Silvadene cream to the wound area. She is not having unbearable pain however she finds it difficult to be able to work. Surgery felt that this might be a reaction to the "blue dye" used for lymph node biopsy. I told the patient I have no experience with this. In any case she is been left with a fairly sizable opening in the left breast currently with a depth of 1.2 cm from 3 to 9:00 3 cm of undermining. She has not been systemically unwell 01/05/18; culture of the purulent drainage I did last week largely from the lateral part of the open wound grew a few methicillin sensitive staph aureus. She is on Septra DS 1 by mouth twice a day for 10 days. She states she is already noted improvement. We packed the wound with silver alginate rope. The wound is contracted nicely and there is certainly less drainage although still 0.8 cm in depth. 01/12/18-she is here in follow-up evaluation for left breast wound. There is improvement in measurement in appearance. She has not yet completed her antibiotic therapy. She is voicing no complaint or concerns, denies fever. We will continue with same treatment plan and follow-up next week 01/19/18; she is here for evaluation of her left breast surgical wound. There is improvement in the appearance of the overall area and the orifice of the wound is certainly  contracted. She has completed her original antibiotic therapy. She has no pain or fever. It is becoming difficult to pack the silver  alginate into the wound because the orifice is small and there is still probing depth going laterally 01/26/18; patient is here for evaluation of her left breast surgical wound/lumpectomy for early-stage carcinoma. An EKG was complicated by an infected seroma and then mastitis. Also been successfully treated. There was still some drainage last week which I cultured however this was negative. His come down in size both the external orifice and the depth of 1.3 cm. I think we can try and let this close now with simply topical dressings and then she can get on with adjunctive radiation. Holly Bennett, Holly Bennett ([id number]) 02/02/18; the patient's wound is closed over. This was a surgical wound complicated by postoperative infection and a postoperative seroma in the left breast. Objective Constitutional Sitting or standing Blood Pressure is within target range for patient.. Pulse regular and within target range for patient.Marland Kitchen Respirations regular, non-labored and within target range.. Temperature is normal and within the target range for the patient.Marland Kitchen appears in no distress. Vitals Time Taken: 2:10 PM, Height: 64 in, Weight: 151.6 lbs, BMI: 26, Temperature: 98.1 F, Pulse: 68 bpm, Respiratory Rate: 18 breaths/min, Blood Pressure: 130/78 mmHg. Chest Breasts symmetrical and no nipple discharge.. left breast this feels normal. There are no masses no drainage the original wound is totally closed over. General Notes: wound exam; left breast surgical wound site. The site is closed palpation of the tissue is normal Integumentary (Hair, Skin) Wound #1 status is Healed - Epithelialized. Original cause of wound was Surgical Injury. The wound is located on the Left Breast. The wound measures 0cm length x 0cm width x 0cm depth; 0cm^2 area and 0cm^3 volume. There is Fat Layer (Subcutaneous  Tissue) Exposed exposed. There is no tunneling or undermining noted. There is a none present amount of drainage noted. Foul odor after cleansing was noted. The wound margin is distinct with the outline attached to the wound base. There is no granulation within the wound bed. There is no necrotic tissue within the wound bed. The periwound skin appearance did not exhibit: Maceration. Periwound temperature was noted as No Abnormality. The periwound has tenderness on palpation. Assessment Active Problems ICD-10 T81.31XD - Disruption of external operation (surgical) wound, not elsewhere classified, subsequent encounter C50.112 - Malignant neoplasm of central portion of left female breast S21.002D - Unspecified open wound of left breast, subsequent encounter Plan Holly Bennett, Holly Bennett ([id number]) Discharge From [facility] Services: Discharge from [place] complete #1 the patient to be discharged from the wound care center #2 follow-up with radiation oncology as on April 25 present this makes some sense. Hopefully that swollen the area mature more so she can tolerate ancillary radiation Electronic Signature(s) Signed: 02/02/2018 5:02:04 PM By: Linton Ham MD Entered By: Linton Ham on 02/02/2018 14:45:33 Holly Bennett ([id number]) -------------------------------------------------------------------------------- [place] Details Patient Name: Holly Bennett. Date of Service: 02/02/2018 Medical Record Number: [id number] Patient Account Number: [id number] Date of Birth/Sex: [birth year] (52 y.o. F) Treating RN: Holly Bennett Primary Care Provider: [name] Other Clinician: Referring Provider: [name] Treating Provider/Extender: Holly Bennett in Treatment: 5 Diagnosis Coding ICD-10 Codes Code Description T81.31XD Disruption of external operation (surgical) wound, not elsewhere classified, subsequent encounter C50.112 Malignant neoplasm of central  portion of left female breast S21.002D Unspecified open wound of left breast, subsequent encounter Facility Procedures CPT4 Code: [id number] Description: 67124 - WOUND CARE VISIT-LEV 2 EST PT Modifier: Quantity: 1 Physician Procedures CPT4: Description Modifier Quantity Code [id number] 38250 - WC PHYS LEVEL 2 - EST  PT 1 ICD-10 Diagnosis Description T81.31XD Disruption of external operation (surgical) wound, not elsewhere classified, subsequent encounter S21.002D Unspecified open wound of  left breast, subsequent encounter Electronic Signature(s) Signed: 02/02/2018 5:02:04 PM By: Linton Ham MD Entered By: Linton Ham on 02/02/2018 14:45:59

## 2018-02-08 DIAGNOSIS — Z85828 Personal history of other malignant neoplasm of skin: Secondary | ICD-10-CM | POA: Diagnosis not present

## 2018-02-08 DIAGNOSIS — L820 Inflamed seborrheic keratosis: Secondary | ICD-10-CM | POA: Diagnosis not present

## 2018-02-08 DIAGNOSIS — D225 Melanocytic nevi of trunk: Secondary | ICD-10-CM | POA: Diagnosis not present

## 2018-02-08 DIAGNOSIS — Z1283 Encounter for screening for malignant neoplasm of skin: Secondary | ICD-10-CM | POA: Diagnosis not present

## 2018-02-09 ENCOUNTER — Ambulatory Visit: Payer: 59 | Admitting: Internal Medicine

## 2018-02-14 ENCOUNTER — Ambulatory Visit: Payer: 59 | Admitting: Radiation Oncology

## 2018-02-17 ENCOUNTER — Ambulatory Visit
Admission: RE | Admit: 2018-02-17 | Discharge: 2018-02-17 | Disposition: A | Discharge status: Home / Self Care | Payer: 59 | Source: Ambulatory Visit | Attending: Radiation Oncology | Admitting: Radiation Oncology

## 2018-02-17 ENCOUNTER — Other Ambulatory Visit: Payer: Self-pay

## 2018-02-17 VITALS — BP 130/86 | HR 70 | Temp 97.2°F | Resp 12 | Ht 64.5 in | Wt 154.2 lb

## 2018-02-17 DIAGNOSIS — D0512 Intraductal carcinoma in situ of left breast: Secondary | ICD-10-CM | POA: Diagnosis not present

## 2018-02-17 DIAGNOSIS — Z170 Estrogen receptor positive status [ER+]: Secondary | ICD-10-CM | POA: Insufficient documentation

## 2018-02-17 NOTE — Progress Notes (Signed)
Radiation Oncology Follow up Note  Name: Holly Bennett   Date:   02/17/2018 MRN:  [id number] DOB: 1966/01/30    This 52 y.o. female presents to the clinic today for follow-up for stage I invasive mammary carcinoma left breast with wound healing issues over the past 3 months.  REFERRING PROVIDER: Princess Bruins, MD  HPI: patient is a 52 year old female with a stage I (T1 be N0 M0) invasive mammary carcinoma of the.left breast status post wide local excision and sentinel node biopsy tumor was ER/PR positive HER-2/neu negative. I recommended a hypofractionated course of treatment to her left breast although she developed wound issues and has been seen in the wound clinic. The area is well-healed at this time she is now seen to begin treatment planning for whole breast radiation. She specifically denies breast tenderness cough or bone pain.  COMPLICATIONS OF TREATMENT: none  FOLLOW UP COMPLIANCE: keeps appointments   PHYSICAL EXAM:  BP 130/86 (BP Location: Right Arm, Patient Position: Sitting, Cuff Size: Small)   Pulse 70   Temp (!) 97.2 F (36.2 C)   Resp 12   Ht 5' 4.5" (1.638 m)   Wt 154 lb 3.4 oz (70 kg)   BMI 26.06 kg/m  Left breast incision at the 2:00 position is healed well still some slight erythema around the scar. No dominant mass or nodularity is noted in either breast in 2 positions examined. No axillary or supraclavicular adenopathy is appreciated. Well-developed well-nourished patient in NAD. HEENT reveals PERLA, EOMI, discs not visualized.  Oral cavity is clear. No oral mucosal lesions are identified. Neck is clear without evidence of cervical or supraclavicular adenopathy. Lungs are clear to A&P. Cardiac examination is essentially unremarkable with regular rate and rhythm without murmur rub or thrill. Abdomen is benign with no organomegaly or masses noted. Motor sensory and DTR levels are equal and symmetric in the upper and lower extremities. Cranial nerves II through XII  are grossly intact. Proprioception is intact. No peripheral adenopathy or edema is identified. No motor or sensory levels are noted. Crude visual fields are within normal range.  RADIOLOGY RESULTS: no current films for review  PLAN: his time the patient's clear to begin treatment planning for her whole breast radiation. I will plan on delivering hypofractionated course of treatment over 4 weeks. Would also boost her scar another 1400 cGy using electron beam. Risks and benefits of treatment including skin reaction fatigue alteration of blood counts possible inclusion of superficial lung all were discussed in detail with the patient. She seems to comprehend my treatment plan well. I personally set up and ordered CT simulation for next week.  I would like to take this opportunity to thank you for allowing me to participate in the care of your patient.Noreene Filbert, MD

## 2018-02-22 ENCOUNTER — Ambulatory Visit
Admission: RE | Admit: 2018-02-22 | Discharge: 2018-02-22 | Disposition: A | Discharge status: Home / Self Care | Payer: 59 | Source: Ambulatory Visit | Attending: Radiation Oncology | Admitting: Radiation Oncology

## 2018-02-22 DIAGNOSIS — D0512 Intraductal carcinoma in situ of left breast: Secondary | ICD-10-CM | POA: Diagnosis not present

## 2018-02-22 DIAGNOSIS — Z170 Estrogen receptor positive status [ER+]: Secondary | ICD-10-CM | POA: Diagnosis not present

## 2018-02-22 DIAGNOSIS — Z510 Encounter for antineoplastic radiation therapy: Secondary | ICD-10-CM | POA: Insufficient documentation

## 2018-02-24 DIAGNOSIS — Z510 Encounter for antineoplastic radiation therapy: Secondary | ICD-10-CM | POA: Insufficient documentation

## 2018-02-24 DIAGNOSIS — C50112 Malignant neoplasm of central portion of left female breast: Secondary | ICD-10-CM | POA: Diagnosis not present

## 2018-02-24 DIAGNOSIS — Z170 Estrogen receptor positive status [ER+]: Secondary | ICD-10-CM | POA: Diagnosis not present

## 2018-02-24 DIAGNOSIS — D0512 Intraductal carcinoma in situ of left breast: Secondary | ICD-10-CM | POA: Diagnosis not present

## 2018-02-25 ENCOUNTER — Other Ambulatory Visit: Payer: Self-pay | Admitting: *Deleted

## 2018-02-25 DIAGNOSIS — D0512 Intraductal carcinoma in situ of left breast: Secondary | ICD-10-CM

## 2018-03-01 ENCOUNTER — Ambulatory Visit
Admission: RE | Admit: 2018-03-01 | Discharge: 2018-03-01 | Disposition: A | Discharge status: Home / Self Care | Payer: 59 | Source: Ambulatory Visit | Attending: Radiation Oncology | Admitting: Radiation Oncology

## 2018-03-01 DIAGNOSIS — Z170 Estrogen receptor positive status [ER+]: Secondary | ICD-10-CM | POA: Diagnosis not present

## 2018-03-01 DIAGNOSIS — D0512 Intraductal carcinoma in situ of left breast: Secondary | ICD-10-CM | POA: Diagnosis not present

## 2018-03-01 DIAGNOSIS — Z510 Encounter for antineoplastic radiation therapy: Secondary | ICD-10-CM | POA: Diagnosis not present

## 2018-03-02 ENCOUNTER — Ambulatory Visit
Admission: RE | Admit: 2018-03-02 | Discharge: 2018-03-02 | Disposition: A | Discharge status: Home / Self Care | Payer: 59 | Source: Ambulatory Visit | Attending: Radiation Oncology | Admitting: Radiation Oncology

## 2018-03-02 DIAGNOSIS — Z170 Estrogen receptor positive status [ER+]: Secondary | ICD-10-CM | POA: Diagnosis not present

## 2018-03-02 DIAGNOSIS — D0512 Intraductal carcinoma in situ of left breast: Secondary | ICD-10-CM | POA: Diagnosis not present

## 2018-03-02 DIAGNOSIS — Z510 Encounter for antineoplastic radiation therapy: Secondary | ICD-10-CM | POA: Diagnosis not present

## 2018-03-03 ENCOUNTER — Ambulatory Visit
Admission: RE | Admit: 2018-03-03 | Discharge: 2018-03-03 | Disposition: A | Discharge status: Home / Self Care | Payer: 59 | Source: Ambulatory Visit | Attending: Radiation Oncology | Admitting: Radiation Oncology

## 2018-03-03 DIAGNOSIS — D0512 Intraductal carcinoma in situ of left breast: Secondary | ICD-10-CM | POA: Diagnosis not present

## 2018-03-03 DIAGNOSIS — Z510 Encounter for antineoplastic radiation therapy: Secondary | ICD-10-CM | POA: Diagnosis not present

## 2018-03-03 DIAGNOSIS — Z170 Estrogen receptor positive status [ER+]: Secondary | ICD-10-CM | POA: Diagnosis not present

## 2018-03-04 ENCOUNTER — Ambulatory Visit
Admission: RE | Admit: 2018-03-04 | Discharge: 2018-03-04 | Disposition: A | Discharge status: Home / Self Care | Payer: 59 | Source: Ambulatory Visit | Attending: Radiation Oncology | Admitting: Radiation Oncology

## 2018-03-04 DIAGNOSIS — Z510 Encounter for antineoplastic radiation therapy: Secondary | ICD-10-CM | POA: Diagnosis not present

## 2018-03-04 DIAGNOSIS — D0512 Intraductal carcinoma in situ of left breast: Secondary | ICD-10-CM | POA: Diagnosis not present

## 2018-03-04 DIAGNOSIS — Z170 Estrogen receptor positive status [ER+]: Secondary | ICD-10-CM | POA: Diagnosis not present

## 2018-03-07 ENCOUNTER — Ambulatory Visit
Admission: RE | Admit: 2018-03-07 | Discharge: 2018-03-07 | Disposition: A | Discharge status: Home / Self Care | Payer: 59 | Source: Ambulatory Visit | Attending: Radiation Oncology | Admitting: Radiation Oncology

## 2018-03-07 DIAGNOSIS — Z170 Estrogen receptor positive status [ER+]: Secondary | ICD-10-CM | POA: Diagnosis not present

## 2018-03-07 DIAGNOSIS — D0512 Intraductal carcinoma in situ of left breast: Secondary | ICD-10-CM | POA: Diagnosis not present

## 2018-03-07 DIAGNOSIS — Z510 Encounter for antineoplastic radiation therapy: Secondary | ICD-10-CM | POA: Diagnosis not present

## 2018-03-08 ENCOUNTER — Ambulatory Visit
Admission: RE | Admit: 2018-03-08 | Discharge: 2018-03-08 | Disposition: A | Discharge status: Home / Self Care | Payer: 59 | Source: Ambulatory Visit | Attending: Radiation Oncology | Admitting: Radiation Oncology

## 2018-03-08 DIAGNOSIS — Z170 Estrogen receptor positive status [ER+]: Secondary | ICD-10-CM | POA: Diagnosis not present

## 2018-03-08 DIAGNOSIS — D0512 Intraductal carcinoma in situ of left breast: Secondary | ICD-10-CM | POA: Diagnosis not present

## 2018-03-08 DIAGNOSIS — Z510 Encounter for antineoplastic radiation therapy: Secondary | ICD-10-CM | POA: Diagnosis not present

## 2018-03-09 ENCOUNTER — Ambulatory Visit: Payer: 59 | Admitting: Oncology

## 2018-03-09 ENCOUNTER — Ambulatory Visit
Admission: RE | Admit: 2018-03-09 | Discharge: 2018-03-09 | Disposition: A | Discharge status: Home / Self Care | Payer: 59 | Source: Ambulatory Visit | Attending: Radiation Oncology | Admitting: Radiation Oncology

## 2018-03-09 DIAGNOSIS — Z510 Encounter for antineoplastic radiation therapy: Secondary | ICD-10-CM | POA: Diagnosis not present

## 2018-03-09 DIAGNOSIS — Z170 Estrogen receptor positive status [ER+]: Secondary | ICD-10-CM | POA: Diagnosis not present

## 2018-03-09 DIAGNOSIS — D0512 Intraductal carcinoma in situ of left breast: Secondary | ICD-10-CM | POA: Diagnosis not present

## 2018-03-10 ENCOUNTER — Ambulatory Visit
Admission: RE | Admit: 2018-03-10 | Discharge: 2018-03-10 | Disposition: A | Discharge status: Home / Self Care | Payer: 59 | Source: Ambulatory Visit | Attending: Radiation Oncology | Admitting: Radiation Oncology

## 2018-03-10 DIAGNOSIS — D0512 Intraductal carcinoma in situ of left breast: Secondary | ICD-10-CM | POA: Diagnosis not present

## 2018-03-10 DIAGNOSIS — Z170 Estrogen receptor positive status [ER+]: Secondary | ICD-10-CM | POA: Diagnosis not present

## 2018-03-10 DIAGNOSIS — Z510 Encounter for antineoplastic radiation therapy: Secondary | ICD-10-CM | POA: Diagnosis not present

## 2018-03-11 ENCOUNTER — Ambulatory Visit: Payer: 59

## 2018-03-14 ENCOUNTER — Inpatient Hospital Stay: Payer: 59 | Attending: Radiation Oncology

## 2018-03-14 ENCOUNTER — Ambulatory Visit
Admission: RE | Admit: 2018-03-14 | Discharge: 2018-03-14 | Disposition: A | Discharge status: Home / Self Care | Payer: 59 | Source: Ambulatory Visit | Attending: Radiation Oncology | Admitting: Radiation Oncology

## 2018-03-14 DIAGNOSIS — Z510 Encounter for antineoplastic radiation therapy: Secondary | ICD-10-CM | POA: Diagnosis not present

## 2018-03-14 DIAGNOSIS — Z170 Estrogen receptor positive status [ER+]: Secondary | ICD-10-CM | POA: Diagnosis not present

## 2018-03-14 DIAGNOSIS — D0512 Intraductal carcinoma in situ of left breast: Secondary | ICD-10-CM | POA: Insufficient documentation

## 2018-03-14 LAB — CBC
HEMATOCRIT: 35.8 % (ref 35.0–47.0)
Hemoglobin: 12.4 g/dL (ref 12.0–16.0)
MCH: 30.6 pg (ref 26.0–34.0)
MCHC: 34.6 g/dL (ref 32.0–36.0)
MCV: 88.3 fL (ref 80.0–100.0)
PLATELETS: 276 10*3/uL (ref 150–440)
RBC: 4.06 MIL/uL (ref 3.80–5.20)
RDW: 12.9 % (ref 11.5–14.5)
WBC: 6.8 10*3/uL (ref 3.6–11.0)

## 2018-03-15 ENCOUNTER — Ambulatory Visit
Admission: RE | Admit: 2018-03-15 | Discharge: 2018-03-15 | Disposition: A | Discharge status: Home / Self Care | Payer: 59 | Source: Ambulatory Visit | Attending: Radiation Oncology | Admitting: Radiation Oncology

## 2018-03-15 DIAGNOSIS — Z510 Encounter for antineoplastic radiation therapy: Secondary | ICD-10-CM | POA: Diagnosis not present

## 2018-03-15 DIAGNOSIS — D0512 Intraductal carcinoma in situ of left breast: Secondary | ICD-10-CM | POA: Diagnosis not present

## 2018-03-15 DIAGNOSIS — Z170 Estrogen receptor positive status [ER+]: Secondary | ICD-10-CM | POA: Diagnosis not present

## 2018-03-16 ENCOUNTER — Ambulatory Visit
Admission: RE | Admit: 2018-03-16 | Discharge: 2018-03-16 | Disposition: A | Discharge status: Home / Self Care | Payer: 59 | Source: Ambulatory Visit | Attending: Radiation Oncology | Admitting: Radiation Oncology

## 2018-03-16 DIAGNOSIS — Z170 Estrogen receptor positive status [ER+]: Secondary | ICD-10-CM | POA: Diagnosis not present

## 2018-03-16 DIAGNOSIS — D0512 Intraductal carcinoma in situ of left breast: Secondary | ICD-10-CM | POA: Diagnosis not present

## 2018-03-16 DIAGNOSIS — Z510 Encounter for antineoplastic radiation therapy: Secondary | ICD-10-CM | POA: Diagnosis not present

## 2018-03-17 ENCOUNTER — Ambulatory Visit
Admission: RE | Admit: 2018-03-17 | Discharge: 2018-03-17 | Disposition: A | Discharge status: Home / Self Care | Payer: 59 | Source: Ambulatory Visit | Attending: Radiation Oncology | Admitting: Radiation Oncology

## 2018-03-17 DIAGNOSIS — D0512 Intraductal carcinoma in situ of left breast: Secondary | ICD-10-CM | POA: Diagnosis not present

## 2018-03-17 DIAGNOSIS — Z170 Estrogen receptor positive status [ER+]: Secondary | ICD-10-CM | POA: Diagnosis not present

## 2018-03-17 DIAGNOSIS — Z510 Encounter for antineoplastic radiation therapy: Secondary | ICD-10-CM | POA: Diagnosis not present

## 2018-03-18 ENCOUNTER — Ambulatory Visit
Admission: RE | Admit: 2018-03-18 | Discharge: 2018-03-18 | Disposition: A | Discharge status: Home / Self Care | Payer: 59 | Source: Ambulatory Visit | Attending: Radiation Oncology | Admitting: Radiation Oncology

## 2018-03-18 DIAGNOSIS — D0512 Intraductal carcinoma in situ of left breast: Secondary | ICD-10-CM | POA: Diagnosis not present

## 2018-03-18 DIAGNOSIS — Z170 Estrogen receptor positive status [ER+]: Secondary | ICD-10-CM | POA: Diagnosis not present

## 2018-03-18 DIAGNOSIS — Z510 Encounter for antineoplastic radiation therapy: Secondary | ICD-10-CM | POA: Diagnosis not present

## 2018-03-22 ENCOUNTER — Ambulatory Visit
Admission: RE | Admit: 2018-03-22 | Discharge: 2018-03-22 | Disposition: A | Discharge status: Home / Self Care | Payer: 59 | Source: Ambulatory Visit | Attending: Radiation Oncology | Admitting: Radiation Oncology

## 2018-03-22 DIAGNOSIS — Z510 Encounter for antineoplastic radiation therapy: Secondary | ICD-10-CM | POA: Diagnosis not present

## 2018-03-22 DIAGNOSIS — D0512 Intraductal carcinoma in situ of left breast: Secondary | ICD-10-CM | POA: Diagnosis not present

## 2018-03-22 DIAGNOSIS — Z170 Estrogen receptor positive status [ER+]: Secondary | ICD-10-CM | POA: Diagnosis not present

## 2018-03-23 ENCOUNTER — Ambulatory Visit
Admission: RE | Admit: 2018-03-23 | Discharge: 2018-03-23 | Disposition: A | Discharge status: Home / Self Care | Payer: 59 | Source: Ambulatory Visit | Attending: Radiation Oncology | Admitting: Radiation Oncology

## 2018-03-23 DIAGNOSIS — Z510 Encounter for antineoplastic radiation therapy: Secondary | ICD-10-CM | POA: Diagnosis not present

## 2018-03-23 DIAGNOSIS — Z170 Estrogen receptor positive status [ER+]: Secondary | ICD-10-CM | POA: Diagnosis not present

## 2018-03-23 DIAGNOSIS — D0512 Intraductal carcinoma in situ of left breast: Secondary | ICD-10-CM | POA: Diagnosis not present

## 2018-03-24 ENCOUNTER — Ambulatory Visit
Admission: RE | Admit: 2018-03-24 | Discharge: 2018-03-24 | Disposition: A | Discharge status: Home / Self Care | Payer: 59 | Source: Ambulatory Visit | Attending: Radiation Oncology | Admitting: Radiation Oncology

## 2018-03-24 DIAGNOSIS — Z170 Estrogen receptor positive status [ER+]: Secondary | ICD-10-CM | POA: Diagnosis not present

## 2018-03-24 DIAGNOSIS — D0512 Intraductal carcinoma in situ of left breast: Secondary | ICD-10-CM | POA: Diagnosis not present

## 2018-03-24 DIAGNOSIS — Z510 Encounter for antineoplastic radiation therapy: Secondary | ICD-10-CM | POA: Diagnosis not present

## 2018-03-25 ENCOUNTER — Ambulatory Visit: Payer: 59

## 2018-03-25 ENCOUNTER — Ambulatory Visit
Admission: RE | Admit: 2018-03-25 | Discharge: 2018-03-25 | Disposition: A | Discharge status: Home / Self Care | Payer: 59 | Source: Ambulatory Visit | Attending: Radiation Oncology | Admitting: Radiation Oncology

## 2018-03-25 DIAGNOSIS — Z170 Estrogen receptor positive status [ER+]: Secondary | ICD-10-CM | POA: Diagnosis not present

## 2018-03-25 DIAGNOSIS — D0512 Intraductal carcinoma in situ of left breast: Secondary | ICD-10-CM | POA: Diagnosis not present

## 2018-03-25 DIAGNOSIS — Z510 Encounter for antineoplastic radiation therapy: Secondary | ICD-10-CM | POA: Diagnosis not present

## 2018-03-28 ENCOUNTER — Ambulatory Visit
Admission: RE | Admit: 2018-03-28 | Discharge: 2018-03-28 | Disposition: A | Discharge status: Home / Self Care | Payer: 59 | Source: Ambulatory Visit | Attending: Radiation Oncology | Admitting: Radiation Oncology

## 2018-03-28 ENCOUNTER — Inpatient Hospital Stay: Payer: 59 | Attending: Radiation Oncology

## 2018-03-28 ENCOUNTER — Other Ambulatory Visit: Payer: Self-pay

## 2018-03-28 DIAGNOSIS — Z923 Personal history of irradiation: Secondary | ICD-10-CM | POA: Insufficient documentation

## 2018-03-28 DIAGNOSIS — Z510 Encounter for antineoplastic radiation therapy: Secondary | ICD-10-CM | POA: Diagnosis not present

## 2018-03-28 DIAGNOSIS — Z85828 Personal history of other malignant neoplasm of skin: Secondary | ICD-10-CM | POA: Insufficient documentation

## 2018-03-28 DIAGNOSIS — Z801 Family history of malignant neoplasm of trachea, bronchus and lung: Secondary | ICD-10-CM | POA: Diagnosis not present

## 2018-03-28 DIAGNOSIS — Z79810 Long term (current) use of selective estrogen receptor modulators (SERMs): Secondary | ICD-10-CM | POA: Diagnosis not present

## 2018-03-28 DIAGNOSIS — C50112 Malignant neoplasm of central portion of left female breast: Secondary | ICD-10-CM | POA: Insufficient documentation

## 2018-03-28 DIAGNOSIS — Z170 Estrogen receptor positive status [ER+]: Secondary | ICD-10-CM | POA: Diagnosis not present

## 2018-03-28 DIAGNOSIS — Z803 Family history of malignant neoplasm of breast: Secondary | ICD-10-CM | POA: Insufficient documentation

## 2018-03-28 DIAGNOSIS — D0512 Intraductal carcinoma in situ of left breast: Secondary | ICD-10-CM

## 2018-03-28 LAB — CBC
HCT: 36 % (ref 35.0–47.0)
HEMOGLOBIN: 12.5 g/dL (ref 12.0–16.0)
MCH: 30.8 pg (ref 26.0–34.0)
MCHC: 34.7 g/dL (ref 32.0–36.0)
MCV: 88.6 fL (ref 80.0–100.0)
PLATELETS: 244 10*3/uL (ref 150–440)
RBC: 4.06 MIL/uL (ref 3.80–5.20)
RDW: 12.9 % (ref 11.5–14.5)
WBC: 6.7 10*3/uL (ref 3.6–11.0)

## 2018-03-29 ENCOUNTER — Ambulatory Visit
Admission: RE | Admit: 2018-03-29 | Discharge: 2018-03-29 | Disposition: A | Discharge status: Home / Self Care | Payer: 59 | Source: Ambulatory Visit | Attending: Radiation Oncology | Admitting: Radiation Oncology

## 2018-03-29 DIAGNOSIS — Z510 Encounter for antineoplastic radiation therapy: Secondary | ICD-10-CM | POA: Diagnosis not present

## 2018-03-30 ENCOUNTER — Ambulatory Visit
Admission: RE | Admit: 2018-03-30 | Discharge: 2018-03-30 | Disposition: A | Discharge status: Home / Self Care | Payer: 59 | Source: Ambulatory Visit | Attending: Radiation Oncology | Admitting: Radiation Oncology

## 2018-03-30 DIAGNOSIS — Z510 Encounter for antineoplastic radiation therapy: Secondary | ICD-10-CM | POA: Diagnosis not present

## 2018-03-31 ENCOUNTER — Ambulatory Visit
Admission: RE | Admit: 2018-03-31 | Discharge: 2018-03-31 | Disposition: A | Discharge status: Home / Self Care | Payer: 59 | Source: Ambulatory Visit | Attending: Radiation Oncology | Admitting: Radiation Oncology

## 2018-03-31 DIAGNOSIS — D0512 Intraductal carcinoma in situ of left breast: Secondary | ICD-10-CM | POA: Diagnosis not present

## 2018-03-31 DIAGNOSIS — Z510 Encounter for antineoplastic radiation therapy: Secondary | ICD-10-CM | POA: Diagnosis not present

## 2018-03-31 DIAGNOSIS — Z170 Estrogen receptor positive status [ER+]: Secondary | ICD-10-CM | POA: Diagnosis not present

## 2018-04-01 ENCOUNTER — Ambulatory Visit
Admission: RE | Admit: 2018-04-01 | Discharge: 2018-04-01 | Disposition: A | Discharge status: Home / Self Care | Payer: 59 | Source: Ambulatory Visit | Attending: Radiation Oncology | Admitting: Radiation Oncology

## 2018-04-01 DIAGNOSIS — Z510 Encounter for antineoplastic radiation therapy: Secondary | ICD-10-CM | POA: Diagnosis not present

## 2018-04-02 NOTE — Progress Notes (Signed)
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  INTERVAL HISTORY: Patient returns to clinic today for further evaluation and initiation of tamoxifen.  She continues with daily XRT completing her treatment later this week on April 06, 2018.  She has some mild skin irritation, but otherwise is tolerating treatments well. She has no neurologic complaints.  She denies any recent fevers or illnesses.  She has a good appetite and denies weight loss.  She denies any pain.  She has no chest pain or shortness of breath.  She denies any nausea, vomiting, constipation, or diarrhea.  She has no urinary complaints.  Patient offers no specific complaints today.  REVIEW OF SYSTEMS:   Review of Systems  Constitutional: Negative.  Negative for fever, malaise/fatigue and weight loss.  Respiratory: Negative.  Negative for cough and shortness of breath.   Cardiovascular: Negative.  Negative for chest pain and leg swelling.  Gastrointestinal: Negative.  Negative for abdominal pain.  Genitourinary: Negative.  Negative for dysuria.  Musculoskeletal: Negative.  Negative for back pain.  Skin: Negative.  Negative for rash.  Neurological: Negative.  Negative for sensory change, focal weakness and weakness.  Psychiatric/Behavioral: Negative.  The patient is not nervous/anxious.     As per HPI. Otherwise, a complete review of systems is negative.  PAST MEDICAL HISTORY: Past Medical History:  Diagnosis Date  . Acute right flank pain 01/21/2011  . Back pain 01/21/2011  . Cancer of central portion of left female breast ([place]) 10/23/2017   also skin cancer on calf(5 yrs ago) and cheek (august 2018)  . Heart  murmur    found once many years ago  . 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 History of kidney stones    passed twice  . Personal history of other malignant neoplasm of skin 06/10/2017  . Viral warts, unspecified     PAST SURGICAL HISTORY: Past Surgical History:  Procedure Laterality Date  . APPENDECTOMY  1983  . AXILLARY SENTINEL NODE BIOPSY Left 11/17/2017   Procedure: AXILLARY SENTINEL NODE BIOPSY;  Surgeon: Olean Ree, MD;  Location: [facility] ORS;  Service: General;  Laterality: Left;  . BREAST LUMPECTOMY WITH NEEDLE LOCALIZATION Left 11/17/2017   Procedure: BREAST LUMPECTOMY WITH NEEDLE LOCALIZATION;  Surgeon: Olean Ree, MD;  Location: [facility] ORS;  Service: General;  Laterality: Left;  . DILATION AND CURETTAGE OF UTERUS     x4  . MOHS SURGERY  2018  . WISDOM TOOTH EXTRACTION      FAMILY HISTORY: Family History  Problem Relation Age of Onset  . Breast cancer Mother 59       currently 42  . Hypertension Father   . Lung cancer Father 38       smoker; deceased 59  . Breast cancer Other        mat grandmother's sister    ADVANCED DIRECTIVES (Y/N):  N  HEALTH MAINTENANCE: Social History   Tobacco Use  . Smoking status: Never Smoker  . Smokeless tobacco: Never Used  Substance Use Topics  . Alcohol use: Yes    Comment: Socially on weekends  . Drug use: No     Colonoscopy:  PAP:  Bone density:  Lipid panel:  Allergies  Allergen Reactions  . Blue Dyes (Parenteral) Dermatitis  . Penicillins Rash and Other (See Comments)    Has patient had a PCN reaction causing immediate rash, facial/tongue/throat swelling, SOB or lightheadedness with hypotension: Unknown Has patient had a PCN reaction causing severe rash involving mucus membranes or skin necrosis: No Has patient had a PCN reaction that required hospitalization: No Has patient had a PCN reaction occurring within the last 10 years: No If all of the above answers are "NO", then may proceed  with Cephalosporin use.     Current Outpatient Medications  Medication Sig Dispense Refill  . Ascorbic Acid (VITAMIN C) 500 MG tablet Take 500 mg by mouth daily.      . Calcium Carbonate-Vitamin D (CALCIUM 600+D) 600-400 MG-UNIT per tablet Take 1 tablet by mouth daily.      . Glucosamine 500 MG CAPS Take 500 mg by mouth daily.     . Multiple Vitamin (MULTIVITAMIN) tablet Take 1 tablet by mouth daily.      . vitamin B-12 (CYANOCOBALAMIN) 100 MCG tablet Take 100 mcg by mouth daily.    . tamoxifen (NOLVADEX) 20 MG tablet Take 1 tablet (20 mg total) by mouth daily. 90 tablet 3   No current facility-administered medications for this visit.     OBJECTIVE: Vitals:   04/04/18 1434 04/04/18 1438  BP:  130/80  Pulse:  71  Resp: 12   Temp:  97.8 F (36.6 C)     Body mass index is 26.5 kg/m.    ECOG FS:0 - Asymptomatic  General: Well-developed, well-nourished, no acute distress. Eyes: Pink conjunctiva, anicteric sclera. Breast: Mild erythema on left breast. Lungs: Clear to auscultation bilaterally. Heart: Regular rate and rhythm. No rubs, murmurs, or gallops. Abdomen: Soft, nontender, nondistended. No organomegaly noted, normoactive bowel sounds. Musculoskeletal: No edema, cyanosis, or clubbing. Neuro: Alert, answering all questions appropriately. Cranial nerves grossly intact. Skin: No rashes or petechiae noted. Psych: Normal affect.   LAB RESULTS:  No results found for: NA, K, CL, CO2, GLUCOSE, BUN, CREATININE, CALCIUM, PROT, ALBUMIN, AST, ALT, ALKPHOS, BILITOT, GFRNONAA, GFRAA  Lab Results  Component Value Date   WBC 6.7 03/28/2018   HGB 12.5 03/28/2018   HCT 36.0 03/28/2018   MCV 88.6 03/28/2018   PLT 244 03/28/2018     STUDIES: No results found.  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  Oncotype DX score 16, which is considered low risk.   PLAN:    1. Clinical stage IA ER/PR  positive, HER-2 negative invasive carcinoma of the central portion of the left breast: Patient had lumpectomy on November 17, 2017 confirming stage of disease.  She had difficulty with wound healing possibly secondary to her methylene blue injection for sentinel node biopsy.  She did not require adjuvant chemotherapy given her low risk Oncotype.  Patient completed adjuvant XRT on April 06, 2018.  Patient confirms that she is still premenopausal and was given a prescription for tamoxifen today.  She will take this for a total of 5 years completing in June 2024.  Return to clinic in 3 months for routine evaluation.   2.  Genetic testing: Appreciate genetic counseling input.  Patient was noted to have a variant of unknown significance.  Approximately 30 minutes was spent in discussion of which greater than 50% was consultation.     Patient expressed understanding and was in agreement with this plan. She also understands that She can call  clinic at any time with any questions, concerns, or complaints.   Cancer Staging Malignant neoplasm of left breast in female, estrogen receptor positive ([place]) Staging form: Breast, AJCC 8th Edition - Clinical stage from 10/23/2017: Stage IA (cT1a, cN0, cM0, G2, ER: Positive, PR: Positive, HER2: Negative) - Signed by Lloyd Huger, MD on 12/09/2017   Lloyd Huger, MD   04/07/2018 12:30 AM

## 2018-04-04 ENCOUNTER — Other Ambulatory Visit: Payer: Self-pay

## 2018-04-04 ENCOUNTER — Inpatient Hospital Stay (HOSPITAL_BASED_OUTPATIENT_CLINIC_OR_DEPARTMENT_OTHER): Payer: 59 | Admitting: Oncology

## 2018-04-04 ENCOUNTER — Encounter: Payer: Self-pay | Admitting: Oncology

## 2018-04-04 ENCOUNTER — Ambulatory Visit
Admission: RE | Admit: 2018-04-04 | Discharge: 2018-04-04 | Disposition: A | Discharge status: Home / Self Care | Payer: 59 | Source: Ambulatory Visit | Attending: Radiation Oncology | Admitting: Radiation Oncology

## 2018-04-04 VITALS — BP 130/80 | HR 71 | Temp 97.8°F | Resp 12 | Ht 64.0 in | Wt 154.4 lb

## 2018-04-04 DIAGNOSIS — Z79810 Long term (current) use of selective estrogen receptor modulators (SERMs): Secondary | ICD-10-CM

## 2018-04-04 DIAGNOSIS — C50112 Malignant neoplasm of central portion of left female breast: Secondary | ICD-10-CM

## 2018-04-04 DIAGNOSIS — Z510 Encounter for antineoplastic radiation therapy: Secondary | ICD-10-CM | POA: Diagnosis not present

## 2018-04-04 DIAGNOSIS — Z923 Personal history of irradiation: Secondary | ICD-10-CM

## 2018-04-04 DIAGNOSIS — Z170 Estrogen receptor positive status [ER+]: Secondary | ICD-10-CM

## 2018-04-04 MED ORDER — TAMOXIFEN CITRATE 20 MG PO TABS: 20.0000 mg | ORAL_TABLET | Freq: Every day | ORAL | 3 refills | Status: DC

## 2018-04-04 NOTE — Progress Notes (Signed)
Patient here for follow up. She has radiation rash to the effected side. Currently using silvadene cream.

## 2018-04-05 ENCOUNTER — Ambulatory Visit
Admission: RE | Admit: 2018-04-05 | Discharge: 2018-04-05 | Disposition: A | Discharge status: Home / Self Care | Payer: 59 | Source: Ambulatory Visit | Attending: Radiation Oncology | Admitting: Radiation Oncology

## 2018-04-05 ENCOUNTER — Ambulatory Visit: Payer: 59

## 2018-04-05 DIAGNOSIS — Z510 Encounter for antineoplastic radiation therapy: Secondary | ICD-10-CM | POA: Diagnosis not present

## 2018-04-06 ENCOUNTER — Ambulatory Visit
Admission: RE | Admit: 2018-04-06 | Discharge: 2018-04-06 | Disposition: A | Discharge status: Home / Self Care | Payer: 59 | Source: Ambulatory Visit | Attending: Radiation Oncology | Admitting: Radiation Oncology

## 2018-04-06 DIAGNOSIS — D0512 Intraductal carcinoma in situ of left breast: Secondary | ICD-10-CM | POA: Diagnosis not present

## 2018-04-06 DIAGNOSIS — Z170 Estrogen receptor positive status [ER+]: Secondary | ICD-10-CM | POA: Diagnosis not present

## 2018-04-06 DIAGNOSIS — Z510 Encounter for antineoplastic radiation therapy: Secondary | ICD-10-CM | POA: Diagnosis not present

## 2018-04-11 IMAGING — MG US BREAST BX W LOC DEV 1ST LESION IMG BX SPEC US GUIDE*L*
1 series · 8 of 8 positions shown · non-contrast
Comparison: Previous exam(s).

ADDENDUM:
Pathology of the left breast biopsy revealed A. BREAST, LEFT [DATE];
ULTRASOUND GUIDED BIOPSY: INVASIVE MAMMARY CARCINOMA, NO SPECIAL
TYPE. Size of invasive carcinoma: 5 mm in this sample. Histologic
grade of invasive carcinoma: Overall grade: 2. Ductal carcinoma in
situ: Present, nuclear grade 2, negative for necrosis.
Lymphovascular invasion: Not identified. Comment: The definitive
grade will be assigned on the excisional specimen. These findings
were communicated to Amasya in Dr. [REDACTED] on
10/20/2017. Read back procedure was performed.

This was found to be concordant with Dr. Kaneuchi impression and
notes.
Recommendation:  Surgical and oncology referrals.
Results were given to the patient by Dr. Kaneuchi during a scheduled
office visit on 10/20/17. Dr. Murat-Durus requested that the nurse
navigators for [HOSPITAL] make the referral
appointments. The patient was contacted by phone by Murat-Durus on 10/20/17 for a post biopsy site check. The patient stated she
did well following the biopsy. Post biopsy instructions were
reviewed with the patient. Request for referrals was relayed to Montano, Isael
Mamode Ally, RN, nurse navigator for [HOSPITAL].
She contacted the patient on 10/21/17 and made appointments for the
patient with Dr. Magicwood, oncologist for 10/25/17 at [DATE] and
Dr. Amree, surgeon for 11/03/16 at [DATE]. The patient has been
notified of the appointments. She was encouraged to contact the
[HOSPITAL] or [facility] with any further
questions or concerns.
Addendum by Ranger, Noorhaslinda on 10/22/17.
CLINICAL DATA: Left breast 12 o'clock mass/distortion.
EXAM:
ULTRASOUND GUIDED LEFT BREAST CORE NEEDLE BIOPSY

[Series 1: MG view · 0.06mm/px · 8 of 14 slices shown]
[im 1/14]
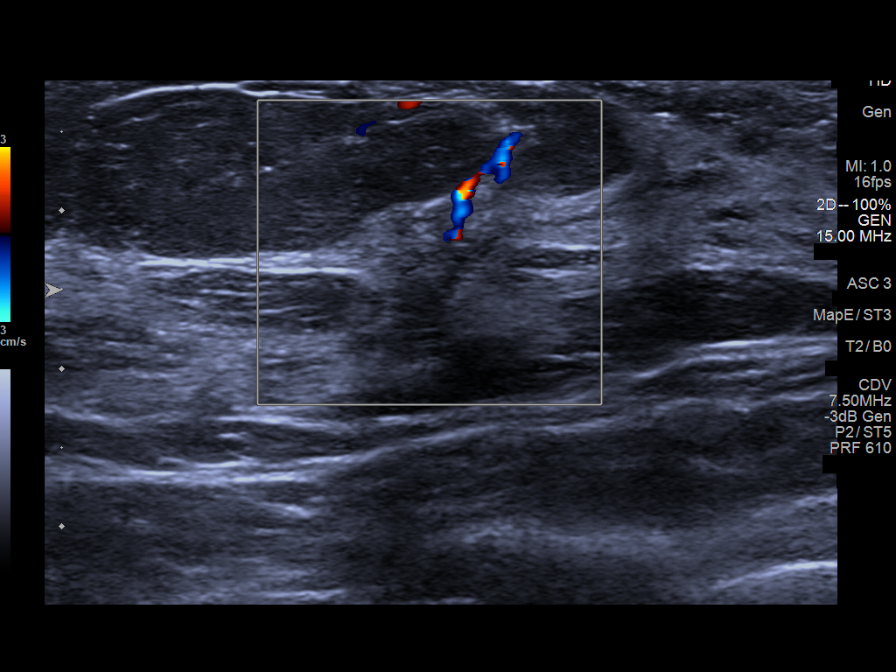
[im 2/14]
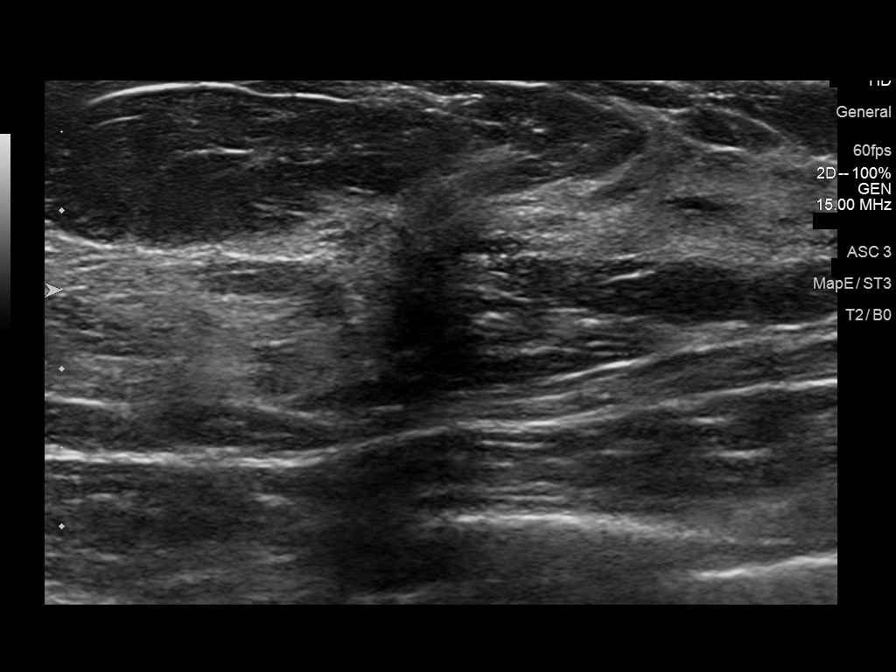
[im 4/14]
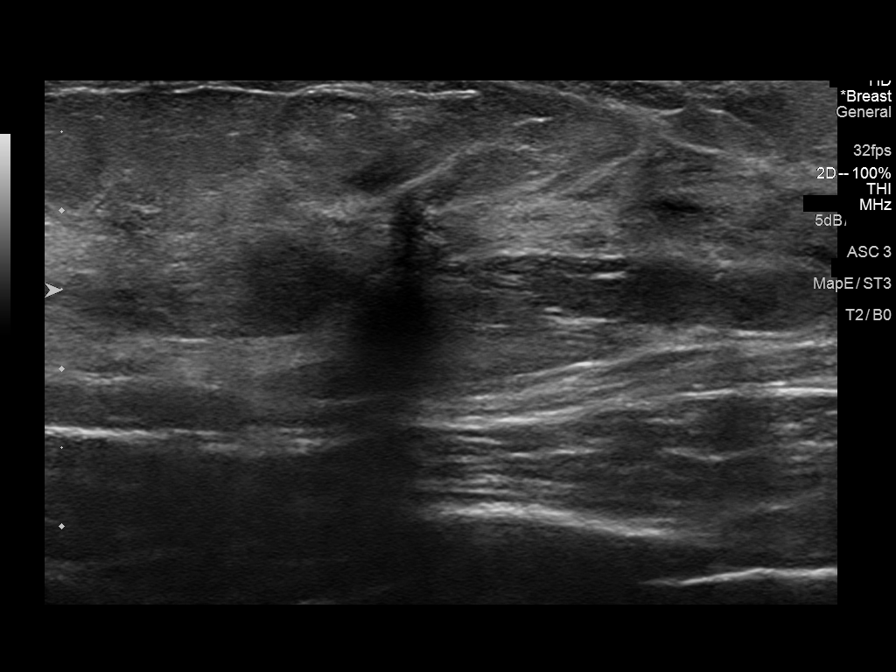
[im 6/14]
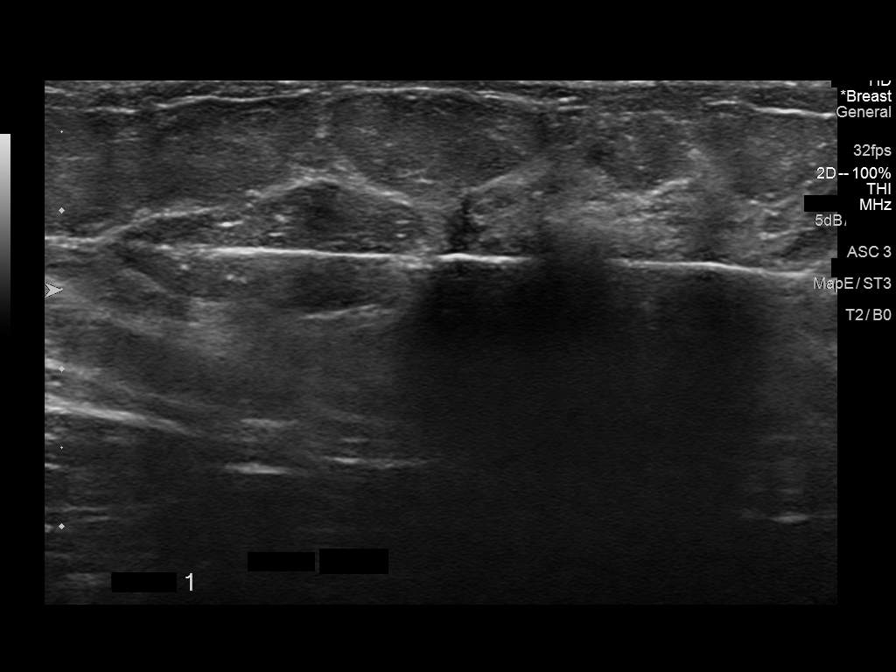
[im 8/14]
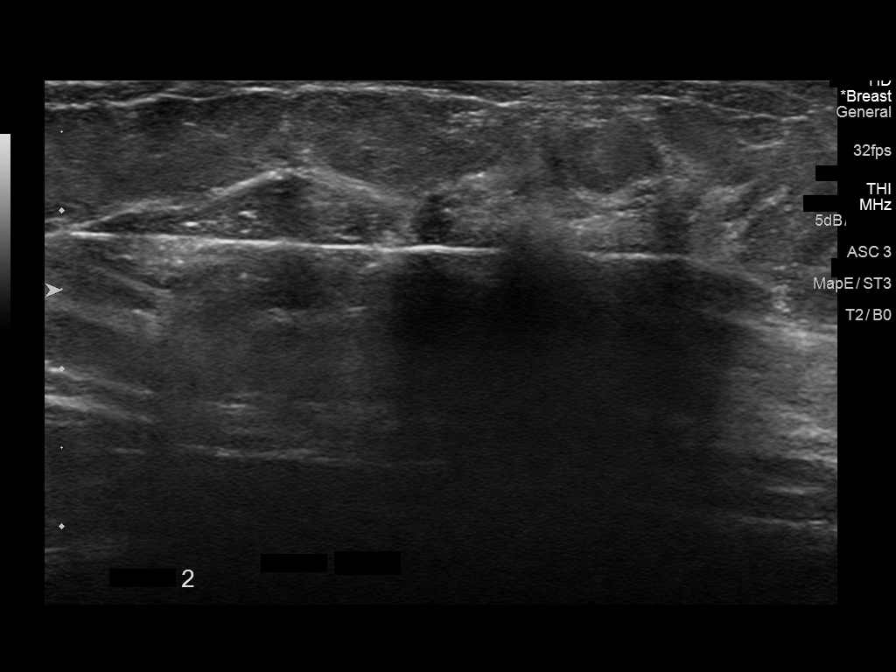
[im 10/14]
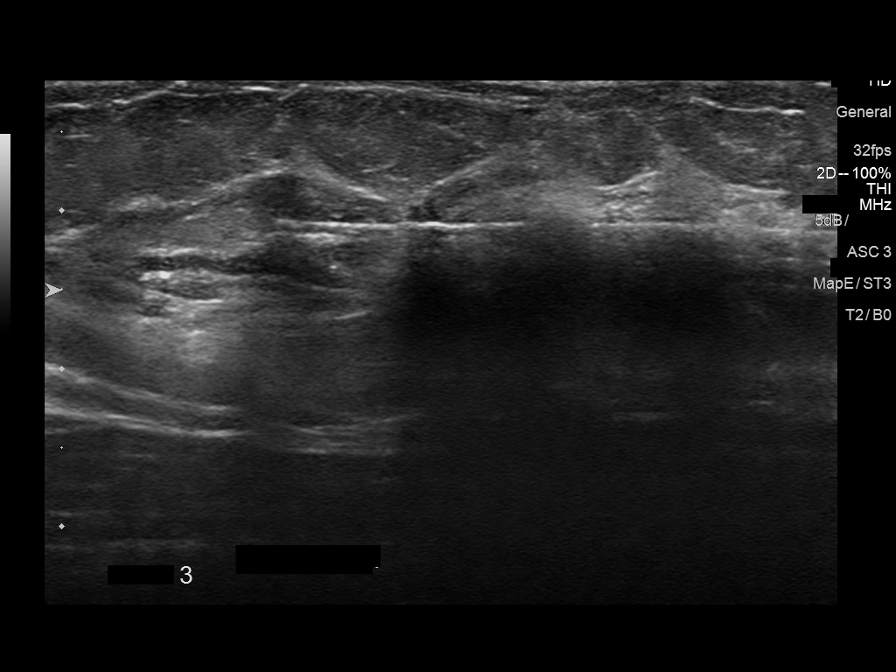
[im 12/14]
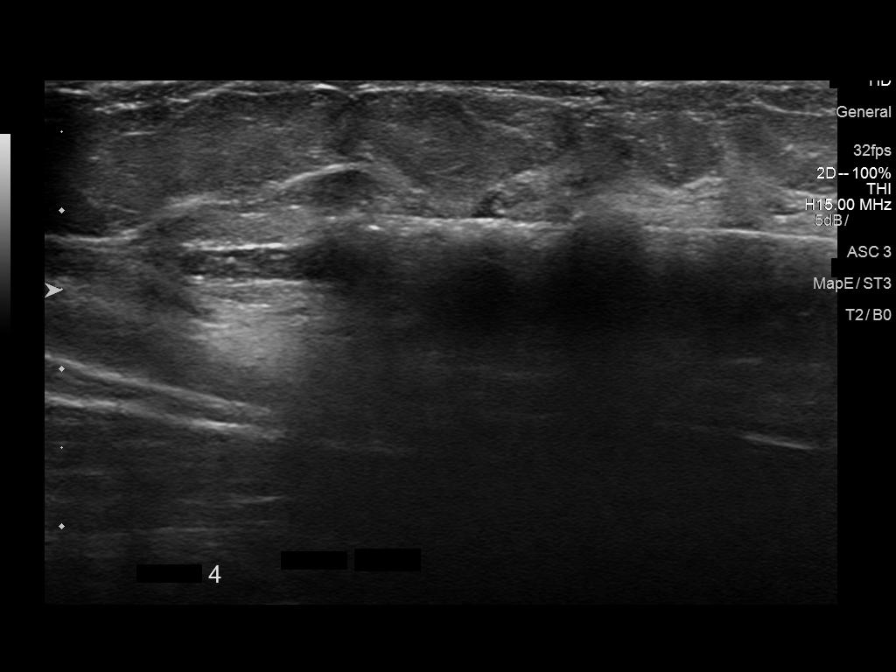
[im 14/14]
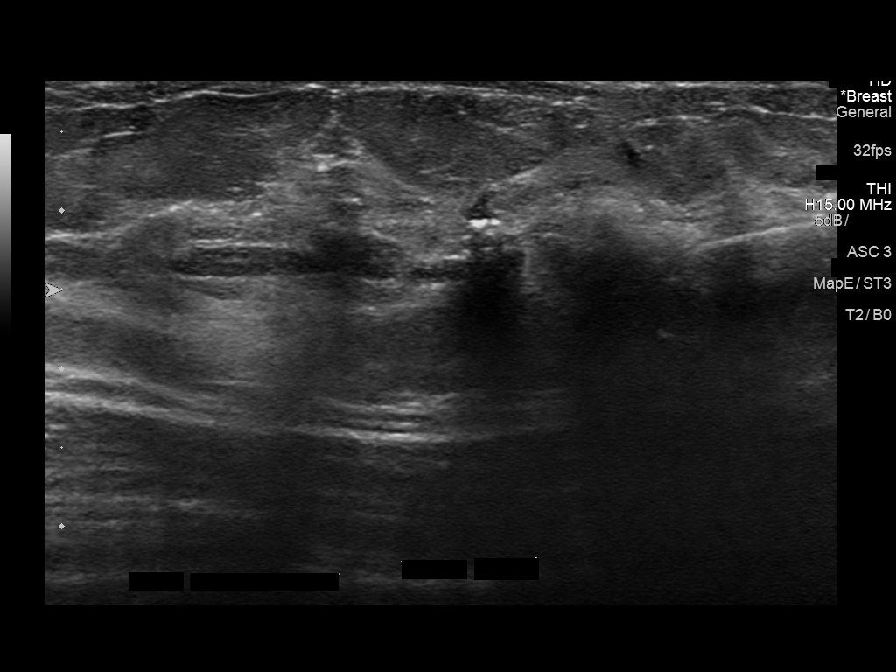

[8 of 8 positions shown; findings below may reference images not displayed]



Lesion quadrant: Upper inner quadrant

Using sterile technique and 1% Lidocaine as local anesthetic, under
direct ultrasound visualization, a 14 gauge Ranger, Noorhaslinda device was
used to perform biopsy of left breast 12 o'clock distortion/nodule
using a lateral approach. At the conclusion of the procedure a wing
shaped tissue marker clip was deployed into the biopsy cavity.
Follow up 2 view mammogram was performed and dictated separately.
IMPRESSION: Ultrasound guided biopsy of left breast.  No apparent complications.

## 2018-05-12 DIAGNOSIS — C44319 Basal cell carcinoma of skin of other parts of face: Secondary | ICD-10-CM | POA: Diagnosis not present

## 2018-05-25 ENCOUNTER — Ambulatory Visit
Admission: RE | Admit: 2018-05-25 | Discharge: 2018-05-25 | Disposition: A | Discharge status: Home / Self Care | Payer: 59 | Source: Ambulatory Visit | Attending: Radiation Oncology | Admitting: Radiation Oncology

## 2018-05-25 ENCOUNTER — Other Ambulatory Visit: Payer: Self-pay

## 2018-05-25 ENCOUNTER — Encounter: Payer: Self-pay | Admitting: Radiation Oncology

## 2018-05-25 VITALS — BP 118/73 | HR 70 | Temp 96.4°F | Resp 18 | Wt 153.0 lb

## 2018-05-25 DIAGNOSIS — Z170 Estrogen receptor positive status [ER+]: Secondary | ICD-10-CM | POA: Diagnosis not present

## 2018-05-25 DIAGNOSIS — D0512 Intraductal carcinoma in situ of left breast: Secondary | ICD-10-CM | POA: Diagnosis not present

## 2018-05-25 DIAGNOSIS — Z79810 Long term (current) use of selective estrogen receptor modulators (SERMs): Secondary | ICD-10-CM | POA: Insufficient documentation

## 2018-05-25 DIAGNOSIS — Z923 Personal history of irradiation: Secondary | ICD-10-CM | POA: Insufficient documentation

## 2018-05-25 NOTE — Progress Notes (Signed)
Radiation Oncology Follow up Note  Name: Holly Bennett   Date:   05/25/2018 MRN:  [id number] DOB: [birth year]    This 52 y.o. female presents to the clinic today for one-month follow-up status post whole breast radiation to her left breast for stage I invasive mammary carcinoma.  REFERRING PROVIDER: Princess Bruins, MD  HPI: patient is a 52 year old female now seen out 1 month having completed whole breast radiation to her left breast for stage I.invasive mammary carcinoma ER/PR positive HER-2/neu negative. She had some healing issues with her scar although that completely resolved. She seen today one month out and is doing well. She specifically denies breast tenderness cough or bone pain.she's been started on tamoxifen and is tolerating that well without side effect.  COMPLICATIONS OF TREATMENT: none  FOLLOW UP COMPLIANCE: keeps appointments   PHYSICAL EXAM:  BP 118/73   Pulse 70   Temp (!) 96.4 F (35.8 C)   Resp 18   Wt 153 lb (69.4 kg)   BMI 26.26 kg/m  Lungs are clear to A&P cardiac examination essentially unremarkable with regular rate and rhythm. No dominant mass or nodularity is noted in either breast in 2 positions examined. Incision is well-healed. No axillary or supraclavicular adenopathy is appreciated. Cosmetic result is excellent.area of previous wound healing is completely resolved with excellent cosmetic result.Well-developed well-nourished patient in NAD. HEENT reveals PERLA, EOMI, discs not visualized.  Oral cavity is clear. No oral mucosal lesions are identified. Neck is clear without evidence of cervical or supraclavicular adenopathy. Lungs are clear to A&P. Cardiac examination is essentially unremarkable with regular rate and rhythm without murmur rub or thrill. Abdomen is benign with no organomegaly or masses noted. Motor sensory and DTR levels are equal and symmetric in the upper and lower extremities. Cranial nerves II through XII are grossly intact. Proprioception  is intact. No peripheral adenopathy or edema is identified. No motor or sensory levels are noted. Crude visual fields are within normal range.  RADIOLOGY RESULTS: no current films for review  PLAN: present time she is doing well with excellent cosmetic result status post whole breast radiation. I'm please were overall progress. I've asked to see her back in 4-5 months for follow-up. She continues close follow-up care with medical oncology. She continues on tamoxifen without side effect.  I would like to take this opportunity to thank you for allowing me to participate in the care of your patient.Noreene Filbert, MD

## 2018-06-09 ENCOUNTER — Telehealth: Payer: Self-pay | Admitting: *Deleted

## 2018-06-09 DIAGNOSIS — Z1211 Encounter for screening for malignant neoplasm of colon: Secondary | ICD-10-CM

## 2018-06-09 NOTE — Telephone Encounter (Signed)
Patient called requesting colonoscopy referral placed at [place] , referral enter I spoke with patient and told her she can call now to schedule or wait for a call from that office.

## 2018-06-10 ENCOUNTER — Other Ambulatory Visit: Payer: Self-pay

## 2018-06-10 DIAGNOSIS — Z1211 Encounter for screening for malignant neoplasm of colon: Secondary | ICD-10-CM

## 2018-07-01 ENCOUNTER — Encounter: Admission: RE | Payer: Self-pay | Source: Ambulatory Visit

## 2018-07-01 ENCOUNTER — Ambulatory Visit: Admission: RE | Admit: 2018-07-01 | Payer: 59 | Source: Ambulatory Visit | Admitting: Gastroenterology

## 2018-07-01 SURGERY — COLONOSCOPY WITH PROPOFOL
Anesthesia: General

## 2018-07-05 ENCOUNTER — Telehealth: Payer: Self-pay | Admitting: *Deleted

## 2018-07-05 NOTE — Telephone Encounter (Signed)
Patient called and scheduled her colonoscopy, was not able to proceed with colonoscopy because the prep made her very sick had severe vomiting. Asked if she could have colonguard ( which is a kit with samples taken at home)  ordered states her insurance company told her it would be approved if ordered by MD. Please advise

## 2018-07-08 ENCOUNTER — Telehealth: Payer: Self-pay

## 2018-07-08 NOTE — Telephone Encounter (Signed)
Left message at this time per request of Dr.Piscoya. He would like to know if  Patient is  following up with a surgeon as to having  breast exams and mammograms.

## 2018-07-10 NOTE — Progress Notes (Signed)
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  INTERVAL HISTORY: Patient returns to clinic today for further evaluation and assess her toleration of tamoxifen.  She reports she initially had hot flashes, but these have now dissipated and are now infrequent.  She currently feels well and is asymptomatic. She has no neurologic complaints.  She denies any recent fevers or illnesses.  She has a good appetite and denies weight loss.  She denies any pain.  She has no chest pain or shortness of breath.  She denies any nausea, vomiting, constipation, or diarrhea.  She has no urinary complaints.  Patient feels at her baseline and offers no specific complaints today.  REVIEW OF SYSTEMS:   Review of Systems  Constitutional: Negative.  Negative for fever, malaise/fatigue and weight loss.  Respiratory: Negative.  Negative for cough and shortness of breath.   Cardiovascular: Negative.  Negative for chest pain and leg swelling.  Gastrointestinal: Negative.  Negative for abdominal pain.  Genitourinary: Negative.  Negative for dysuria.  Musculoskeletal: Negative.  Negative for back pain.  Skin: Negative.  Negative for rash.  Neurological: Negative.  Negative for sensory change, focal weakness and weakness.  Psychiatric/Behavioral: Negative.  The patient is not nervous/anxious.     As per HPI. Otherwise, a complete review of systems is negative.  PAST MEDICAL HISTORY: Past Medical History:  Diagnosis Date  . Acute right flank pain 01/21/2011  . Back pain 01/21/2011  . Cancer of central portion of left female breast ([place]) 10/23/2017   also skin cancer on calf(5 yrs ago) and cheek (august 2018)    . Heart murmur    found once many years ago  . 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 History of kidney stones    passed twice  . Personal history of other malignant neoplasm of skin 06/10/2017  . Viral warts, unspecified     PAST SURGICAL HISTORY: Past Surgical History:  Procedure Laterality Date  . APPENDECTOMY  1983  . AXILLARY SENTINEL NODE BIOPSY Left 11/17/2017   Procedure: AXILLARY SENTINEL NODE BIOPSY;  Surgeon: Olean Ree, MD;  Location: [facility] ORS;  Service: General;  Laterality: Left;  . BREAST LUMPECTOMY WITH NEEDLE LOCALIZATION Left 11/17/2017   Procedure: BREAST LUMPECTOMY WITH NEEDLE LOCALIZATION;  Surgeon: Olean Ree, MD;  Location: [facility] ORS;  Service: General;  Laterality: Left;  . DILATION AND CURETTAGE OF UTERUS     x4  . MOHS SURGERY  2018  . WISDOM TOOTH EXTRACTION      FAMILY HISTORY: Family History  Problem Relation Age of Onset  . Breast cancer Mother 40       currently 32  . Hypertension Father   . Lung cancer Father 51       smoker; deceased 56  . Breast cancer Other        mat grandmother's sister    ADVANCED DIRECTIVES (Y/N):  N  HEALTH MAINTENANCE: Social History   Tobacco Use  . Smoking status: Never Smoker  . Smokeless tobacco: Never Used  Substance Use Topics  . Alcohol use: Yes    Comment: Socially on weekends  . Drug use: No     Colonoscopy:  PAP:  Bone density:  Lipid panel:  Allergies  Allergen Reactions  . Blue Dyes (Parenteral) Dermatitis  . Penicillins Rash and Other (See Comments)    Has patient had a PCN reaction causing immediate rash, facial/tongue/throat swelling, SOB or lightheadedness with hypotension: Unknown Has patient had a PCN reaction causing severe rash involving mucus membranes or skin necrosis: No Has patient had a PCN reaction that required hospitalization: No Has patient had a PCN reaction occurring within the last 10 years: No If all of the above answers are "NO", then may  proceed with Cephalosporin use.     Current Outpatient Medications  Medication Sig Dispense Refill  . Ascorbic Acid (VITAMIN C) 500 MG tablet Take 500 mg by mouth daily.      . Calcium Carbonate-Vitamin D (CALCIUM 600+D) 600-400 MG-UNIT per tablet Take 1 tablet by mouth daily.      . Collagen Hydrolysate POWD by Does not apply route.    . Glucosamine 500 MG CAPS Take 500 mg by mouth daily.     . Multiple Vitamin (MULTIVITAMIN) tablet Take 1 tablet by mouth daily.      . tamoxifen (NOLVADEX) 20 MG tablet Take 1 tablet (20 mg total) by mouth daily. 90 tablet 3  . vitamin B-12 (CYANOCOBALAMIN) 100 MCG tablet Take 100 mcg by mouth daily.     No current facility-administered medications for this visit.     OBJECTIVE: Vitals:   07/14/18 1539  BP: 129/86  Pulse: 69  Resp: 18  Temp: (!) 97.4 F (36.3 C)     Body mass index is 26.44 kg/m.    ECOG FS:0 - Asymptomatic  General: Well-developed, well-nourished, no acute distress. Eyes: Pink conjunctiva, anicteric sclera. HEENT: Normocephalic, moist mucous membranes. Breast: Patient reports a normal exam by another provider recently. Lungs: Clear to auscultation bilaterally. Heart: Regular rate and rhythm. No rubs, murmurs, or gallops. Abdomen: Soft, nontender, nondistended. No organomegaly noted, normoactive bowel sounds. Musculoskeletal: No edema, cyanosis, or clubbing. Neuro: Alert, answering all questions appropriately. Cranial nerves grossly intact. Skin: No rashes or petechiae noted. Psych: Normal affect.   LAB RESULTS:  No results found for: NA, K, CL, CO2, GLUCOSE, BUN, CREATININE, CALCIUM, PROT, ALBUMIN, AST, ALT, ALKPHOS, BILITOT, GFRNONAA, GFRAA  Lab Results  Component Value Date   WBC 6.7 03/28/2018   HGB 12.5 03/28/2018   HCT 36.0 03/28/2018   MCV 88.6 03/28/2018   PLT 244 03/28/2018     STUDIES: No results found.  ASSESSMENT: Clinical stage IA ER/PR positive, HER-2 negative invasive carcinoma of the central  portion of the left breast.  Oncotype DX score 16, which is considered low risk.   PLAN:    1. 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  She had difficulty with wound healing possibly secondary to her methylene blue injection for sentinel node biopsy.  She did not require adjuvant chemotherapy given her low risk Oncotype.  Patient completed adjuvant XRT on April 06, 2018.  Patient confirms that she is still premenopausal, although may be entering menopause now.  Continue tamoxifen for a total of 5 years completing treatment in June 2024.  Can consider switching to an aromatase inhibitor if patient transitions to menopause.  She will require a mammogram in January 2020.  Return to clinic in 6 months for routine evaluation.  2.  Genetic testing: Appreciate genetic counseling input.  Patient was noted to have a variant  of unknown significance.  I spent a total of 20 minutes face-to-face with the patient of which greater than 50% of the visit was spent in counseling and coordination of care as detailed above.  Patient expressed understanding and was in agreement with this plan. She also understands that She can call clinic at any time with any questions, concerns, or complaints.   Cancer Staging Malignant neoplasm of left breast in female, estrogen receptor positive ([place]) Staging form: Breast, AJCC 8th Edition - Clinical stage from 10/23/2017: Stage IA (cT1a, cN0, cM0, G2, ER: Positive, PR: Positive, HER2: Negative) - Signed by Lloyd Huger, MD on 12/09/2017   Lloyd Huger, MD   07/14/2018 4:09 PM

## 2018-07-11 ENCOUNTER — Ambulatory Visit: Payer: 59 | Admitting: Oncology

## 2018-07-13 NOTE — Telephone Encounter (Signed)
Did she ask the [employer] for next step?  Agree with [employer] if ok by [employer].

## 2018-07-13 NOTE — Telephone Encounter (Signed)
Left detailed message on cell, told her to call me with answer

## 2018-07-14 ENCOUNTER — Inpatient Hospital Stay: Payer: 59 | Attending: Oncology | Admitting: Oncology

## 2018-07-14 ENCOUNTER — Encounter: Payer: Self-pay | Admitting: Oncology

## 2018-07-14 VITALS — BP 129/86 | HR 69 | Temp 97.4°F | Resp 18 | Wt 154.1 lb

## 2018-07-14 DIAGNOSIS — C50112 Malignant neoplasm of central portion of left female breast: Secondary | ICD-10-CM | POA: Diagnosis not present

## 2018-07-14 DIAGNOSIS — Z170 Estrogen receptor positive status [ER+]: Secondary | ICD-10-CM | POA: Diagnosis not present

## 2018-07-14 DIAGNOSIS — Z79810 Long term (current) use of selective estrogen receptor modulators (SERMs): Secondary | ICD-10-CM | POA: Insufficient documentation

## 2018-07-14 DIAGNOSIS — Z923 Personal history of irradiation: Secondary | ICD-10-CM | POA: Diagnosis not present

## 2018-07-14 NOTE — Progress Notes (Signed)
Pt in for 3 month check up, denies any concerns today.

## 2018-07-15 NOTE — Telephone Encounter (Signed)
Patient checked with GI MD and approved if the colonguard comes back abnormal will need to have colonoscopy. She told me to print form online fill out and fax to number provided on from [phone]. This was done patient aware, she will hear from the company.

## 2018-07-18 ENCOUNTER — Telehealth: Payer: Self-pay | Admitting: *Deleted

## 2018-07-18 NOTE — Telephone Encounter (Signed)
Patient has annual exam scheduled on 10/21/18, would like to have screening labs done prior to annual exam. Please advise

## 2018-07-18 NOTE — Telephone Encounter (Signed)
With pleasure!  CBC, CMP, TSH, FLP, Vit D.

## 2018-07-18 NOTE — Telephone Encounter (Signed)
Left message for patient to call.

## 2018-07-19 NOTE — Telephone Encounter (Signed)
Patient called back and give me fax # to [facility] [phone], order faxed patient will call to schedule.

## 2018-07-26 DIAGNOSIS — Z1212 Encounter for screening for malignant neoplasm of rectum: Secondary | ICD-10-CM | POA: Diagnosis not present

## 2018-07-26 DIAGNOSIS — Z1211 Encounter for screening for malignant neoplasm of colon: Secondary | ICD-10-CM | POA: Diagnosis not present

## 2018-07-27 ENCOUNTER — Ambulatory Visit (INDEPENDENT_AMBULATORY_CARE_PROVIDER_SITE_OTHER): Payer: 59 | Admitting: Psychology

## 2018-07-27 DIAGNOSIS — F411 Generalized anxiety disorder: Secondary | ICD-10-CM | POA: Diagnosis not present

## 2018-08-01 LAB — COLOGUARD: Cologuard: POSITIVE

## 2018-08-04 NOTE — Telephone Encounter (Signed)
colonoguard report came back positive from [employer]  Detailed message left on cell the need to follow-up with GI for colonoscopy

## 2018-08-12 ENCOUNTER — Ambulatory Visit (INDEPENDENT_AMBULATORY_CARE_PROVIDER_SITE_OTHER): Payer: 59 | Admitting: Psychology

## 2018-08-12 DIAGNOSIS — F411 Generalized anxiety disorder: Secondary | ICD-10-CM

## 2018-08-23 DIAGNOSIS — L820 Inflamed seborrheic keratosis: Secondary | ICD-10-CM | POA: Diagnosis not present

## 2018-08-23 DIAGNOSIS — D225 Melanocytic nevi of trunk: Secondary | ICD-10-CM | POA: Diagnosis not present

## 2018-08-23 DIAGNOSIS — D485 Neoplasm of uncertain behavior of skin: Secondary | ICD-10-CM | POA: Diagnosis not present

## 2018-08-23 DIAGNOSIS — D1800 Hemangioma unspecified site: Secondary | ICD-10-CM | POA: Diagnosis not present

## 2018-08-23 DIAGNOSIS — Z85828 Personal history of other malignant neoplasm of skin: Secondary | ICD-10-CM | POA: Diagnosis not present

## 2018-08-23 DIAGNOSIS — Z1283 Encounter for screening for malignant neoplasm of skin: Secondary | ICD-10-CM | POA: Diagnosis not present

## 2018-08-23 DIAGNOSIS — D1801 Hemangioma of skin and subcutaneous tissue: Secondary | ICD-10-CM | POA: Diagnosis not present

## 2018-08-25 ENCOUNTER — Ambulatory Visit (INDEPENDENT_AMBULATORY_CARE_PROVIDER_SITE_OTHER): Payer: 59 | Admitting: Psychology

## 2018-08-25 ENCOUNTER — Telehealth: Payer: Self-pay

## 2018-08-25 ENCOUNTER — Other Ambulatory Visit: Payer: Self-pay

## 2018-08-25 DIAGNOSIS — F411 Generalized anxiety disorder: Secondary | ICD-10-CM | POA: Diagnosis not present

## 2018-08-25 NOTE — Telephone Encounter (Signed)
Returned patients call leaving a voice message for her to call me back to reschedule her colonoscopy.  Thanks [employer]

## 2018-09-09 ENCOUNTER — Ambulatory Visit (INDEPENDENT_AMBULATORY_CARE_PROVIDER_SITE_OTHER): Payer: 59 | Admitting: Psychology

## 2018-09-09 DIAGNOSIS — F411 Generalized anxiety disorder: Secondary | ICD-10-CM | POA: Diagnosis not present

## 2018-09-27 ENCOUNTER — Ambulatory Visit (INDEPENDENT_AMBULATORY_CARE_PROVIDER_SITE_OTHER): Payer: 59 | Admitting: Psychology

## 2018-09-27 DIAGNOSIS — F411 Generalized anxiety disorder: Secondary | ICD-10-CM | POA: Diagnosis not present

## 2018-09-30 ENCOUNTER — Ambulatory Visit
Admission: RE | Admit: 2018-09-30 | Discharge: 2018-09-30 | Disposition: A | Discharge status: Home / Self Care | Payer: 59 | Source: Ambulatory Visit | Attending: Oncology | Admitting: Oncology

## 2018-09-30 DIAGNOSIS — Z170 Estrogen receptor positive status [ER+]: Principal | ICD-10-CM

## 2018-09-30 DIAGNOSIS — C50112 Malignant neoplasm of central portion of left female breast: Secondary | ICD-10-CM | POA: Diagnosis present

## 2018-09-30 DIAGNOSIS — Z853 Personal history of malignant neoplasm of breast: Secondary | ICD-10-CM | POA: Diagnosis not present

## 2018-09-30 DIAGNOSIS — R922 Inconclusive mammogram: Secondary | ICD-10-CM | POA: Diagnosis not present

## 2018-09-30 HISTORY — DX: Personal history of irradiation: Z92.3

## 2018-10-04 ENCOUNTER — Other Ambulatory Visit: Payer: Self-pay

## 2018-10-04 DIAGNOSIS — Z1211 Encounter for screening for malignant neoplasm of colon: Secondary | ICD-10-CM

## 2018-10-20 ENCOUNTER — Ambulatory Visit: Payer: 59 | Admitting: Psychology

## 2018-10-21 ENCOUNTER — Encounter: Payer: Self-pay | Admitting: Obstetrics & Gynecology

## 2018-10-21 ENCOUNTER — Ambulatory Visit (INDEPENDENT_AMBULATORY_CARE_PROVIDER_SITE_OTHER): Payer: 59 | Admitting: Obstetrics & Gynecology

## 2018-10-21 VITALS — BP 144/90 | Ht 64.5 in | Wt 155.2 lb

## 2018-10-21 DIAGNOSIS — Z170 Estrogen receptor positive status [ER+]: Secondary | ICD-10-CM

## 2018-10-21 DIAGNOSIS — Z01419 Encounter for gynecological examination (general) (routine) without abnormal findings: Secondary | ICD-10-CM | POA: Diagnosis not present

## 2018-10-21 DIAGNOSIS — Z789 Other specified health status: Secondary | ICD-10-CM

## 2018-10-21 DIAGNOSIS — Z780 Asymptomatic menopausal state: Secondary | ICD-10-CM | POA: Diagnosis not present

## 2018-10-21 DIAGNOSIS — C50112 Malignant neoplasm of central portion of left female breast: Secondary | ICD-10-CM

## 2018-10-21 MED ORDER — PROMETHAZINE HCL 12.5 MG PO TABS: 12.5000 mg | ORAL_TABLET | Freq: Four times a day (QID) | ORAL | 0 refills | Status: DC | PRN

## 2018-10-21 NOTE — Progress Notes (Signed)
Holly Bennett 1966/06/23 [id number]   History:    52 y.o. G4P2A2L2 Married.  Girls 30 and 52 yo (difficult relationship currently).  RP:  Established patient presenting for annual gyn exam   HPI:  Left breast Ca post Lumpectomy/Sentinel LN Bx on 11/17/2017.  Wound infection with debridement x 10 wks.  Radiation Therapy.  On Tamoxifen.  Mammo benign 09/30/2018.  No vaginal bleeding.  No pelvic pain.  No pain with IC.  Urine/BMs normal.  BMI 26.23, higher than usual after going through a difficult year.  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  Health labs with Fam MD.  Attempted to do her Screening colonoscopy but didn't tolerate the Prep, will prescribe Phenergan to help.  Past medical history,surgical history, family history and social history were all reviewed and documented in the EPIC chart.  Gynecologic History No LMP recorded. (Menstrual status: Other). Contraception: condoms Last Pap: 09/2017. Results were: ASCUS/HPV HR neg Last mammogram: 09/2018. Results were: Benign Bone Density: Never Colonoscopy: Scheduled 10/2018  Obstetric History OB History  Gravida Para Term Preterm AB Living  4 2     2 2   SAB TAB Ectopic Multiple Live Births  2            # Outcome Date GA Lbr Len/2nd Weight Sex Delivery Anes PTL Lv  4 SAB           3 SAB           2 Para           1 Para              ROS: A ROS was performed and pertinent positives and negatives are included in the history.  GENERAL: No fevers or chills. HEENT: No change in vision, no earache, sore throat or sinus congestion. NECK: No pain or stiffness. CARDIOVASCULAR: No chest pain or pressure. No palpitations. PULMONARY: No shortness of breath, cough or wheeze. GASTROINTESTINAL: No abdominal pain, nausea, vomiting or diarrhea, melena or bright red blood per rectum. GENITOURINARY: No urinary frequency, urgency, hesitancy or dysuria. MUSCULOSKELETAL: No joint or muscle pain, no back pain, no recent trauma. DERMATOLOGIC: No  rash, no itching, no lesions. ENDOCRINE: No polyuria, polydipsia, no heat or cold intolerance. No recent change in weight. HEMATOLOGICAL: No anemia or easy bruising or bleeding. NEUROLOGIC: No headache, seizures, numbness, tingling or weakness. PSYCHIATRIC: No depression, no loss of interest in normal activity or change in sleep pattern.     Exam:   BP (!) 144/90   Ht 5' 4.5" (1.638 m)   Wt 155 lb 3.2 oz (70.4 kg)   BMI 26.23 kg/m   Body mass index is 26.23 kg/m.  General appearance : Well developed well nourished female. No acute distress HEENT: Eyes: no retinal hemorrhage or exudates,  Neck supple, trachea midline, no carotid bruits, no thyroidmegaly Lungs: Clear to auscultation, no rhonchi or wheezes, or rib retractions  Heart: Regular rate and rhythm, no murmurs or gallops Breast:Examined in sitting and supine position were symmetrical in appearance, no palpable masses or tenderness,  no skin retraction, no nipple inversion, no nipple discharge, no skin discoloration, no axillary or supraclavicular lymphadenopathy Abdomen: no palpable masses or tenderness, no rebound or guarding Extremities: no edema or skin discoloration or tenderness  Pelvic: Vulva: Normal             Vagina: No gross lesions or discharge  Cervix: No gross lesions or discharge.  Pap reflex done  Uterus  AV,  normal size, shape and consistency, non-tender and mobile  Adnexa  Without masses or tenderness  Anus: Normal   Assessment/Plan:  52 y.o. female for annual exam   1. Encounter for routine gynecological examination with Papanicolaou smear of cervix Normal gynecologic exam.  Pap reflex done.  Breasts normal.  BMI 26.23 higher than usual, after a year of going through Breast Cancer treatment.  Very fit.  Will lower caloric/carb intake to get back to her ideal weight.  2. Menopause present Well on no HRT.  No PMB.  Vitamin D supplements, Ca++ intake of 1.5 g/day including nutrition and supplemental, continue  weight bearing activities.  3. Uses condoms for contraception  4. Malignant neoplasm of central portion of left breast in female, estrogen receptor positive ([place]) Post left lumpectomy, radiation therapy.  On Tamoxifen.  Will call for evaluation if has vaginal bleeding.  Other orders - promethazine (PHENERGAN) 12.5 MG tablet; Take 1 tablet (12.5 mg total) by mouth every 6 (six) hours as needed for up to 1 day for nausea or vomiting.  Princess Bruins MD, 3:43 PM 10/21/2018

## 2018-10-24 LAB — PAP IG W/ RFLX HPV ASCU

## 2018-10-25 ENCOUNTER — Encounter: Payer: Self-pay | Admitting: Obstetrics & Gynecology

## 2018-10-25 NOTE — Patient Instructions (Signed)
1. Encounter for routine gynecological examination with Papanicolaou smear of cervix Normal gynecologic exam.  Pap reflex done.  Breasts normal.  BMI 26.23 higher than usual, after a year of going through Breast Cancer treatment.  Very fit.  Will lower caloric/carb intake to get back to her ideal weight.  2. Menopause present Well on no HRT.  No PMB.  Vitamin D supplements, Ca++ intake of 1.5 g/day including nutrition and supplemental, continue weight bearing activities.  3. Uses condoms for contraception  4. Malignant neoplasm of central portion of left breast in female, estrogen receptor positive ([place]) Post left lumpectomy, radiation therapy.  On Tamoxifen.  Will call for evaluation if has vaginal bleeding.  Other orders - promethazine (PHENERGAN) 12.5 MG tablet; Take 1 tablet (12.5 mg total) by mouth every 6 (six) hours as needed for up to 1 day for nausea or vomiting.  Holly Bennett, it was super nice to see you today!  I will inform you of your results as soon as they are available.

## 2018-10-27 ENCOUNTER — Telehealth: Payer: Self-pay | Admitting: Gastroenterology

## 2018-10-27 NOTE — Telephone Encounter (Signed)
Patient called today stating she received the instructions for her colonoscopy and script for Su-prep. However she needs a different script because she has gotten sick off this in the past. Please send in [facility] script to [facility] on [address].She is scheduled for the procedure 11-04-2018.

## 2018-10-27 NOTE — Telephone Encounter (Signed)
LVM asking patient to call office back to discuss changing her prescription SuPrep to another prep.  Thanks [employer]

## 2018-10-28 ENCOUNTER — Telehealth: Payer: Self-pay | Admitting: Gastroenterology

## 2018-10-28 MED ORDER — PEG 3350-KCL-NABCB-NACL-NASULF 236 G PO SOLR: 4000.0000 mL | Freq: Once | ORAL | 0 refills | Status: AC

## 2018-10-28 NOTE — Telephone Encounter (Signed)
Pt is calling for Sharyn Lull about switching her from [place] to another prep due to her getting sick last time please call pt

## 2018-10-28 NOTE — Telephone Encounter (Signed)
Returned patients call to change her Suprep to an alternative.  I advised patient that SuPrep  Was prescribed because its in some cases easier for pt to tolerate as opposed to the gallon bowel prep options.  She said she would try the other option because her obgyn has provided her with anti-nausea medicine.  Her rx has been changed to [facility].  Rx sent to [facility].  Directions were provided as follows: begin drinking at 5pm the evening before procedure drinking 8 oz every 30 minutes until all completed.  Thanks [employer]

## 2018-11-02 ENCOUNTER — Ambulatory Visit
Admission: RE | Admit: 2018-11-02 | Discharge: 2018-11-02 | Disposition: A | Discharge status: Home / Self Care | Payer: 59 | Source: Ambulatory Visit | Attending: Radiation Oncology | Admitting: Radiation Oncology

## 2018-11-02 ENCOUNTER — Encounter: Payer: Self-pay | Admitting: Radiation Oncology

## 2018-11-02 ENCOUNTER — Other Ambulatory Visit: Payer: Self-pay

## 2018-11-02 DIAGNOSIS — Z79810 Long term (current) use of selective estrogen receptor modulators (SERMs): Secondary | ICD-10-CM | POA: Diagnosis not present

## 2018-11-02 DIAGNOSIS — C50112 Malignant neoplasm of central portion of left female breast: Secondary | ICD-10-CM | POA: Insufficient documentation

## 2018-11-02 DIAGNOSIS — Z170 Estrogen receptor positive status [ER+]: Secondary | ICD-10-CM | POA: Insufficient documentation

## 2018-11-02 DIAGNOSIS — C50012 Malignant neoplasm of nipple and areola, left female breast: Secondary | ICD-10-CM | POA: Diagnosis not present

## 2018-11-02 DIAGNOSIS — Z923 Personal history of irradiation: Secondary | ICD-10-CM | POA: Insufficient documentation

## 2018-11-02 DIAGNOSIS — D0512 Intraductal carcinoma in situ of left breast: Secondary | ICD-10-CM

## 2018-11-02 NOTE — Progress Notes (Signed)
Radiation Oncology Follow up Note  Name: Holly Bennett   Date:   11/02/2018 MRN:  [id number] DOB: 05/19/1966    This 53 y.o. 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 She did of issues with healing and still has some tenderness in the nipple areolar complex. She's otherwise asymptomatic. She's currently on tamoxifen tolerating that well without side effect. She had a recent mammogram.which I have reviewed was BI-RADS 2 benign.  COMPLICATIONS OF TREATMENT: none  FOLLOW UP COMPLIANCE: keeps appointments   PHYSICAL EXAM:  There were no vitals taken for this visit. Lungs are clear to A&P cardiac examination essentially unremarkable with regular rate and rhythm. No dominant mass or nodularity is noted in either breast in 2 positions examined. Incision is well-healed. No axillary or supraclavicular adenopathy is appreciated. Cosmetic result is excellent.Well-developed well-nourished patient in NAD. HEENT reveals PERLA, EOMI, discs not visualized.  Oral cavity is clear. No oral mucosal lesions are identified. Neck is clear without evidence of cervical or supraclavicular adenopathy. Lungs are clear to A&P. Cardiac examination is essentially unremarkable with regular rate and rhythm without murmur rub or thrill. Abdomen is benign with no organomegaly or masses noted. Motor sensory and DTR levels are equal and symmetric in the upper and lower extremities. Cranial nerves II through XII are grossly intact. Proprioception is intact. No peripheral adenopathy or edema is identified. No motor or sensory levels are noted. Crude visual fields are within normal  range.  RADIOLOGY RESULTS: mammograms are reviewed and compatible above-stated findings  PLAN: present time she is doing well with no evidence of disease. She continues on tamoxifen without side effect. I'm please were overall progress. I've assured her the tenderness in the nipple areolar complex will improve with time. Patient is to call with any concerns at any time.  I would like to take this opportunity to thank you for allowing me to participate in the care of your patient.Noreene Filbert, MD

## 2018-11-03 ENCOUNTER — Encounter: Payer: Self-pay | Admitting: *Deleted

## 2018-11-04 ENCOUNTER — Other Ambulatory Visit: Payer: Self-pay

## 2018-11-04 ENCOUNTER — Ambulatory Visit
Admission: RE | Admit: 2018-11-04 | Discharge: 2018-11-04 | Disposition: A | Discharge status: Home / Self Care | Payer: 59 | Attending: Gastroenterology | Admitting: Gastroenterology

## 2018-11-04 ENCOUNTER — Ambulatory Visit: Payer: 59 | Admitting: Certified Registered Nurse Anesthetist

## 2018-11-04 ENCOUNTER — Encounter
Admission: RE | Disposition: A | Discharge status: Home / Self Care | Payer: Self-pay | Source: Home / Self Care | Attending: Gastroenterology

## 2018-11-04 DIAGNOSIS — Z91048 Other nonmedicinal substance allergy status: Secondary | ICD-10-CM | POA: Insufficient documentation

## 2018-11-04 DIAGNOSIS — Z803 Family history of malignant neoplasm of breast: Secondary | ICD-10-CM | POA: Insufficient documentation

## 2018-11-04 DIAGNOSIS — Z87442 Personal history of urinary calculi: Secondary | ICD-10-CM | POA: Diagnosis not present

## 2018-11-04 DIAGNOSIS — Z79899 Other long term (current) drug therapy: Secondary | ICD-10-CM | POA: Diagnosis not present

## 2018-11-04 DIAGNOSIS — Z85828 Personal history of other malignant neoplasm of skin: Secondary | ICD-10-CM | POA: Insufficient documentation

## 2018-11-04 DIAGNOSIS — Z79810 Long term (current) use of selective estrogen receptor modulators (SERMs): Secondary | ICD-10-CM | POA: Insufficient documentation

## 2018-11-04 DIAGNOSIS — Z1211 Encounter for screening for malignant neoplasm of colon: Secondary | ICD-10-CM | POA: Diagnosis not present

## 2018-11-04 DIAGNOSIS — Z8371 Family history of colonic polyps: Secondary | ICD-10-CM | POA: Insufficient documentation

## 2018-11-04 DIAGNOSIS — Z880 Allergy status to penicillin: Secondary | ICD-10-CM | POA: Diagnosis not present

## 2018-11-04 DIAGNOSIS — Z853 Personal history of malignant neoplasm of breast: Secondary | ICD-10-CM | POA: Insufficient documentation

## 2018-11-04 HISTORY — DX: Other specified postprocedural states: Z98.890

## 2018-11-04 HISTORY — DX: Nausea with vomiting, unspecified: R11.2

## 2018-11-04 HISTORY — DX: Other complications of anesthesia, initial encounter: T88.59XA

## 2018-11-04 HISTORY — PX: COLONOSCOPY WITH PROPOFOL: SHX5780

## 2018-11-04 HISTORY — DX: Adverse effect of unspecified anesthetic, initial encounter: T41.45XA

## 2018-11-04 LAB — POCT PREGNANCY, URINE: Preg Test, Ur: NEGATIVE

## 2018-11-04 SURGERY — COLONOSCOPY WITH PROPOFOL
Anesthesia: General

## 2018-11-04 MED ORDER — SODIUM CHLORIDE 0.9 % IV SOLN
INTRAVENOUS | Status: DC
  Administered 2018-11-04: 08:00:00 via INTRAVENOUS

## 2018-11-04 MED ORDER — PROPOFOL 10 MG/ML IV BOLUS
INTRAVENOUS | Status: DC | PRN
  Administered 2018-11-04: 50 mg via INTRAVENOUS
  Administered 2018-11-04: 70 mg via INTRAVENOUS

## 2018-11-04 MED ORDER — LIDOCAINE HCL (CARDIAC) PF 100 MG/5ML IV SOSY
PREFILLED_SYRINGE | INTRAVENOUS | Status: DC | PRN
  Administered 2018-11-04: 50 mg via INTRAVENOUS

## 2018-11-04 MED ORDER — PROPOFOL 500 MG/50ML IV EMUL
INTRAVENOUS | Status: DC | PRN
  Administered 2018-11-04: 175 ug/kg/min via INTRAVENOUS

## 2018-11-04 MED ORDER — PROPOFOL 500 MG/50ML IV EMUL
INTRAVENOUS | Status: AC
  Filled 2018-11-04: qty 50

## 2018-11-04 NOTE — H&P (Signed)
Holly Bellows, MD [address] [place], [place], [place], [address] [address], [place], [place], [place], [address] Phone: [phone]  Fax: [phone]  Primary Care Physician:  Holly Bruins, MD   Pre-Procedure History & Physical: HPI:  Holly Bennett is a 53 y.o. female is here for an colonoscopy.   Past Medical History:  Diagnosis Date  . Acute right flank pain 01/21/2011  . Back pain 01/21/2011  . Cancer of central portion of left female breast ([place]) 10/23/2017   also skin cancer on calf(5 yrs ago) and cheek (august 2018)  . Complication of anesthesia   . Heart murmur    found once many years ago  . 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 History of kidney stones    passed twice  . Personal history of other malignant neoplasm of skin 06/10/2017  . Personal history of radiation therapy    LEFFT lumpectomy  . PONV (postoperative nausea and vomiting)   . Viral warts, unspecified     Past Surgical History:  Procedure Laterality Date  . APPENDECTOMY  1983  . AXILLARY SENTINEL NODE BIOPSY Left 11/17/2017   Procedure: AXILLARY SENTINEL NODE BIOPSY;  Surgeon: Olean Ree, MD;  Location: [facility] ORS;  Service: General;  Laterality: Left;  . BREAST BIOPSY Left 10/15/2017   INVASIVE MAMMARY CARCINOMA  . BREAST LUMPECTOMY Left 11/17/2017   INVASIVE MAMMARY CARCINOMA  . BREAST LUMPECTOMY WITH NEEDLE LOCALIZATION Left 11/17/2017   Procedure: BREAST LUMPECTOMY WITH NEEDLE LOCALIZATION;  Surgeon: Olean Ree, MD;  Location: [facility] ORS;  Service: General;  Laterality: Left;  . DILATION AND CURETTAGE OF UTERUS     x4  . MOHS SURGERY  2018  . WISDOM TOOTH EXTRACTION      Prior to Admission medications   Medication Sig Start Date End Date Taking? Authorizing Provider  Ascorbic Acid (VITAMIN C) 500 MG tablet Take 500 mg by mouth daily.     Yes [provider]  Calcium Carbonate-Vitamin D (CALCIUM 600+D) 600-400 MG-UNIT per tablet Take 1  tablet by mouth daily.     Yes [provider]  Collagen Hydrolysate POWD by Does not apply route.   Yes [provider]  Glucosamine 500 MG CAPS Take 500 mg by mouth daily.    Yes [provider]  Multiple Vitamin (MULTIVITAMIN) tablet Take 1 tablet by mouth daily.     Yes [provider]  promethazine (PHENERGAN) 12.5 MG tablet Take 1 tablet (12.5 mg total) by mouth every 6 (six) hours as needed for up to 1 day for nausea or vomiting. 10/21/18 11/04/18 Yes Holly Bruins, MD  vitamin B-12 (CYANOCOBALAMIN) 100 MCG tablet Take 100 mcg by mouth daily.   Yes [provider]  tamoxifen (NOLVADEX) 20 MG tablet Take 1 tablet (20 mg total) by mouth daily. 04/04/18   Holly Huger, MD    Allergies as of 10/04/2018 - Review Complete 07/14/2018  Allergen Reaction Noted  . Blue dyes (parenteral) Dermatitis 12/27/2017  . Penicillins Rash and Other (See Comments) 10/20/2017    Family History  Problem Relation Age of Onset  . Breast cancer Mother 90       currently 27  . Hypertension Father   . Lung cancer Father 56       smoker; deceased 29  . Breast cancer Other        mat grandmother's sister    Social History   Socioeconomic History  . Marital  status: Married    Spouse name: Not on file  . Number of children: Not on file  . Years of education: Not on file  . Highest education level: Not on file  Occupational History  . Occupation: Clinical research associate at [employer]  . Financial resource strain: Not on file  . Food insecurity:    Worry: Not on file    Inability: Not on file  . Transportation needs:    Medical: Not on file    Non-medical: Not on file  Tobacco Use  . Smoking status: Never Smoker  . Smokeless tobacco: Never Used  Substance and Sexual Activity  . Alcohol use: Yes    Comment: Socially on weekends  . Drug use: No  . Sexual activity: Yes    Partners: Male    Birth control/protection: Pill    Comment: 1st  intercourse- 24, partners- 46, married- 2.5 yrs  Lifestyle  . Physical activity:    Days per week: Not on file    Minutes per session: Not on file  . Stress: Not on file  Relationships  . Social connections:    Talks on phone: Not on file    Gets together: Not on file    Attends religious service: Not on file    Active member of club or organization: Not on file    Attends meetings of clubs or organizations: Not on file    Relationship status: Not on file  . Intimate partner violence:    Fear of current or ex partner: Not on file    Emotionally abused: Not on file    Physically abused: Not on file    Forced sexual activity: Not on file  Other Topics Concern  . Not on file  Social History Narrative  . Not on file    Review of Systems: See HPI, otherwise negative ROS  Physical Exam: BP 132/83   Pulse 66   Temp 98 F (36.7 C) (Oral)   Ht 5' 4.5" (1.638 m)   Wt 66.7 kg   SpO2 100%   BMI 24.84 kg/m  General:   Alert,  pleasant and cooperative in NAD Head:  Normocephalic and atraumatic. Neck:  Supple; no masses or thyromegaly. Lungs:  Clear throughout to auscultation, normal respiratory effort.    Heart:  +S1, +S2, Regular rate and rhythm, No edema. Abdomen:  Soft, nontender and nondistended. Normal bowel sounds, without guarding, and without rebound.   Neurologic:  Alert and  oriented x4;  grossly normal neurologically.  Impression/Plan: Holly Bennett is here for an colonoscopy to be performed for Screening colonoscopy with family history of colon polyps   Risks, benefits, limitations, and alternatives regarding  colonoscopy have been reviewed with the patient.  Questions have been answered.  All parties agreeable.   Holly Bellows, MD  11/04/2018, 7:53 AM

## 2018-11-04 NOTE — Anesthesia Postprocedure Evaluation (Signed)
Anesthesia Post Note  Patient: Holly Bennett  Procedure(s) Performed: COLONOSCOPY WITH PROPOFOL (N/A )  Patient location during evaluation: Endoscopy Anesthesia Type: General Level of consciousness: awake and alert Pain management: pain level controlled Vital Signs Assessment: post-procedure vital signs reviewed and stable Respiratory status: spontaneous breathing and respiratory function stable Cardiovascular status: stable Anesthetic complications: no     Last Vitals:  Vitals:   11/04/18 0831 11/04/18 0841  BP: 106/60 124/63  Pulse: 63 62  Resp: 16 16  Temp:    SpO2: 100% 100%    Last Pain:  Vitals:   11/04/18 0841  TempSrc:   PainSc: 0-No pain                 Ernest Orr K

## 2018-11-04 NOTE — Transfer of Care (Signed)
Immediate Anesthesia Transfer of Care Note  Patient: Holly Bennett  Procedure(s) Performed: COLONOSCOPY WITH PROPOFOL (N/A )  Patient Location: PACU  Anesthesia Type:General  Level of Consciousness: drowsy  Airway & Oxygen Therapy: Patient Spontanous Breathing and Patient connected to nasal cannula oxygen  Post-op Assessment: Report given to RN and Post -op Vital signs reviewed and stable  Post vital signs: Reviewed and stable  Last Vitals:  Vitals Value Taken Time  BP 106/48 11/04/2018  8:21 AM  Temp 36.2 C 11/04/2018  8:21 AM  Pulse 63 11/04/2018  8:21 AM  Resp 14 11/04/2018  8:21 AM  SpO2 100 % 11/04/2018  8:21 AM    Last Pain:  Vitals:   11/04/18 0821  TempSrc: Tympanic  PainSc: Asleep         Complications: No apparent anesthesia complications

## 2018-11-04 NOTE — Anesthesia Procedure Notes (Signed)
Date/Time: 11/04/2018 7:58 AM Performed by: Johnna Acosta, CRNA Pre-anesthesia Checklist: Patient identified, Emergency Drugs available, Suction available, Patient being monitored and Timeout performed Patient Re-evaluated:Patient Re-evaluated prior to induction Oxygen Delivery Method: Nasal cannula Preoxygenation: Pre-oxygenation with 100% oxygen

## 2018-11-04 NOTE — Anesthesia Preprocedure Evaluation (Signed)
Anesthesia Evaluation  Patient identified by MRN, date of birth, ID band Patient awake    Reviewed: Allergy & Precautions, NPO status , Patient's Chart, lab work & pertinent test results  History of Anesthesia Complications Negative for: history of anesthetic complications  Airway Mallampati: II       Dental   Pulmonary neg sleep apnea, neg COPD,           Cardiovascular (-) hypertension(-) Past MI and (-) CHF (-) dysrhythmias + Valvular Problems/Murmurs (murmur, no tx)      Neuro/Psych    GI/Hepatic Neg liver ROS, neg GERD  ,  Endo/Other  neg diabetes  Renal/GU Renal disease (stones)     Musculoskeletal   Abdominal   Peds  Hematology   Anesthesia Other Findings Breast CA  Reproductive/Obstetrics                             Anesthesia Physical Anesthesia Plan  ASA: II  Anesthesia Plan: General   Post-op Pain Management:    Induction:   PONV Risk Score and Plan: 2 and Propofol infusion and TIVA  Airway Management Planned: Nasal Cannula  Additional Equipment:   Intra-op Plan:   Post-operative Plan:   Informed Consent: I have reviewed the patients History and Physical, chart, labs and discussed the procedure including the risks, benefits and alternatives for the proposed anesthesia with the patient or authorized representative who has indicated his/her understanding and acceptance.     Plan Discussed with:   Anesthesia Plan Comments:         Anesthesia Quick Evaluation

## 2018-11-04 NOTE — Anesthesia Post-op Follow-up Note (Signed)
Anesthesia QCDR form completed.        

## 2018-11-04 NOTE — Op Note (Signed)
[facility] Gastroenterology Patient Name: Holly Bennett Procedure Date: 11/04/2018 7:16 AM MRN: [id number] Account #: [id number] Date of Birth: 1966/01/03 Admit Type: Outpatient Age: 53 Room: [facility] ENDO ROOM 4 Gender: Female Note Status: Finalized Procedure:            Colonoscopy Indications:          Colon cancer screening in patient at increased risk:                        Family history of 1st-degree relative with colon polyps Providers:            Jonathon Bellows MD, MD Referring MD:         Princess Bruins (Referring MD) Medicines:            Monitored Anesthesia Care Complications:        No immediate complications. Procedure:            Pre-Anesthesia Assessment:                       - Prior to the procedure, a History and Physical was                        performed, and patient medications, allergies and                        sensitivities were reviewed. The patient's tolerance of                        previous anesthesia was reviewed.                       - The risks and benefits of the procedure and the                        sedation options and risks were discussed with the                        patient. All questions were answered and informed                        consent was obtained.                       - ASA Grade Assessment: II - A patient with mild                        systemic disease.                       After obtaining informed consent, the colonoscope was                        passed under direct vision. Throughout the procedure,                        the patient's blood pressure, pulse, and oxygen                        saturations were monitored continuously. The  Colonoscope was introduced through the anus and                        advanced to the the cecum, identified by the                        appendiceal orifice, IC valve and transillumination.                        The colonoscopy was performed  with ease. The patient                        tolerated the procedure well. The quality of the bowel                        preparation was good. Findings:      The entire examined colon appeared normal on direct and retroflexion       views. Impression:           - The entire examined colon is normal on direct and                        retroflexion views.                       - No specimens collected. Recommendation:       - Discharge patient to home (with escort).                       - Resume previous diet.                       - Continue present medications.                       - Repeat colonoscopy in 5 years for surveillance. Procedure Code(s):    --- Professional ---                       [phone], Colonoscopy, flexible; diagnostic, including                        collection of specimen(s) by brushing or washing, when                        performed (separate procedure) Diagnosis Code(s):    --- Professional ---                       Z83.71, Family history of colonic polyps CPT copyright 2018 American Medical Association. All rights reserved. The codes documented in this report are preliminary and upon coder review may  be revised to meet current compliance requirements. Jonathon Bellows, MD Jonathon Bellows MD, MD 11/04/2018 8:19:11 AM This report has been signed electronically. Number of Addenda: 0 Note Initiated On: 11/04/2018 7:16 AM Scope Withdrawal Time: 0 hours 12 minutes 18 seconds  Total Procedure Duration: 0 hours 17 minutes 46 seconds       [facility]

## 2018-11-29 DIAGNOSIS — H5213 Myopia, bilateral: Secondary | ICD-10-CM | POA: Diagnosis not present

## 2018-11-29 DIAGNOSIS — H524 Presbyopia: Secondary | ICD-10-CM | POA: Diagnosis not present

## 2018-12-15 ENCOUNTER — Telehealth: Payer: Self-pay | Admitting: *Deleted

## 2018-12-15 NOTE — Telephone Encounter (Signed)
Patient informed, will forward to appointment desk to schedule visit.    Just FYI Dr.Lavoie please see the below

## 2018-12-15 NOTE — Telephone Encounter (Signed)
Patient scheduled for February 27 with Dr Dellis Filbert.

## 2018-12-15 NOTE — Telephone Encounter (Signed)
A missed pill may explain it but she does need to be evaluated.  I recommend that she make an appointment to see Dr Dellis Filbert.  It is not urgent.

## 2018-12-15 NOTE — Telephone Encounter (Signed)
Dr. Dellis Filbert patient) taking tamoxifen 20 mg tablet, started bleeding this am, Dr.Lavoie told her to call if she does any bleeding, bright red blood when wiping this am, appear to be brown blood now, history of breast cancer. She did mention she missed at least 1 of her tamoxifen pill this week. Wasn't sure if this could cause bleeding. LMP: June.  Please advise

## 2018-12-17 NOTE — Telephone Encounter (Signed)
On-call note: Last menstrual period June 2019.  No bleeding until 3 days ago when she started light bleeding.  This increased to a menstrual-like flow today with cramping.  Patient on tamoxifen.  Does not feel pregnancy possible.  Has appointment already scheduled beginning of next week with Dr Dellis Filbert.  At this point does not feel her bleeding is excessive.  Recommended ibuprofen over the weekend for her cramping and follow-up for her scheduled appointment.  If bleeding increases and she is concerned as far as blood loss she will call me back this weekend.

## 2018-12-20 ENCOUNTER — Encounter: Payer: Self-pay | Admitting: Obstetrics & Gynecology

## 2018-12-20 ENCOUNTER — Ambulatory Visit: Payer: 59 | Admitting: Obstetrics & Gynecology

## 2018-12-20 VITALS — BP 130/88 | Wt 157.0 lb

## 2018-12-20 DIAGNOSIS — N950 Postmenopausal bleeding: Secondary | ICD-10-CM

## 2018-12-20 DIAGNOSIS — C50112 Malignant neoplasm of central portion of left female breast: Secondary | ICD-10-CM | POA: Diagnosis not present

## 2018-12-20 DIAGNOSIS — Z170 Estrogen receptor positive status [ER+]: Secondary | ICD-10-CM | POA: Diagnosis not present

## 2018-12-20 NOTE — Patient Instructions (Signed)
1. Postmenopausal bleeding Postmenopausal bleeding or peri-menopausal bleeding which looked like a normal menstrual period.  Given that patient is on tamoxifen and had no periods since June 2019, will investigate with a pelvic ultrasound and possible endometrial biopsy to rule out endometrial polyps, submucosal fibroids, endometrial hyperplasia, and endometrial cancer.  Follow-up pelvic ultrasound and possible endometrial biopsy here.  Patient agrees with plan. - US Transvaginal Non-OB; Future   2. Malignant neoplasm of central portion of left breast in female, estrogen receptor positive ([place]) On tamoxifen.  Dorinne, it was a pleasure seeing you today!

## 2018-12-20 NOTE — Progress Notes (Signed)
    Holly Bennett 12-15-1965 [id number]        53 y.o.  [id number] Married  RP: PMB x 4 days on Tamoxifen  HPI:  Left breast Ca post Lumpectomy/Sentinel LN Bx on 11/17/2017.  Wound infection with debridement x 10 wks.  Radiation Therapy.  On Tamoxifen.  Mammo benign 09/30/2018.  No menses x 03/2018, had oligomenorrhea before that menstrual period.  Started a mild period on 2/20, which lasted 4 days, accompanied by pelvic cramps.     OB History  Gravida Para Term Preterm AB Living  4 2     2 2   SAB TAB Ectopic Multiple Live Births  2            # Outcome Date GA Lbr Len/2nd Weight Sex Delivery Anes PTL Lv  4 SAB           3 SAB           2 Para           1 Para             Past medical history,surgical history, problem list, medications, allergies, family history and social history were all reviewed and documented in the EPIC chart.   Directed ROS with pertinent positives and negatives documented in the history of present illness/assessment and plan.  Exam:  Vitals:   12/20/18 1420  BP: 130/88  Weight: 157 lb (71.2 kg)   General appearance:  Normal  Abdomen:  Normal  Gynecologic exam: Vulva normal.  Bimanual exam:  Anteverted uterus, normal volume, mobile, NT.  Cervix normal, no polyp.  Vagina normal.   Assessment/Plan:  53 y.o. [id number]   1. Postmenopausal bleeding Postmenopausal bleeding or peri-menopausal bleeding which looked like a normal menstrual period.  Given that patient is on tamoxifen and had no periods since June 2019, will investigate with a pelvic ultrasound and possible endometrial biopsy to rule out endometrial polyps, submucosal fibroids, endometrial hyperplasia, and endometrial cancer.  Follow-up pelvic ultrasound and possible endometrial biopsy here.  Patient agrees with plan. - US Transvaginal Non-OB; Future   2. Malignant neoplasm of central portion of left breast in female, estrogen receptor positive ([place]) On tamoxifen.  Counseling on above issues  and coordination of care more than 50% for 25 minutes.  Princess Bruins MD, 2:41 PM 12/20/2018

## 2018-12-28 ENCOUNTER — Ambulatory Visit (INDEPENDENT_AMBULATORY_CARE_PROVIDER_SITE_OTHER): Payer: 59

## 2018-12-28 ENCOUNTER — Other Ambulatory Visit: Payer: Self-pay | Admitting: Obstetrics & Gynecology

## 2018-12-28 ENCOUNTER — Encounter: Payer: Self-pay | Admitting: Obstetrics & Gynecology

## 2018-12-28 ENCOUNTER — Ambulatory Visit: Payer: 59 | Admitting: Obstetrics & Gynecology

## 2018-12-28 DIAGNOSIS — C50112 Malignant neoplasm of central portion of left female breast: Secondary | ICD-10-CM

## 2018-12-28 DIAGNOSIS — N950 Postmenopausal bleeding: Secondary | ICD-10-CM

## 2018-12-28 DIAGNOSIS — Z170 Estrogen receptor positive status [ER+]: Secondary | ICD-10-CM

## 2018-12-28 DIAGNOSIS — N83202 Unspecified ovarian cyst, left side: Secondary | ICD-10-CM | POA: Diagnosis not present

## 2018-12-28 DIAGNOSIS — N8312 Corpus luteum cyst of left ovary: Secondary | ICD-10-CM | POA: Diagnosis not present

## 2018-12-28 NOTE — Patient Instructions (Signed)
1. Postmenopausal bleeding Postmenopausal bleeding on tamoxifen.  Pelvic ultrasound findings reviewed with patient.  Endometrial lining is normal and thin at 2.4 mm.  Patient reassured.  2. Corpus luteum cyst of left ovary Small left ovarian cyst measuring 2.1 x 1.7 cm.  Probable organized corpus luteum cyst.  To be on the safe side will follow-up in 3 months to reassess by pelvic ultrasound. - US Transvaginal Non-OB; Future  3. Malignant neoplasm of central portion of left breast in female, estrogen receptor positive ([place]) On Tamoxifen.  BrCa1-2 negative.  Merry, it was a pleasure seeing you today!

## 2018-12-28 NOTE — Progress Notes (Signed)
    Holly Bennett 12/10/1965 [id number]        52 y.o.  [id number] Married  RP: PMB on Tamoxifen for Pelvic US  HPI: Left breast Ca 10/2017.  BrCa1-2 negative.  PMB on Tamoxifen.  No current bleeding.  No pelvic pain.   OB History  Gravida Para Term Preterm AB Living  4 2     2 2  SAB TAB Ectopic Multiple Live Births  2            # Outcome Date GA Lbr Len/2nd Weight Sex Delivery Anes PTL Lv  4 SAB           3 SAB           2 Para           1 Para             Past medical history,surgical history, problem list, medications, allergies, family history and social history were all reviewed and documented in the EPIC chart.   Directed ROS with pertinent positives and negatives documented in the history of present illness/assessment and plan.  Exam:  There were no vitals filed for this visit. General appearance:  Normal  Pelvic US today: T/V images.  Uterus is anteverted, heterogeneous with cortical cystic areas, measuring 7.77 x 5.46 x 4.34 cm.  The endometrial lining is normal measuring 2.4 mm.  Right ovary normal.  Left ovary with a thick-walled cystic probably corpus luteum cyst measuring 2.1 x 1.7 cm.  Color flow Doppler is positive at the peri-ferry.  No free fluid in the posterior cul-de-sac.   Assessment/Plan:  52 y.o. [id number]   1. Postmenopausal bleeding Postmenopausal bleeding on tamoxifen.  Pelvic ultrasound findings reviewed with patient.  Endometrial lining is normal and thin at 2.4 mm.  Patient reassured.  2. Corpus luteum cyst of left ovary Small left ovarian cyst measuring 2.1 x 1.7 cm.  Probable organized corpus luteum cyst.  To be on the safe side will follow-up in 3 months to reassess by pelvic ultrasound. - US Transvaginal Non-OB; Future  3. Malignant neoplasm of central portion of left breast in female, estrogen receptor positive ([facility]) On Tamoxifen.  BrCa1-2 negative.  Counseling on above issues and coordination of care more than 50% for 15  minutes.  Marie-Lyne Lavoie MD, 12:26 PM 12/28/2018     

## 2019-01-14 ENCOUNTER — Telehealth: Payer: Self-pay | Admitting: Oncology

## 2019-01-14 NOTE — Telephone Encounter (Signed)
Pt called to r\s 3/26 appt. No answer. VM was left for pt to contact me Monday morning to r\s due to Office precautions.

## 2019-01-19 ENCOUNTER — Inpatient Hospital Stay: Payer: 59 | Admitting: Oncology

## 2019-02-20 IMAGING — MG 2D DIGITAL SCREENING BILATERAL MAMMOGRAM WITH CAD AND ADJUNCT TO
9 of 12 series · 9 of 28 positions shown · non-contrast
Comparison: Previous exam(s).

CLINICAL DATA: Screening.

EXAM:
2D DIGITAL SCREENING BILATERAL MAMMOGRAM WITH CAD AND ADJUNCT TOMO

[L MLO synth-2D]
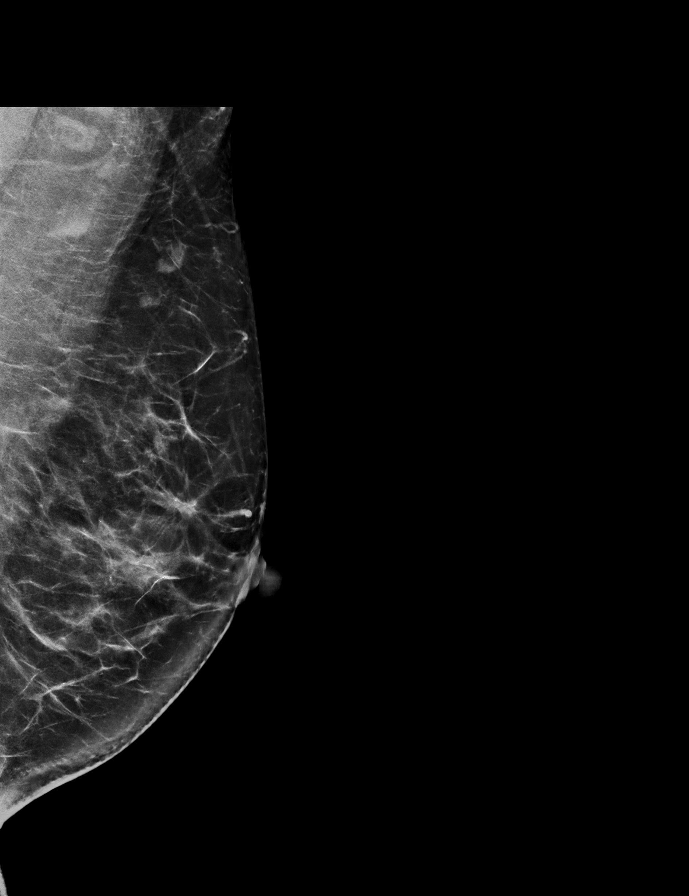

[R MLO]
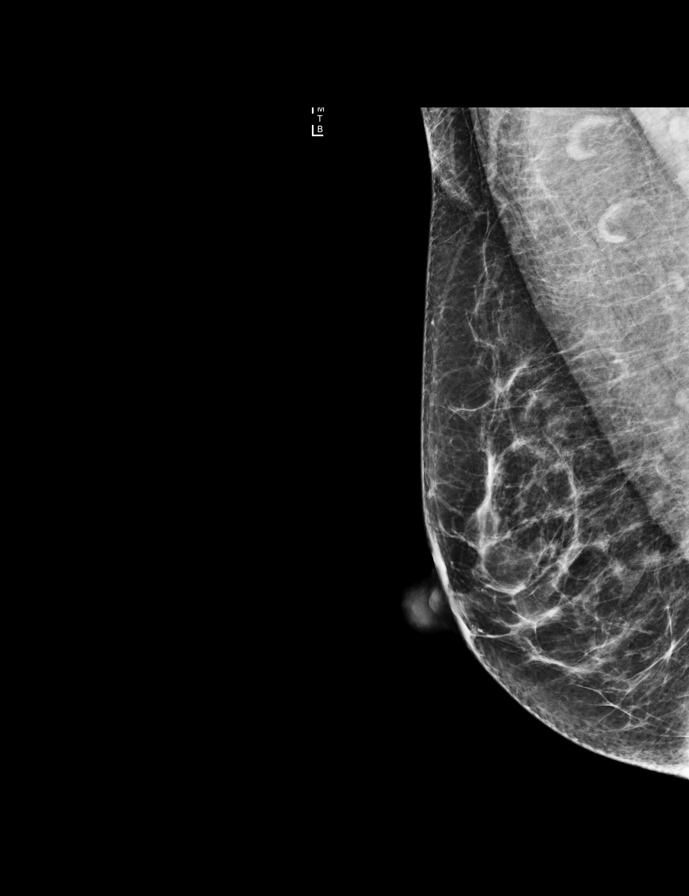

[L CC synth-2D]
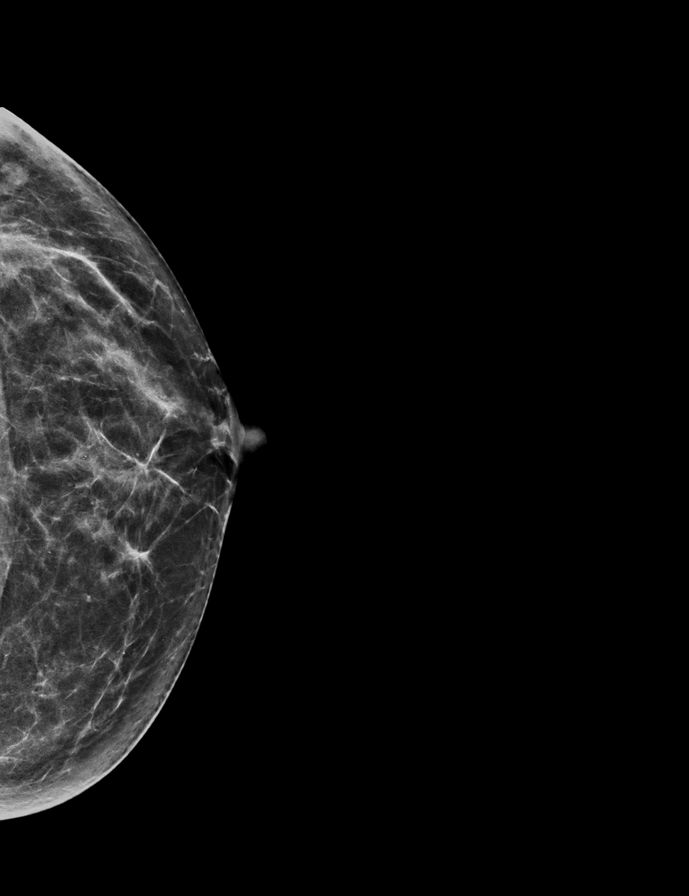

[R CC synth-2D]
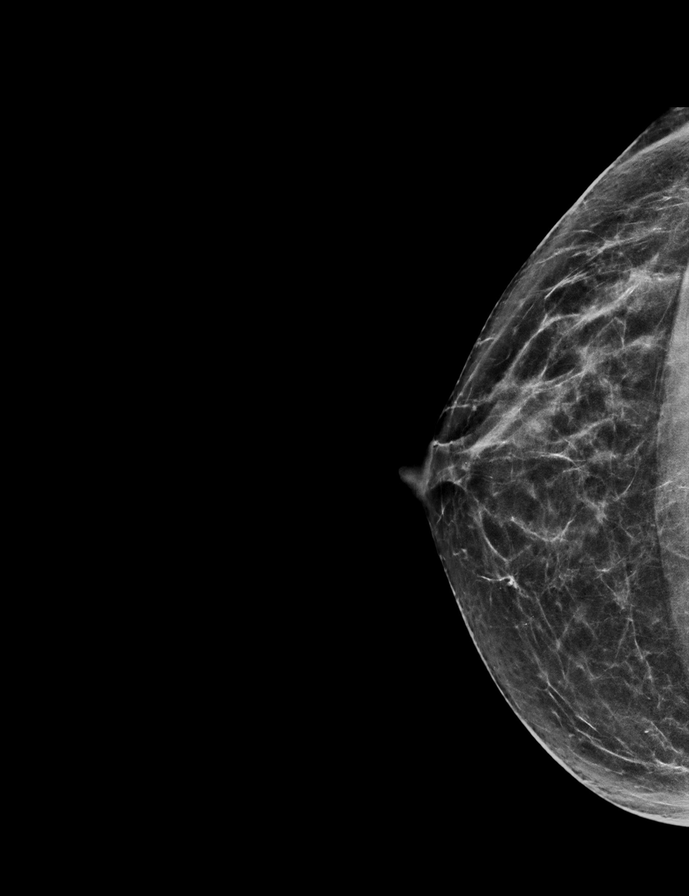

[L MLO]
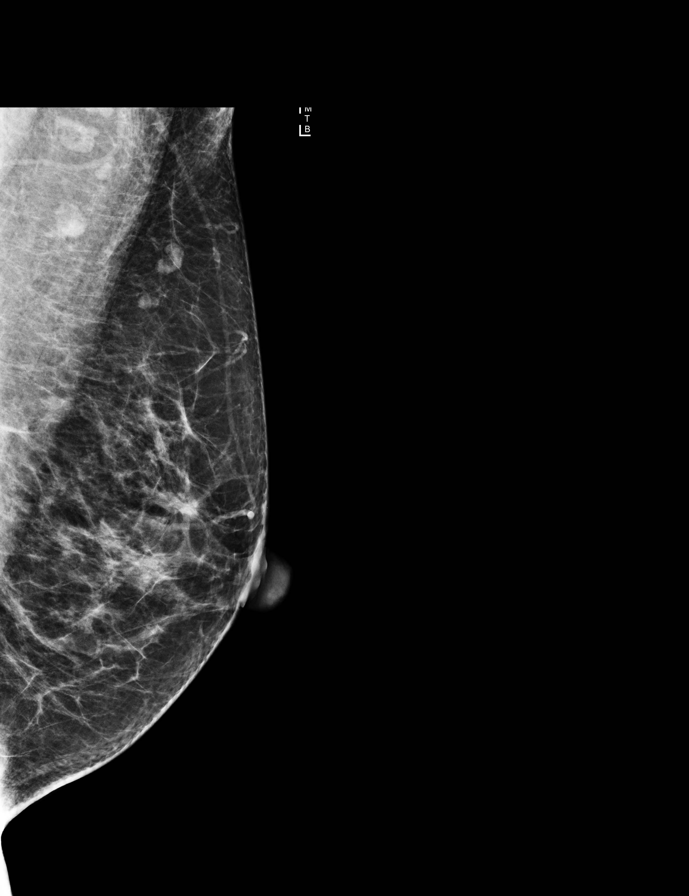

[R CC]
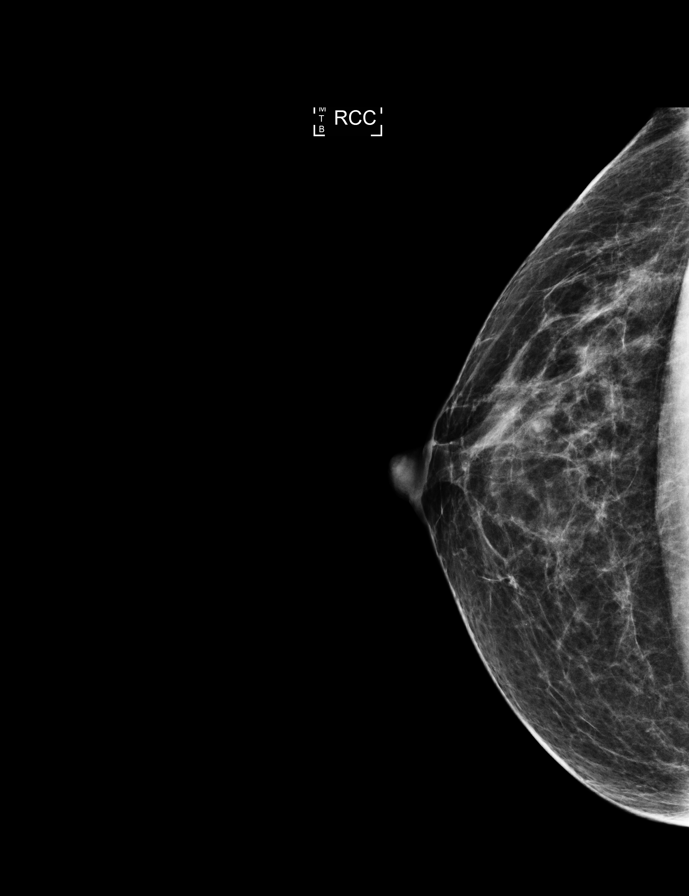

[R MLO synth-2D]
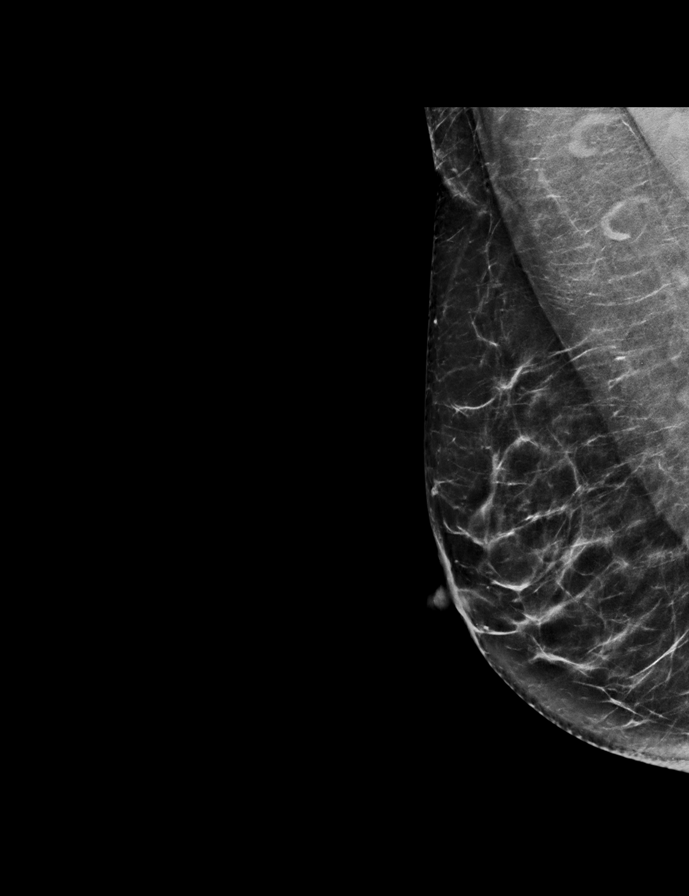

[L CC]
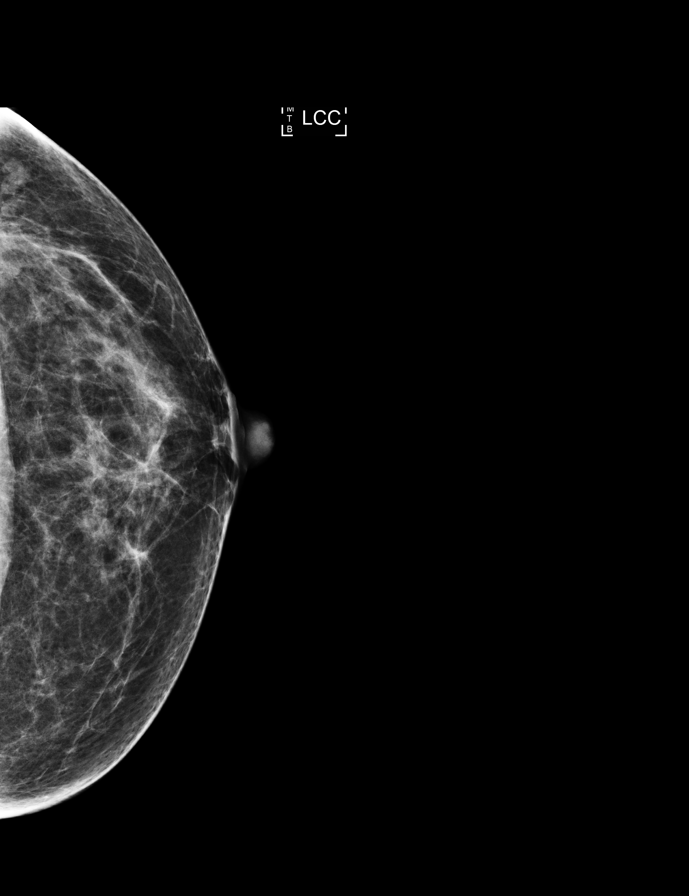

[R MLO tomo · tomo slice 35/69.0]
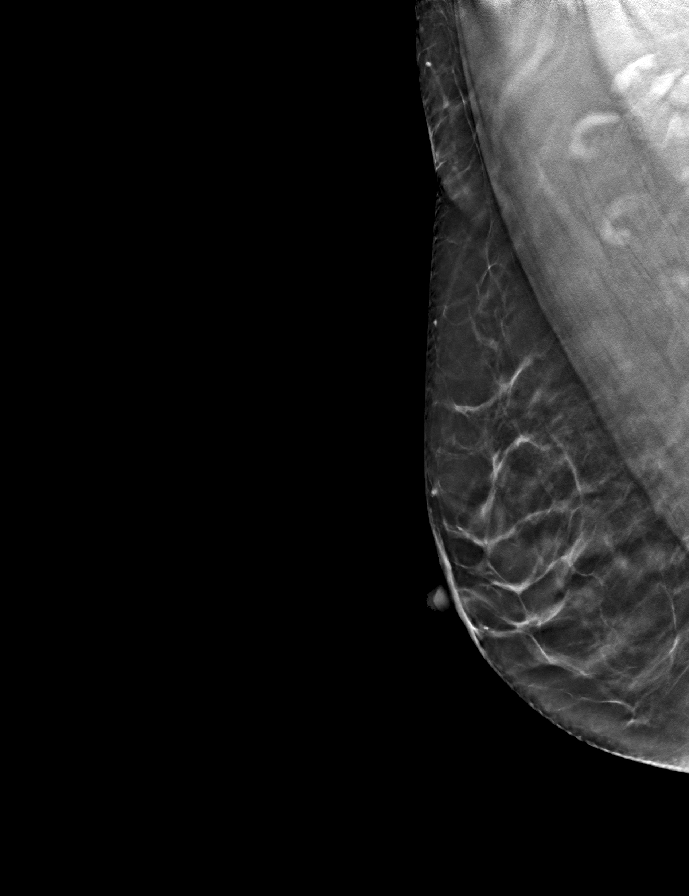

[9 of 28 positions shown; findings below may reference images not displayed]

ACR Breast Density Category b: There are scattered areas of
fibroglandular density.
FINDINGS: In the left breast, a possible mass warrants further evaluation. In
the right breast, no findings suspicious for malignancy.

Images were processed with CAD.
IMPRESSION: Further evaluation is suggested for possible mass in the left
breast.

RECOMMENDATION:
Diagnostic mammogram and possibly ultrasound of the left breast.
(Code:[id number])

The patient will be contacted regarding the findings, and additional
imaging will be scheduled.

BI-RADS CATEGORY  0: Incomplete. Need additional imaging evaluation
and/or prior mammograms for comparison.

## 2019-02-23 ENCOUNTER — Ambulatory Visit: Payer: 59 | Admitting: Oncology

## 2019-02-27 ENCOUNTER — Telehealth: Payer: Self-pay | Admitting: Oncology

## 2019-02-27 ENCOUNTER — Telehealth: Payer: Self-pay

## 2019-02-27 NOTE — Telephone Encounter (Signed)
Letter had been sent to patient so she could contact us and schedule an appointment with Dr. Grayland Ormond.

## 2019-02-27 NOTE — Telephone Encounter (Signed)
I have left the patient two messages in reference to appt changes needing to be made. I have not received a return call.

## 2019-03-01 ENCOUNTER — Telehealth: Payer: Self-pay | Admitting: Oncology

## 2019-03-02 ENCOUNTER — Encounter: Payer: Self-pay | Admitting: Oncology

## 2019-03-02 ENCOUNTER — Inpatient Hospital Stay: Payer: 59 | Attending: Oncology | Admitting: Oncology

## 2019-03-02 DIAGNOSIS — Z923 Personal history of irradiation: Secondary | ICD-10-CM

## 2019-03-02 DIAGNOSIS — Z170 Estrogen receptor positive status [ER+]: Secondary | ICD-10-CM | POA: Diagnosis not present

## 2019-03-02 DIAGNOSIS — Z79810 Long term (current) use of selective estrogen receptor modulators (SERMs): Secondary | ICD-10-CM | POA: Diagnosis not present

## 2019-03-02 DIAGNOSIS — C50112 Malignant neoplasm of central portion of left female breast: Secondary | ICD-10-CM

## 2019-03-02 NOTE — Progress Notes (Signed)
Telephone visit for follow up regarding breast cancer. Patient reports she is tolerating Tamoxifen well overall, is having some joint pain in her ankles.

## 2019-03-02 NOTE — Progress Notes (Signed)
[place] [facility]  Telephone:([phone]) [phone] Fax:(336) [phone]  ID: Holly Bennett OB: 10-06-1966  MR#: [id number]  MCE#:[id number]  Patient Care Team: Princess Bruins, MD as PCP - General (Obstetrics and Gynecology)  I connected with Holly Bennett on 03/02/19 at  2:00 PM EDT by video enabled telemedicine visit and verified that I am speaking with the correct person using two identifiers.   I discussed the limitations, risks, security and privacy concerns of performing an evaluation and management service by telemedicine and the availability of in-person appointments. I also discussed with the patient that there may be a patient responsible charge related to this service. The patient expressed understanding and agreed to proceed.   Other persons participating in the visit and their role in the encounter: Patient, MD  Patient's location: Home Provider's location: Clinic  CHIEF COMPLAINT: Clinical stage IA ER/PR positive, HER-2 negative invasive carcinoma of the central portion of the left breast.  INTERVAL HISTORY: Patient agreed to video enabled telemedicine visit for her routine 22-monthevaluation.  She recently had some abnormal uterine bleeding and was evaluated by gynecology.  This is now resolved.  She did notice a mild increase in hot flashes.  She also has increased ankle pain which she attributes to working out in her driveway on cement more recently.  She otherwise feels well and is asymptomatic.  She has no neurologic complaints.  She denies any recent fevers or illnesses.  She has a good appetite and denies weight loss.  She denies any chest pain, shortness of breath, cough, or hemoptysis.  She denies any nausea, vomiting, constipation, or diarrhea.  She has no urinary complaints.  Patient offers no further specific complaints today.  REVIEW OF SYSTEMS:   Review of Systems  Constitutional: Negative.  Negative for fever, malaise/fatigue and weight loss.  Respiratory:  Negative.  Negative for cough and shortness of breath.   Cardiovascular: Negative.  Negative for chest pain and leg swelling.  Gastrointestinal: Negative.  Negative for abdominal pain.  Genitourinary: Negative.  Negative for dysuria.  Musculoskeletal: Positive for joint pain. Negative for back pain.  Skin: Negative.  Negative for rash.  Neurological: Positive for sensory change. Negative for focal weakness and weakness.  Psychiatric/Behavioral: Negative.  The patient is not nervous/anxious.     As per HPI. Otherwise, a complete review of systems is negative.  PAST MEDICAL HISTORY: Past Medical History:  Diagnosis Date  . Acute right flank pain 01/21/2011  . Back pain 01/21/2011  . Cancer of central [place] of left [place] breast [place] 10/23/2017   also skin cancer on calf(5 yrs ago) and cheek (august 2018)  . Complication of anesthesia   . Heart murmur    found once many years ago  . 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 History of kidney stones    passed twice  . Personal history of other malignant neoplasm of skin 06/10/2017  . Personal history of radiation therapy    LEFFT lumpectomy  . PONV (postoperative nausea and vomiting)   . Viral warts, unspecified     PAST SURGICAL HISTORY: Past Surgical History:  Procedure Laterality Date  . APPENDECTOMY  1983  . AXILLARY SENTINEL NODE BIOPSY Left 11/17/2017   Procedure: AXILLARY SENTINEL NODE BIOPSY;  Surgeon: POlean Ree MD;  Location: [facility] ORS;  Service: General;  Laterality: Left;  . BREAST BIOPSY Left 10/15/2017   INVASIVE MAMMARY CARCINOMA  . BREAST LUMPECTOMY Left 11/17/2017   INVASIVE MAMMARY CARCINOMA  .  BREAST LUMPECTOMY WITH NEEDLE LOCALIZATION Left 11/17/2017   Procedure: BREAST LUMPECTOMY WITH NEEDLE LOCALIZATION;  Surgeon: Olean Ree, MD;  Location: [facility] ORS;  Service: General;  Laterality: Left;  . COLONOSCOPY WITH PROPOFOL N/A 11/04/2018   Procedure: COLONOSCOPY WITH PROPOFOL;  Surgeon:  Jonathon Bellows, MD;  Location: [facility] ENDOSCOPY;  Service: Gastroenterology;  Laterality: N/A;  . DILATION AND CURETTAGE OF UTERUS     x4  . MOHS SURGERY  2018  . WISDOM TOOTH EXTRACTION      FAMILY HISTORY: Family History  Problem Relation Age of Onset  . Breast cancer Mother 71       currently 64  . Hypertension Father   . Lung cancer Father 54       smoker; deceased 25  . Breast cancer Other        mat grandmother's sister    ADVANCED DIRECTIVES (Y/N):  N  HEALTH MAINTENANCE: Social History   Tobacco Use  . Smoking status: Never Smoker  . Smokeless tobacco: Never Used  Substance Use Topics  . Alcohol use: Yes    Comment: Socially on weekends  . Drug use: No     Colonoscopy:  PAP:  Bone density:  Lipid panel:  Allergies  Allergen Reactions  . Blue Dyes (Parenteral) Dermatitis  . Penicillins Rash and Other (See Comments)    Has patient had a PCN reaction causing immediate rash, facial/tongue/throat swelling, SOB or lightheadedness with hypotension: Unknown Has patient had a PCN reaction causing severe rash involving mucus membranes or skin necrosis: No Has patient had a PCN reaction that required hospitalization: No Has patient had a PCN reaction occurring within the last 10 years: No If all of the above answers are "NO", then may proceed with Cephalosporin use.     Current Outpatient Medications  Medication Sig Dispense Refill  . Ascorbic Acid (VITAMIN C) 500 MG tablet Take 500 mg by mouth daily.      . Calcium Carbonate-Vitamin D (CALCIUM 600+D) 600-400 MG-UNIT per tablet Take 1 tablet by mouth daily.      . Collagen Hydrolysate POWD by Does not apply route.    . Glucosamine 500 MG CAPS Take 500 mg by mouth daily.     . Multiple Vitamin (MULTIVITAMIN) tablet Take 1 tablet by mouth daily.      . tamoxifen (NOLVADEX) 20 MG tablet Take 1 tablet (20 mg total) by mouth daily. 90 tablet 3  . vitamin B-12 (CYANOCOBALAMIN) 100 MCG tablet Take 100 mcg by mouth daily.     . promethazine (PHENERGAN) 12.5 MG tablet Take 1 tablet (12.5 mg total) by mouth every 6 (six) hours as needed for up to 1 day for nausea or vomiting. 4 tablet 0   No current facility-administered medications for this visit.     OBJECTIVE: There were no vitals filed for this visit.   There is no height or weight on file to calculate BMI.    ECOG FS:0 - Asymptomatic  General: Well-developed, well-nourished, no acute distress. HEENT: Normocephalic. Neuro: Alert, answering all questions appropriately. Cranial nerves grossly intact. Skin: No rashes or petechiae noted. Psych: Normal affect.  LAB RESULTS:  No results found for: NA, K, CL, CO2, GLUCOSE, BUN, CREATININE, CALCIUM, PROT, ALBUMIN, AST, ALT, ALKPHOS, BILITOT, GFRNONAA, GFRAA  Lab Results  Component Value Date   WBC 6.7 03/28/2018   HGB 12.5 03/28/2018   HCT 36.0 03/28/2018   MCV 88.6 03/28/2018   PLT 244 03/28/2018     STUDIES: No results found.  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  Oncotype DX score 16, which is considered low risk.   PLAN:    1. 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  She had difficulty with wound healing possibly secondary to her methylene blue injection for sentinel node biopsy.  She did not require adjuvant chemotherapy given her low risk Oncotype.  Patient completed adjuvant XRT on April 06, 2018.  Given her mild increase in hot flashes and abnormal uterine bleeding, patient may be entering menopause.  Despite this, will continue tamoxifen for a total of 5 years completing treatment in June 2024.  Her most recent mammogram on September 30, 2018 was reported as BI-RADS 2.  Repeat in December 2020.  Return to clinic in 6 months for routine evaluation.    2.  Genetic testing:  Appreciate genetic counseling input.  Patient was noted to have a variant of unknown significance.  I provided 20 minutes of face-to-face video visit time during this encounter, and > 50% was spent counseling as documented under my assessment & plan.   Patient expressed understanding and was in agreement with this plan. She also understands that She can call clinic at any time with any questions, concerns, or complaints.   Cancer Staging Malignant neoplasm of left breast in female, estrogen receptor positive ([place]) Staging form: Breast, AJCC 8th Edition - Clinical stage from 10/23/2017: Stage IA (cT1a, cN0, cM0, G2, ER: Positive, PR: Positive, HER2: Negative) - Signed by Lloyd Huger, MD on 12/09/2017   Lloyd Huger, MD   03/02/2019 2:32 PM

## 2019-03-03 ENCOUNTER — Inpatient Hospital Stay: Payer: 59 | Admitting: Oncology

## 2019-03-07 DIAGNOSIS — L570 Actinic keratosis: Secondary | ICD-10-CM | POA: Diagnosis not present

## 2019-03-07 DIAGNOSIS — D229 Melanocytic nevi, unspecified: Secondary | ICD-10-CM | POA: Diagnosis not present

## 2019-03-07 DIAGNOSIS — L820 Inflamed seborrheic keratosis: Secondary | ICD-10-CM | POA: Diagnosis not present

## 2019-03-07 DIAGNOSIS — Z1283 Encounter for screening for malignant neoplasm of skin: Secondary | ICD-10-CM | POA: Diagnosis not present

## 2019-03-07 DIAGNOSIS — Z85828 Personal history of other malignant neoplasm of skin: Secondary | ICD-10-CM | POA: Diagnosis not present

## 2019-03-08 IMAGING — MG MM CLIP PLACEMENT
2 series · 2 of 2 positions shown · non-contrast
Comparison: Previous exam(s).

CLINICAL DATA: Post ultrasound-guided core needle biopsy of left
breast 12 o'clock mass/distortion.

EXAM:
DIAGNOSTIC LEFT MAMMOGRAM POST ULTRASOUND BIOPSY

[L ML]
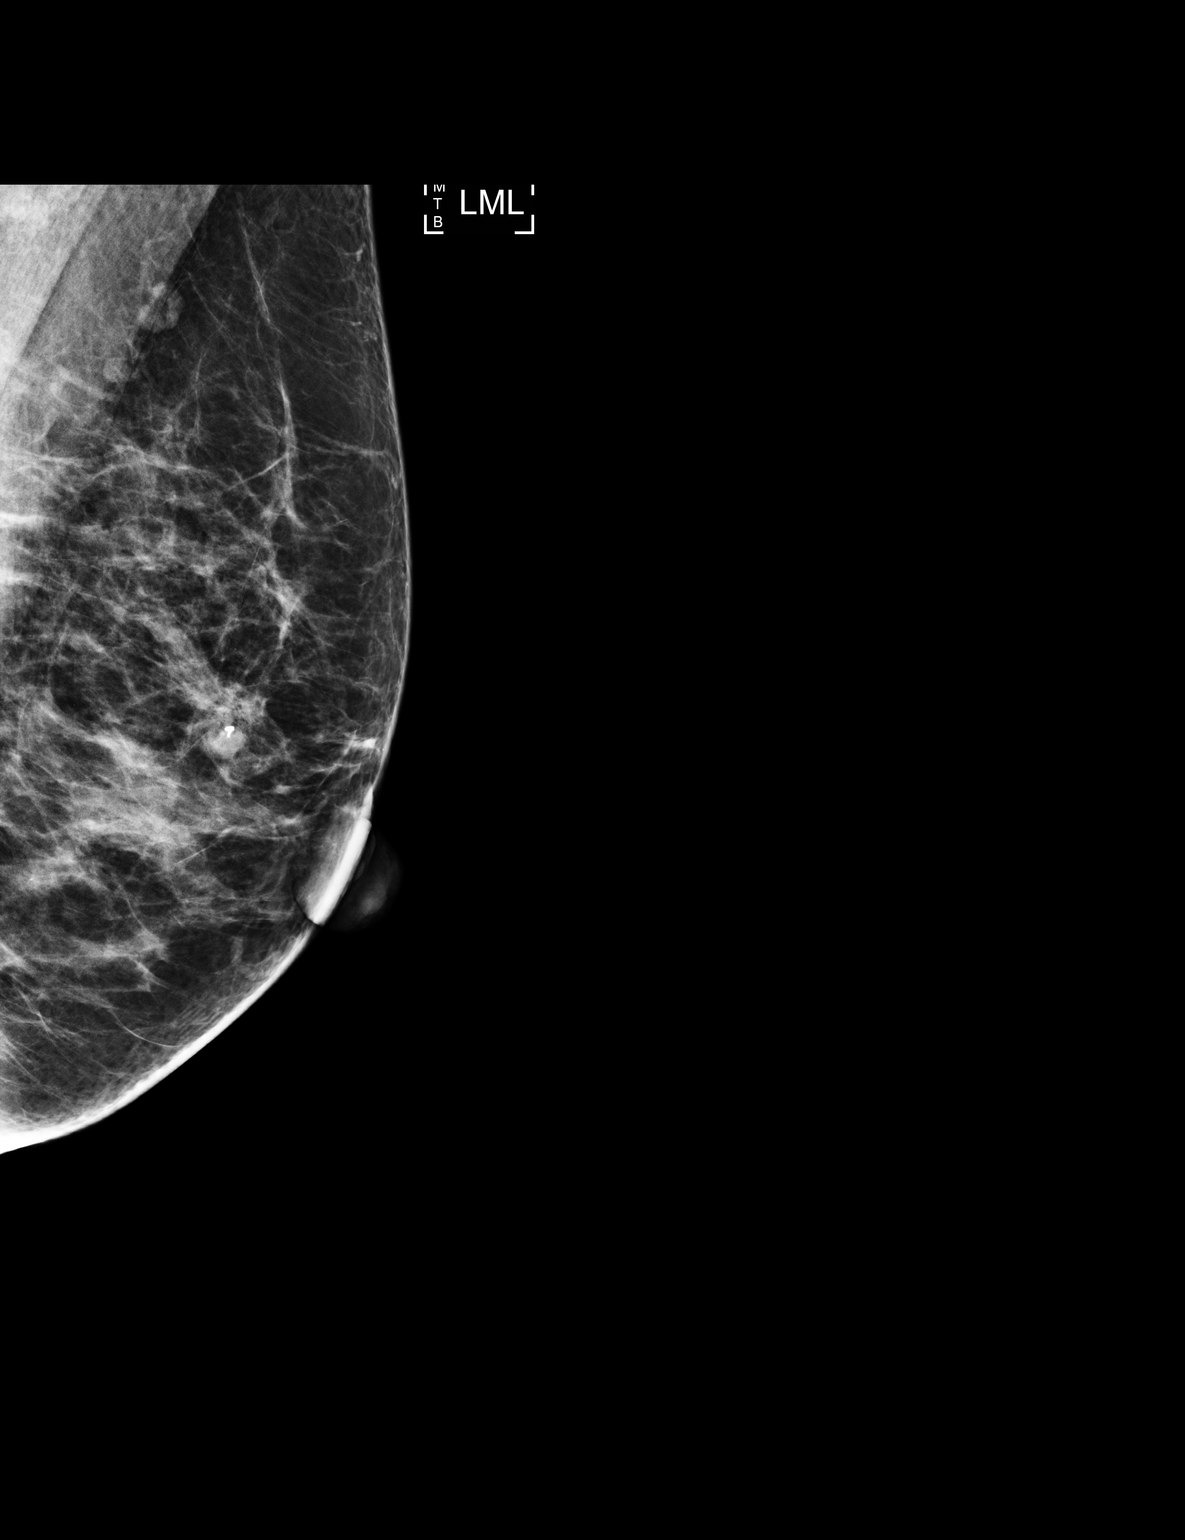

[L CC]
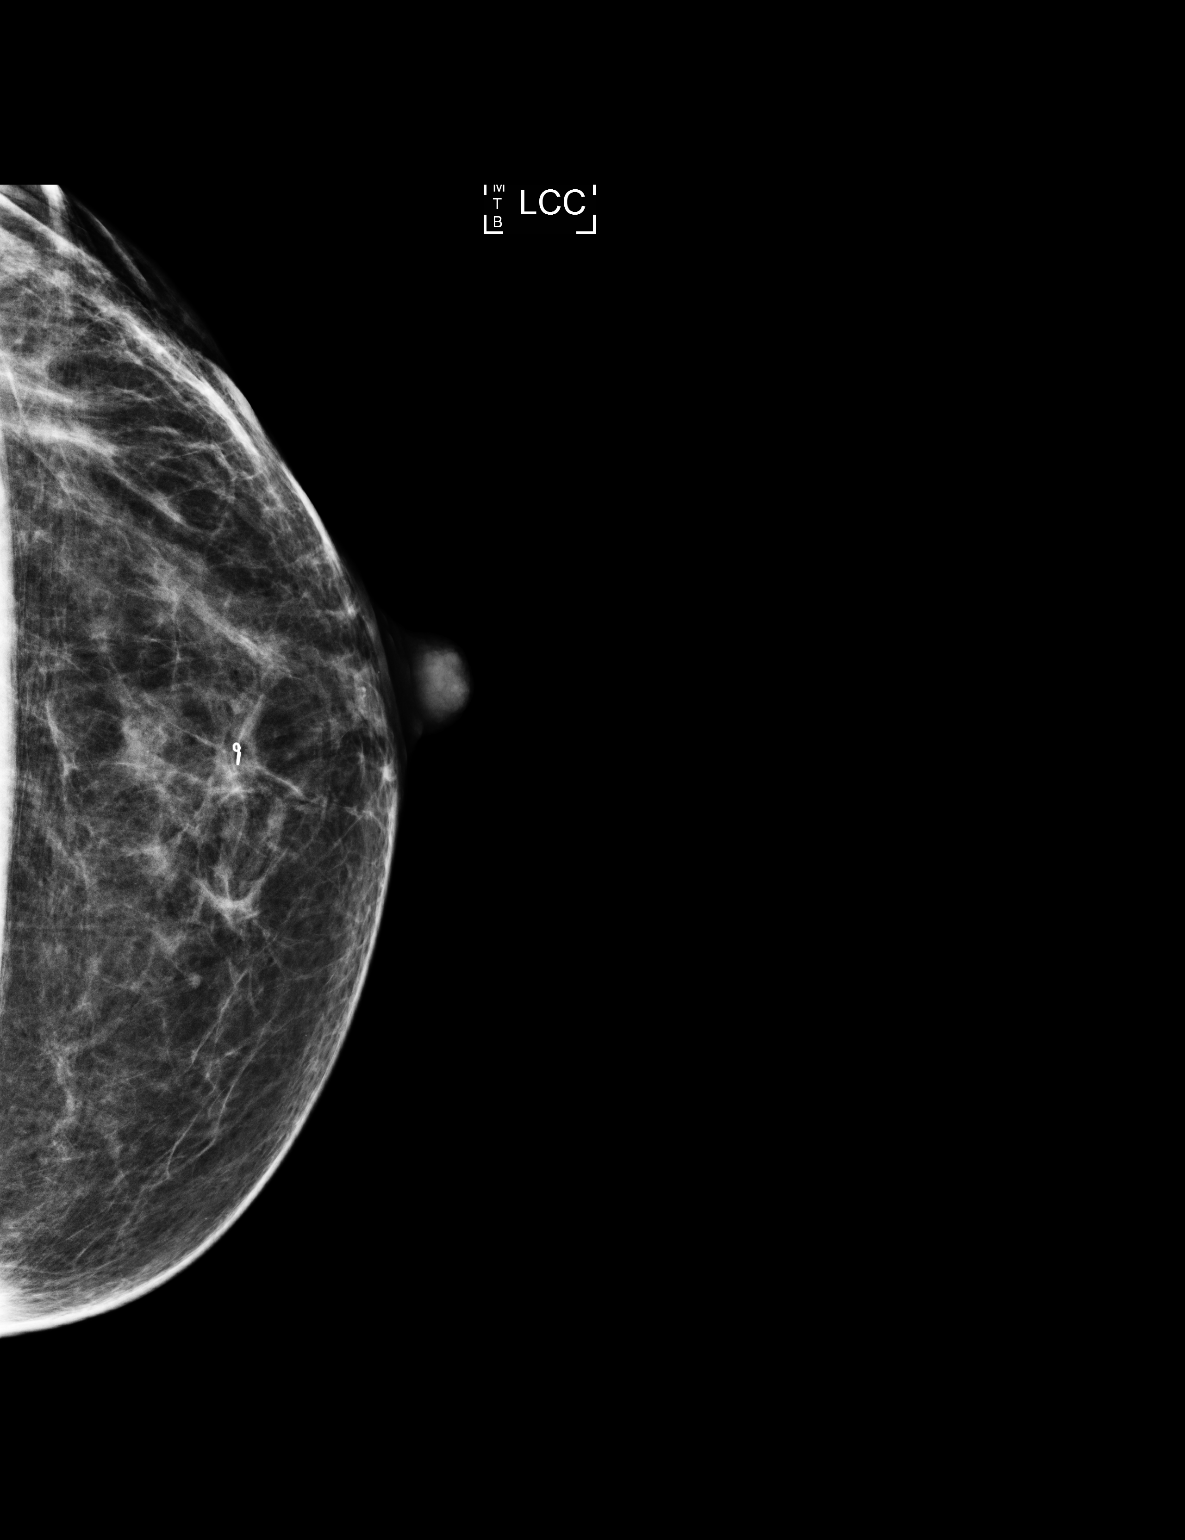

[2 of 2 positions shown; findings below may reference images not displayed]

FINDINGS: Mammographic images were obtained following ultrasound guided biopsy
of left breast. Two-view mammography demonstrates presence of wing
shaped marker in the left breast slightly upper inner quadrant,
anterior depth, at the site of the original mammographically
identified distortion/asymmetry. Mild post biopsy changes are seen.
IMPRESSION: Appropriate positioning of wing shaped marker post ultrasound-guided
core needle biopsy of left breast mass/distortion.

Final Assessment: Post Procedure Mammograms for Marker Placement

## 2019-04-05 ENCOUNTER — Other Ambulatory Visit: Payer: 59

## 2019-04-05 ENCOUNTER — Ambulatory Visit: Payer: 59 | Admitting: Obstetrics & Gynecology

## 2019-04-07 ENCOUNTER — Encounter: Payer: Self-pay | Admitting: *Deleted

## 2019-04-08 ENCOUNTER — Other Ambulatory Visit: Payer: Self-pay | Admitting: Oncology

## 2019-05-09 IMAGING — US ULTRASOUND GUIDED BREAST CYST ASPIRATION
1 series · 5 of 5 positions shown · non-contrast
Comparison: 11/17/2017 and earlier

CLINICAL DATA: Patient presents for ultrasound-guided aspiration of
LEFT breast abscess. Culture and sensitivity from recent fluid
collection is pending.

EXAM:
ULTRASOUND GUIDED LEFT BREAST ASPIRATION

[Series 1: ultrasound guided breast cyst aspiration · 0.06mm/px · 5 of 5 slices shown]
[im 1/5]
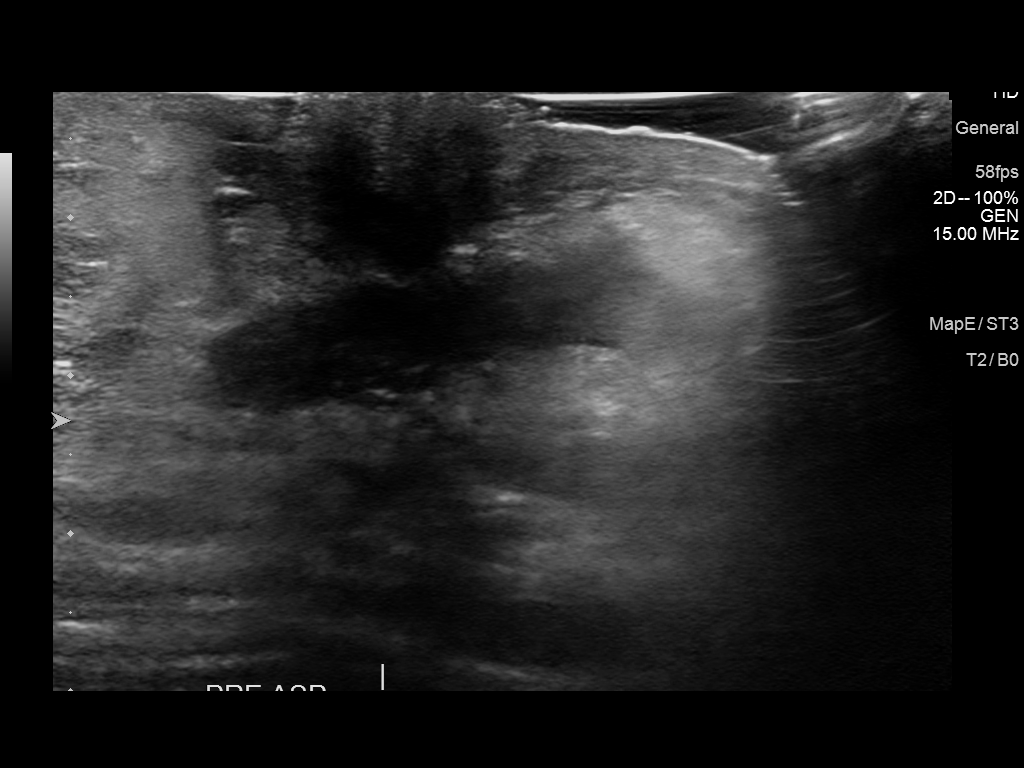
[im 2/5]
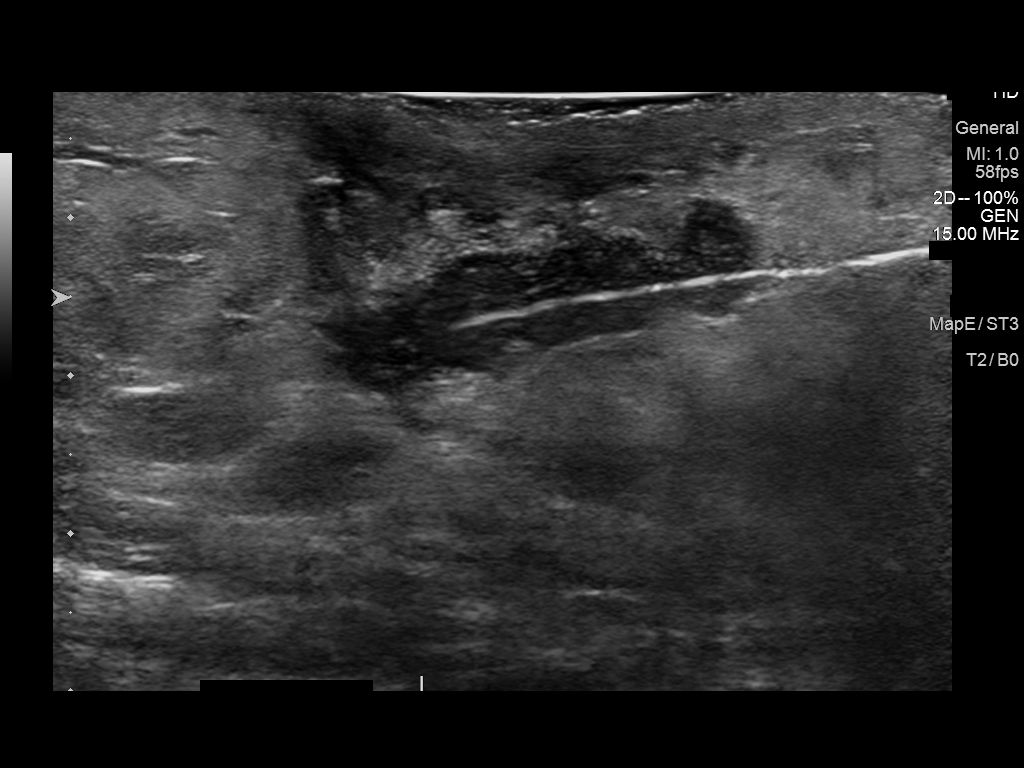
[im 3/5]
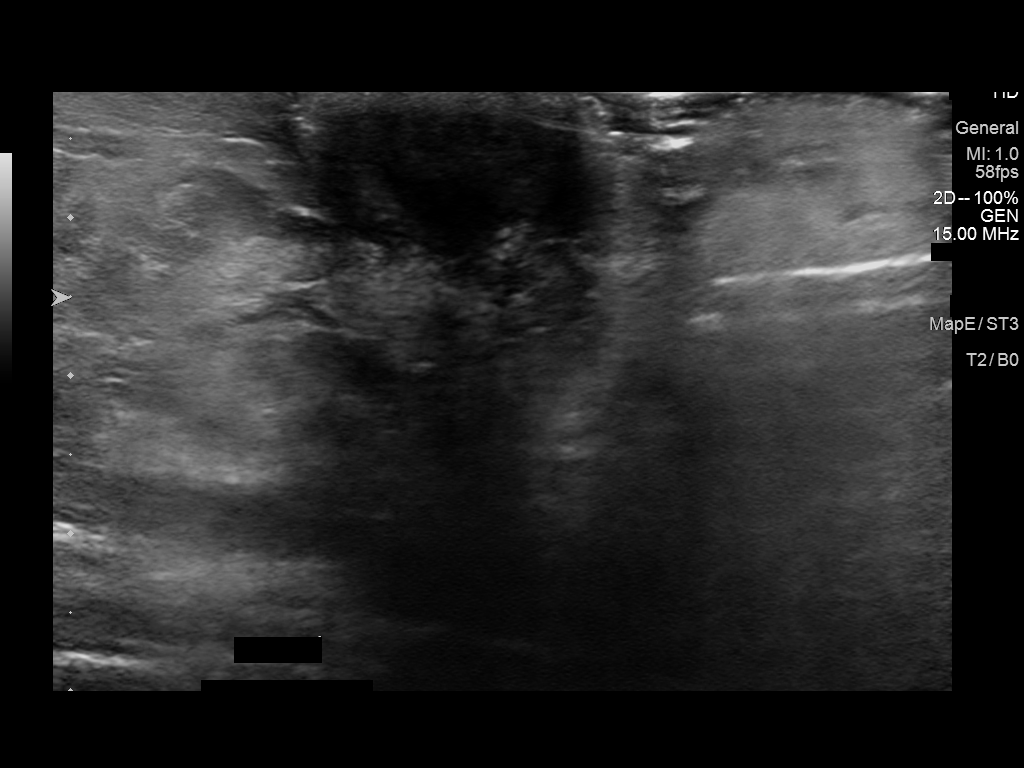
[im 4/5]
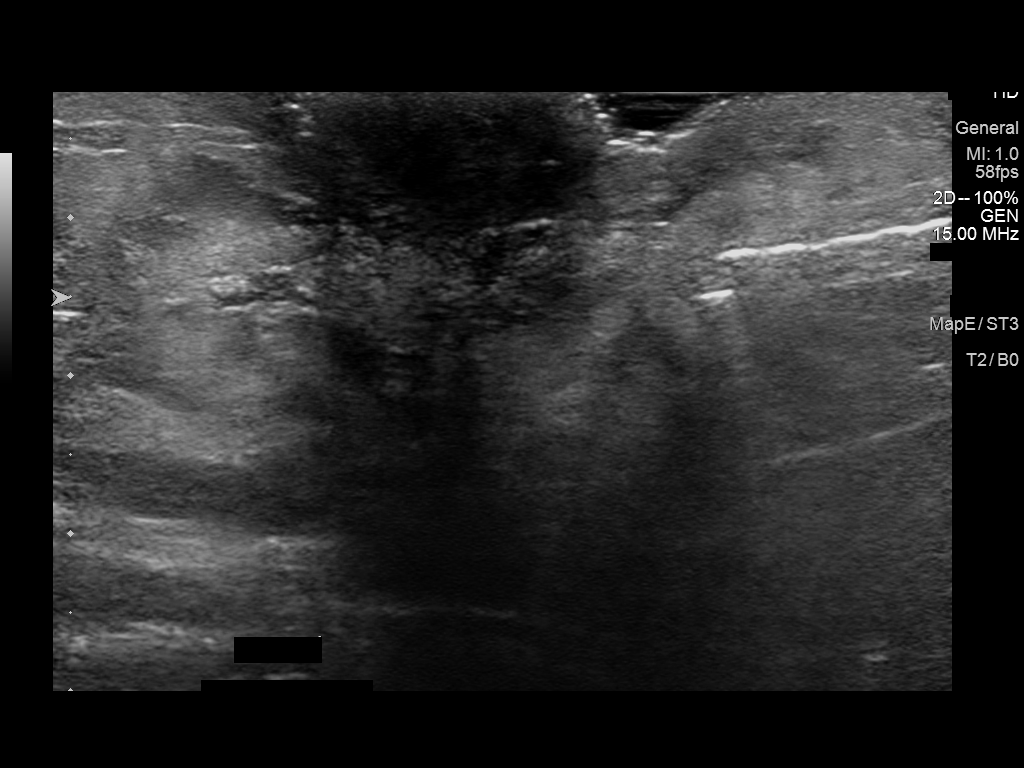
[im 5/5]
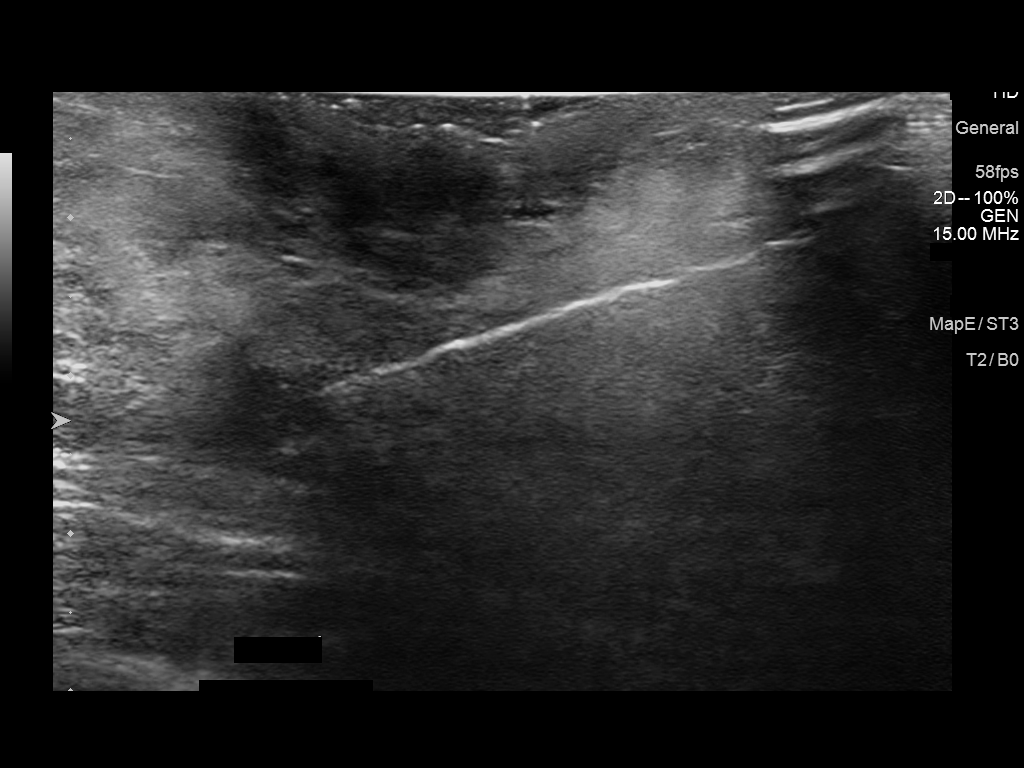

[5 of 5 positions shown; findings below may reference images not displayed]

PROCEDURE:
Using sterile technique, 1% lidocaine, under direct ultrasound
visualization, needle aspiration of abscess in the 12:30 o'clock
location of the LEFT breast was performed using a LATERAL approach.
5 mL of purulent bloody fluid was aspirated and discarded.
IMPRESSION: Ultrasound-guided aspiration of LEFT breast abscess no apparent
complications.

RECOMMENDATIONS:
Patient has a surgical consultation later today.

Follow-up LEFT breast ultrasound in 2 weeks.

Complete antibiotic course.

## 2019-05-10 ENCOUNTER — Ambulatory Visit
Admission: RE | Admit: 2019-05-10 | Discharge: 2019-05-10 | Disposition: A | Discharge status: Home / Self Care | Payer: 59 | Source: Ambulatory Visit | Attending: Radiation Oncology | Admitting: Radiation Oncology

## 2019-05-10 ENCOUNTER — Ambulatory Visit: Payer: Self-pay | Admitting: Radiation Oncology

## 2019-05-10 ENCOUNTER — Encounter: Payer: Self-pay | Admitting: Radiation Oncology

## 2019-05-10 ENCOUNTER — Other Ambulatory Visit: Payer: Self-pay

## 2019-05-10 VITALS — BP 124/85 | HR 79 | Temp 97.0°F | Resp 18 | Wt 153.4 lb

## 2019-05-10 DIAGNOSIS — C50012 Malignant neoplasm of nipple and areola, left female breast: Secondary | ICD-10-CM | POA: Diagnosis present

## 2019-05-10 DIAGNOSIS — Z79810 Long term (current) use of selective estrogen receptor modulators (SERMs): Secondary | ICD-10-CM | POA: Insufficient documentation

## 2019-05-10 DIAGNOSIS — Z170 Estrogen receptor positive status [ER+]: Secondary | ICD-10-CM | POA: Diagnosis not present

## 2019-05-10 DIAGNOSIS — Z923 Personal history of irradiation: Secondary | ICD-10-CM | POA: Diagnosis not present

## 2019-05-10 DIAGNOSIS — D0512 Intraductal carcinoma in situ of left breast: Secondary | ICD-10-CM

## 2019-05-10 NOTE — Progress Notes (Signed)
Radiation Oncology Follow up Note  Name: Holly Bennett   Date:   05/10/2019 MRN:  [id number] DOB: 08/10/1966    This 53 y.o. female presents to the clinic today for 1 year follow-up status post whole breast radiation to her left breast for stage I invasive mammary carcinoma ER PR positive.  REFERRING PROVIDER: Princess Bruins, MD  HPI: Patient is a 53 year old female now about 1 year having completed whole breast radiation to her left breast for stage I ER PR positive invasive mammary carcinoma.  Seen today in routine follow-up she is doing well.  She specifically denies breast tenderness cough or bone pain.  She is a small area where she had a postoperative complication around the nipple areole or complex it seems stable..  She had a mammogram back in December which I have reviewed was BI-RADS 2 benign.  She is currently on tamoxifen tolerating that well without side effect.  COMPLICATIONS OF TREATMENT: none  FOLLOW UP COMPLIANCE: keeps appointments   PHYSICAL EXAM:  BP 124/85   Pulse 79   Temp (!) 97 F (36.1 C)   Resp 18   Wt 153 lb 7 oz (69.6 kg)   BMI 25.93 kg/m  Lungs are clear to A&P cardiac examination essentially unremarkable with regular rate and rhythm. No dominant mass or nodularity is noted in either breast in 2 positions examined. Incision is well-healed. No axillary or supraclavicular adenopathy is appreciated. Cosmetic result is excellent.  Small area of slight erythema around the nipple areole complex corresponding to the area of prior postoperative complication.  Well-developed well-nourished patient in NAD. HEENT reveals PERLA, EOMI, discs not visualized.  Oral cavity is clear. No oral mucosal lesions are identified. Neck is clear without evidence of cervical or supraclavicular adenopathy. Lungs are clear to A&P. Cardiac examination is essentially unremarkable with regular rate and rhythm without murmur rub or thrill. Abdomen is benign with no organomegaly or masses  noted. Motor sensory and DTR levels are equal and symmetric in the upper and lower extremities. Cranial nerves II through XII are grossly intact. Proprioception is intact. No peripheral adenopathy or edema is identified. No motor or sensory levels are noted. Crude visual fields are within normal range.  RADIOLOGY RESULTS: Mammograms reviewed compatible with above-stated findings  PLAN: Present time patient continues to do well with no evidence of disease.  I am pleased with her overall progress.  She continues on tamoxifen.  I have asked to see her back in 1 year for follow-up.  Patient knows to call with any concerns.  I would like to take this opportunity to thank you for allowing me to participate in the care of your patient.Noreene Filbert, MD

## 2019-06-08 ENCOUNTER — Other Ambulatory Visit: Payer: Self-pay

## 2019-06-08 ENCOUNTER — Ambulatory Visit (INDEPENDENT_AMBULATORY_CARE_PROVIDER_SITE_OTHER): Payer: 59

## 2019-06-08 ENCOUNTER — Ambulatory Visit (INDEPENDENT_AMBULATORY_CARE_PROVIDER_SITE_OTHER): Payer: 59 | Admitting: Obstetrics & Gynecology

## 2019-06-08 DIAGNOSIS — N83202 Unspecified ovarian cyst, left side: Secondary | ICD-10-CM

## 2019-06-08 DIAGNOSIS — Z853 Personal history of malignant neoplasm of breast: Secondary | ICD-10-CM | POA: Diagnosis not present

## 2019-06-08 DIAGNOSIS — N8312 Corpus luteum cyst of left ovary: Secondary | ICD-10-CM

## 2019-06-08 DIAGNOSIS — Z170 Estrogen receptor positive status [ER+]: Secondary | ICD-10-CM

## 2019-06-08 DIAGNOSIS — C50112 Malignant neoplasm of central portion of left female breast: Secondary | ICD-10-CM

## 2019-06-08 NOTE — Progress Notes (Signed)
    Holly Bennett 01/28/1966 [id number]        53 y.o.  [id number]   RP: Left Ovarian cyst for f/u Pelvic US  HPI: No pelvic pain.  Breast Ca on Tamoxifen.  No vaginal bleeding.   OB History  Gravida Para Term Preterm AB Living  4 2     2 2   SAB TAB Ectopic Multiple Live Births  2            # Outcome Date GA Lbr Len/2nd Weight Sex Delivery Anes PTL Lv  4 SAB           3 SAB           2 Para           1 Para             Past medical history,surgical history, problem list, medications, allergies, family history and social history were all reviewed and documented in the EPIC chart.   Directed ROS with pertinent positives and negatives documented in the history of present illness/assessment and plan.  Exam:  There were no vitals filed for this visit. General appearance:  Normal  Pelvic US today: T/V images.  Retroverted uterus with bicornuate fundus, normal otherwise, measuring 8.34 x 5.76 x 5.07 cm.  No myometrial mass.  Endometrial lining symmetrical measuring 4.6 mm on the right side and 4.5 mm on the left side.  No mass or abnormal flow seen to the endometrium.  Right ovary normal and small.  Left ovary with 3 simple avascular cystic structure measuring 3.6 x 3.0 cm, 3.0 x 2.7 cm, 2.8 x 1.8 cm.  No free fluid in the posterior cul-de-sac.   Assessment/Plan:  53 y.o. [id number]   1. Left ovarian cyst No pelvic pain and no vaginal bleeding.  Pelvic ultrasound findings thoroughly reviewed with patient.  Left ovary with 3 simple avascular cystic structures measuring 3.6 x 3 cm, 3 x 2.7 cm, and 2.8 x 1.8 cm.  No free fluid in the posterior cul-de-sac.  Given the benign appearance of the 3 small simple probably functional left ovarian cysts in an asymptomatic patient, decision to observe.  We will repeat a pelvic ultrasound in 3 months.  Precautions given for ovarian cyst rupture and ovarian torsion. - US Transvaginal Non-OB; Future, in 3 months  2. Malignant neoplasm of central portion  of left breast in female, estrogen receptor positive ([place]) On Tamoxifen.  Counseling on above issues and coordination of care more than 50% for 15 minutes.  Princess Bruins MD, 2:52 PM 06/08/2019

## 2019-06-12 ENCOUNTER — Encounter: Payer: Self-pay | Admitting: Obstetrics & Gynecology

## 2019-06-12 NOTE — Patient Instructions (Signed)
1. Left ovarian cyst No pelvic pain and no vaginal bleeding.  Pelvic ultrasound findings thoroughly reviewed with patient.  Left ovary with 3 simple avascular cystic structures measuring 3.6 x 3 cm, 3 x 2.7 cm, and 2.8 x 1.8 cm.  No free fluid in the posterior cul-de-sac.  Given the benign appearance of the 3 small simple probably functional left ovarian cysts in an asymptomatic patient, decision to observe.  We will repeat a pelvic ultrasound in 3 months.  Precautions given for ovarian cyst rupture and ovarian torsion. - US Transvaginal Non-OB; Future, in 3 months  2. Malignant neoplasm of central portion of left breast in female, estrogen receptor positive ([place]) On Tamoxifen.  Diora, it was a pleasure seeing you today!

## 2019-09-07 ENCOUNTER — Ambulatory Visit: Payer: 59 | Admitting: Obstetrics & Gynecology

## 2019-09-07 ENCOUNTER — Ambulatory Visit (INDEPENDENT_AMBULATORY_CARE_PROVIDER_SITE_OTHER): Payer: PRIVATE HEALTH INSURANCE

## 2019-09-07 ENCOUNTER — Other Ambulatory Visit: Payer: Self-pay

## 2019-09-07 ENCOUNTER — Encounter: Payer: Self-pay | Admitting: Obstetrics & Gynecology

## 2019-09-07 DIAGNOSIS — N83202 Unspecified ovarian cyst, left side: Secondary | ICD-10-CM

## 2019-09-07 NOTE — Progress Notes (Signed)
    Holly Bennett 1966-03-19 [id number]        53 y.o.  [id number]   RP: F/U Left Ovarian Cysts with a Pelvic US  HPI: No Pelvic Pain.  Breast Ca on Tamoxifen.  No vaginal bleeding.   OB History  Gravida Para Term Preterm AB Living  4 2     2 2   SAB TAB Ectopic Multiple Live Births  2            # Outcome Date GA Lbr Len/2nd Weight Sex Delivery Anes PTL Lv  4 SAB           3 SAB           2 Para           1 Para             Past medical history,surgical history, problem list, medications, allergies, family history and social history were all reviewed and documented in the EPIC chart.   Directed ROS with pertinent positives and negatives documented in the history of present illness/assessment and plan.  Exam:  There were no vitals filed for this visit. General appearance:  Normal  Pelvic US today: T/V images.  Comparison made with previous scan June 08, 2019.  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  The uterus is measured at 8.4 x 5.01 x 4.54 cm.  The endometrial lining is thin and symmetrical at 3.47 mm.  Left horn at 3.9 mm and right horn at 3.0 mm.  No mass or thickening of the endometrium is seen.  The left ovary is normal, previous left ovarian cysts have resolved.  Right ovary with a thin-walled, avascular simple cyst measured at 3.2 x 2.9 cm.  No free fluid in the posterior cul-de-sac.  Nontender vaginal exam.  Resolved Left Ovarian Cysts.   Assessment/Plan:  53 y.o. [id number]   1. Left ovarian cyst Pelvic ultrasound findings reviewed with patient.  Left Ovarian Cysts resolved.  Small simple functional cyst on the right ovary measured at 3.2 x 2.9 cm.  Patient reassured.  Follow-up at next annual gynecologic exam.  Counseling on above issues and coordination of care more than 50% for 15 minutes.  Princess Bruins MD, 4:07 PM 09/07/2019

## 2019-09-07 NOTE — Patient Instructions (Signed)
1. Left ovarian cyst Pelvic ultrasound findings reviewed with patient.  Left Ovarian Cysts resolved.  Small simple functional cyst on the right ovary measured at 3.2 x 2.9 cm.  Patient reassured.  Follow-up at next annual gynecologic exam.  Holly Bennett, it was a pleasure seeing you today!

## 2019-09-19 ENCOUNTER — Other Ambulatory Visit: Payer: Self-pay

## 2019-09-19 DIAGNOSIS — Z20822 Contact with and (suspected) exposure to covid-19: Secondary | ICD-10-CM

## 2019-09-20 LAB — NOVEL CORONAVIRUS, NAA: SARS-CoV-2, NAA: NOT DETECTED

## 2019-09-26 DIAGNOSIS — Z853 Personal history of malignant neoplasm of breast: Secondary | ICD-10-CM

## 2019-10-01 NOTE — Progress Notes (Signed)
[place]  Telephone:([phone]) [phone] Fax:(336) [phone]  ID: Holly Bennett OB: 1966-09-22  MR#: [id number]  TDV#:[id number]  Patient Care Team: Princess Bruins, MD as PCP - General (Obstetrics and Gynecology)  I connected with Holly Bennett on 10/03/19 at 10:15 AM EST by video enabled telemedicine visit and verified that I am speaking with the correct person using two identifiers.   I discussed the limitations, risks, security and privacy concerns of performing an evaluation and management service by telemedicine and the availability of in-person appointments. I also discussed with the patient that there may be a patient responsible charge related to this service. The patient expressed understanding and agreed to proceed.   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  INTERVAL HISTORY: Patient agreed to video enabled telemedicine visit for her routine 58-monthevaluation.  She currently feels well and is asymptomatic.  She is tolerating tamoxifen without significant side effects. She has no neurologic complaints.  She denies any recent fevers or illnesses.  She has a good appetite and denies weight loss.  She denies any chest pain, shortness of breath, cough, or hemoptysis.  She denies any nausea, vomiting, constipation, or diarrhea.  She has no urinary complaints.  Patient feels at her baseline offers no specific complaints today.  REVIEW OF SYSTEMS:   Review of Systems  Constitutional: Negative.  Negative for fever, malaise/fatigue and weight loss.  Respiratory: Negative.  Negative for cough and shortness of breath.   Cardiovascular: Negative.  Negative for chest pain and leg swelling.  Gastrointestinal: Negative.  Negative for abdominal pain.   Genitourinary: Negative.  Negative for dysuria.  Musculoskeletal: Negative.  Negative for back pain and joint pain.  Skin: Negative.  Negative for rash.  Neurological: Negative.  Negative for dizziness, sensory change, focal weakness and weakness.  Psychiatric/Behavioral: Negative.  The patient is not nervous/anxious.     As per HPI. Otherwise, a complete review of systems is negative.  PAST MEDICAL HISTORY: Past Medical History:  Diagnosis Date  . Acute right flank pain 01/21/2011  . Back pain 01/21/2011  . Cancer of central [place] of left female breast [place] 10/23/2017   also skin cancer on calf(5 yrs ago) and cheek (august 2018)  . Complication of anesthesia   . Heart murmur    found once many years ago  . 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 History of kidney stones    passed twice  . Personal history of other malignant neoplasm of skin 06/10/2017  . Personal history of radiation therapy    LEFFT lumpectomy  . PONV (postoperative nausea and vomiting)   . Viral warts, unspecified     PAST SURGICAL HISTORY: Past Surgical History:  Procedure Laterality Date  . APPENDECTOMY  1983  . AXILLARY SENTINEL NODE BIOPSY Left 11/17/2017   Procedure: AXILLARY SENTINEL NODE BIOPSY;  Surgeon: POlean Ree MD;  Location: [facility] ORS;  Service: General;  Laterality: Left;  . BREAST BIOPSY Left 10/15/2017   INVASIVE MAMMARY CARCINOMA  . BREAST LUMPECTOMY Left 11/17/2017   INVASIVE MAMMARY CARCINOMA  . BREAST LUMPECTOMY WITH NEEDLE LOCALIZATION Left 11/17/2017   Procedure: BREAST LUMPECTOMY WITH NEEDLE LOCALIZATION;  Surgeon: POlean Ree MD;  Location: [facility] ORS;  Service: General;  Laterality: Left;  . COLONOSCOPY WITH PROPOFOL  N/A 11/04/2018   Procedure: COLONOSCOPY WITH PROPOFOL;  Surgeon: Jonathon Bellows, MD;  Location[facility] END[facility]COPY;  Service: Gastroenterology;  Laterality: N/A;  . DILATION AND CURETTAGE OF UTERUS     x4  . MOHS SURGERY  2018  . WISDOM TOOTH  EXTRACTION      FAMILY HISTORY: Family History  Problem Relation Age of Onset  . Breast cancer Mother 69       currently 57  . Hypertension Father   . Lung cancer Father 4       smoker; deceased 31  . Breast cancer Other        mat grandmother's sister    ADVANCED DIRECTIVES (Y/N):  N  HEALTH MAINTENANCE: Social History   Tobacco Use  . Smoking status: Never Smoker  . Smokeless tobacco: Never Used  Substance Use Topics  . Alcohol use: Yes    Comment: Socially on weekends  . Drug use: No     Colonoscopy:  PAP:  Bone density:  Lipid panel:  Allergies  Allergen Reactions  . Blue Dyes (Parenteral) Dermatitis  . Penicillins Rash and Other (See Comments)    Has patient had a PCN reaction causing immediate rash, facial/tongue/throat swelling, SOB or lightheadedness with hypotension: Unknown Has patient had a PCN reaction causing severe rash involving mucus membranes or skin necrosis: No Has patient had a PCN reaction that required hospitalization: No Has patient had a PCN reaction occurring within the last 10 years: No If all of the above answers are "NO", then may proceed with Cephalosporin use.     Current Outpatient Medications  Medication Sig Dispense Refill  . Ascorbic Acid (VITAMIN C) 500 MG tablet Take 500 mg by mouth daily.      . Calcium Carbonate-Vitamin D (CALCIUM 600+D) 600-400 MG-UNIT per tablet Take 1 tablet by mouth daily.      . Collagen Hydrolysate POWD by Does not apply route.    . Glucosamine 500 MG CAPS Take 500 mg by mouth daily.     . Multiple Vitamin (MULTIVITAMIN) tablet Take 1 tablet by mouth daily.      . tamoxifen (NOLVADEX) 20 MG tablet TAKE 1 TABLET BY MOUTH EVERY DAY 90 tablet 3  . vitamin B-12 (CYANOCOBALAMIN) 100 MCG tablet Take 100 mcg by mouth daily.     No current facility-administered medications for this visit.     OBJECTIVE: There were no vitals filed for this visit.   There is no height or weight on file to calculate BMI.     ECOG FS:0 - Asymptomatic  General: Well-developed, well-nourished, no acute distress. HEENT: Normocephalic. Neuro: Alert, answering all questions appropriately. Cranial nerves grossly intact. Psych: Normal affect.  LAB RESULTS:  No results found for: NA, K, CL, CO2, GLUCOSE, BUN, CREATININE, CALCIUM, PROT, ALBUMIN, AST, ALT, ALKPHOS, BILITOT, GFRNONAA, GFRAA  Lab Results  Component Value Date   WBC 6.7 03/28/2018   HGB 12.5 03/28/2018   HCT 36.0 03/28/2018   MCV 88.6 03/28/2018   PLT 244 03/28/2018     STUDIES: US Transvaginal Non-ob  Result Date: 09/25/2019 T/V images.  Comparison made with previous scan June 08, 2019.  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  The uterus is measured at 8.4 x 5.01 x 4.54 cm.  The endometrial lining is thin and symmetrical at 3.47 mm.  Left horn at 3.9 mm and right horn at 3.0 mm.  No mass or thickening of the endometrium is seen.  The left ovary is normal, previous left  ovarian cysts have resolved.  Right ovary with a thin-walled, avascular simple cyst measured at 3.2 x 2.9 cm.  No free fluid in the posterior cul-de-sac.  Nontender vaginal exam.  Resolved Left Ovarian Cysts.    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  Oncotype DX score 16, which is considered low risk.   PLAN:    1. 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  She had difficulty with wound healing possibly secondary to her methylene blue injection for sentinel node biopsy.  She did not require adjuvant chemotherapy given her low risk Oncotype.  Patient completed adjuvant XRT on April 06, 2018.  Continue tamoxifen for total of 5 years completing treatment in June 2024.  Her most recent mammogram on September 30, 2018 was reported as BI-RADS 2.  She  has a scheduled mammogram in approximately 1 week.  Return to clinic in 6 months for routine evaluation.     2.  Genetic testing: Appreciate genetic counseling input.  Patient was noted to have a variant of unknown significance.  I provided 15 minutes of face-to-face video visit time during this encounter, and > 50% was spent counseling as documented under my assessment & plan.   Patient expressed understanding and was in agreement with this plan. She also understands that She can call clinic at any time with any questions, concerns, or complaints.   Cancer Staging Malignant neoplasm of left breast in female, estrogen receptor positive ([place]) Staging form: Breast, AJCC 8th Edition - Clinical stage from 10/23/2017: Stage IA (cT1a, cN0, cM0, G2, ER: Positive, PR: Positive, HER2: Negative) - Signed by Lloyd Huger, MD on 12/09/2017   Lloyd Huger, MD   10/03/2019 6:22 AM

## 2019-10-02 ENCOUNTER — Inpatient Hospital Stay: Payer: PRIVATE HEALTH INSURANCE | Attending: Oncology | Admitting: Oncology

## 2019-10-02 DIAGNOSIS — Z170 Estrogen receptor positive status [ER+]: Secondary | ICD-10-CM

## 2019-10-02 DIAGNOSIS — C50112 Malignant neoplasm of central portion of left female breast: Secondary | ICD-10-CM

## 2019-10-02 NOTE — Progress Notes (Signed)
P doing VV today for follow up, reports that she feels like she's tolerating the tomoxifen better than she was. No specific questions or concerns today.

## 2019-10-11 ENCOUNTER — Ambulatory Visit
Admission: RE | Admit: 2019-10-11 | Discharge: 2019-10-11 | Disposition: A | Discharge status: Home / Self Care | Payer: PRIVATE HEALTH INSURANCE | Source: Ambulatory Visit | Attending: Obstetrics & Gynecology | Admitting: Obstetrics & Gynecology

## 2019-10-11 DIAGNOSIS — Z853 Personal history of malignant neoplasm of breast: Secondary | ICD-10-CM | POA: Diagnosis not present

## 2019-10-24 ENCOUNTER — Encounter: Payer: 59 | Admitting: Obstetrics & Gynecology

## 2019-10-25 ENCOUNTER — Encounter: Payer: PRIVATE HEALTH INSURANCE | Admitting: Obstetrics & Gynecology

## 2020-01-05 ENCOUNTER — Ambulatory Visit: Payer: PRIVATE HEALTH INSURANCE | Attending: Internal Medicine

## 2020-01-05 DIAGNOSIS — Z23 Encounter for immunization: Secondary | ICD-10-CM

## 2020-01-05 NOTE — Progress Notes (Signed)
   Covid-19 Vaccination Clinic  Name:  Holly Bennett    MRN: [id number] DOB: 02/03/1966  01/05/2020  [name] was observed post Covid-19 immunization for 15 minutes without incident. She was provided with Vaccine Information Sheet and instruction to access the V-Safe system.   [name] was instructed to call 911 with any severe reactions post vaccine: Marland Kitchen Difficulty breathing  . Swelling of face and throat  . A fast heartbeat  . A bad rash all over body  . Dizziness and weakness   Immunizations Administered    Name Date Dose VIS Date Route   Moderna COVID-19 Vaccine 01/05/2020 10:43 AM 0.5 mL 09/26/2019 Intramuscular   Manufacturer: Moderna   Lot: [id number]   [id number]

## 2020-01-11 ENCOUNTER — Other Ambulatory Visit: Payer: Self-pay

## 2020-01-11 ENCOUNTER — Encounter: Payer: Self-pay | Admitting: Dermatology

## 2020-01-11 ENCOUNTER — Ambulatory Visit: Payer: PRIVATE HEALTH INSURANCE | Admitting: Dermatology

## 2020-01-11 DIAGNOSIS — L2489 Irritant contact dermatitis due to other agents: Secondary | ICD-10-CM | POA: Diagnosis not present

## 2020-01-11 DIAGNOSIS — L309 Dermatitis, unspecified: Secondary | ICD-10-CM | POA: Diagnosis not present

## 2020-01-11 MED ORDER — CLOBETASOL PROP EMOLLIENT BASE 0.05 % EX CREA: TOPICAL_CREAM | CUTANEOUS | 0 refills | Status: DC

## 2020-01-11 NOTE — Patient Instructions (Signed)
Mild soap with handwashing followed by moisturizer.

## 2020-01-11 NOTE — Progress Notes (Signed)
   Follow-Up Visit   Subjective  Holly Bennett is a 54 y.o. female who presents for the following: Rash (left hand x 2 wks.). Rash She is on medications as noted and overall her rash has worsened slightly. Her rash affects left hand. She has been experiencing itching. Overall, symptoms have been moderate.  No known plant exposure, hasn't worked out in yard   The following portions of the chart were reviewed this encounter and updated as appropriate:     Review of Systems: No other skin or systemic complaints.  Objective  Well appearing patient in no apparent distress; mood and affect are within normal limits.  A focused examination was performed including left hand. Relevant physical exam findings are noted in the Assessment and Plan.  Objective  Left Thumb, MCP, thenar hand: Pink scaly patch.  Assessment & Plan  Eczema, unspecified type  Ordered Medications: Clobetasol Prop Emollient Base (CLOBETASOL PROPIONATE E) 0.05 % emollient cream  Irritant contact dermatitis due to other agents Left Thumb, MCP, thenar hand  Mild soap with handwashing followed by moisturizer. Start clobetasol cream Apply to AA rash on hand BID until improved, avoid face, groin, underarms dsp 30g 0Rf.  Return in about 2 months (around 03/12/2020) for TBSE and Botox (glabella 20 units), Also schedule appt for sclerotherapy.Helen Hashimoto, CMA, am acting as scribe for Brendolyn Patty, MD .

## 2020-02-07 ENCOUNTER — Ambulatory Visit: Payer: PRIVATE HEALTH INSURANCE | Attending: Internal Medicine

## 2020-02-07 DIAGNOSIS — Z23 Encounter for immunization: Secondary | ICD-10-CM

## 2020-02-07 NOTE — Progress Notes (Signed)
   Covid-19 Vaccination Clinic  Name:  Holly Bennett    MRN: [id number] DOB: 1966/09/29  02/07/2020  Holly Bennett was observed post Covid-19 immunization for 15 minutes without incident. She was provided with Vaccine Information Sheet and instruction to access the V-Safe system.   Holly Bennett was instructed to call 911 with any severe reactions post vaccine: Marland Kitchen Difficulty breathing  . Swelling of face and throat  . A fast heartbeat  . A bad rash all over body  . Dizziness and weakness   Immunizations Administered    Name Date Dose VIS Date Route   Moderna COVID-19 Vaccine 02/07/2020  9:12 AM 0.5 mL 09/26/2019 Intramuscular   Manufacturer: Moderna   Lot: [id number]   [id number]

## 2020-02-26 ENCOUNTER — Other Ambulatory Visit: Payer: Self-pay

## 2020-02-26 ENCOUNTER — Ambulatory Visit (INDEPENDENT_AMBULATORY_CARE_PROVIDER_SITE_OTHER): Payer: Self-pay | Admitting: Dermatology

## 2020-02-26 DIAGNOSIS — I8393 Asymptomatic varicose veins of bilateral lower extremities: Secondary | ICD-10-CM

## 2020-02-26 NOTE — Progress Notes (Signed)
   Follow-Up Visit   Subjective  Holly Bennett is a 54 y.o. female who presents for the following: Procedure.  Pt is here for sclerotherapy of spider veins.  She had it done in past with improvement noted, but small veins remain.   The following portions of the chart were reviewed this encounter and updated as appropriate:      Review of Systems:  No other skin or systemic complaints except as noted in HPI or Assessment and Plan.  Objective  Well appearing patient in no apparent distress; mood and affect are within normal limits.  A focused examination was performed including lower legs. Relevant physical exam findings are noted in the Assessment and Plan.  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  Procedure: The patient was counseled and understands about the effects, side effects and potential risks and complications of the sclerotherapy procedure. The patient was given the opportunity to ask questions. Asclera (polidocanol) 1% (total 2cc) was injected into the varices. In order to ensure correct placement of the catheter in the vein, I drew back slightly to give moderate blood show in the syringe. If there was any evidence or suspicion of extravasation of sclerosant, the area was immediately diluted with a large volume of 0.9% saline. A pressure dressing was applied immediately to the injected sites. The patient tolerated the procedure well without complication. The patient was instructed in post-operative compression stocking use. The patient understands to call or return immediately if any problems noted.   Return for  appointment as scheduled.   IJamesetta Orleans, CMA, am acting as scribe for Brendolyn Patty, MD .    Documentation: I have reviewed the above documentation for accuracy and completeness, and I agree with the above.  Brendolyn Patty, MD

## 2020-03-19 ENCOUNTER — Ambulatory Visit: Payer: PRIVATE HEALTH INSURANCE | Admitting: Dermatology

## 2020-03-19 ENCOUNTER — Other Ambulatory Visit: Payer: Self-pay

## 2020-03-19 DIAGNOSIS — D2371 Other benign neoplasm of skin of right lower limb, including hip: Secondary | ICD-10-CM | POA: Diagnosis not present

## 2020-03-19 DIAGNOSIS — D1801 Hemangioma of skin and subcutaneous tissue: Secondary | ICD-10-CM

## 2020-03-19 DIAGNOSIS — I781 Nevus, non-neoplastic: Secondary | ICD-10-CM

## 2020-03-19 DIAGNOSIS — Z1283 Encounter for screening for malignant neoplasm of skin: Secondary | ICD-10-CM | POA: Diagnosis not present

## 2020-03-19 DIAGNOSIS — L988 Other specified disorders of the skin and subcutaneous tissue: Secondary | ICD-10-CM

## 2020-03-19 DIAGNOSIS — L578 Other skin changes due to chronic exposure to nonionizing radiation: Secondary | ICD-10-CM

## 2020-03-19 DIAGNOSIS — D225 Melanocytic nevi of trunk: Secondary | ICD-10-CM

## 2020-03-19 DIAGNOSIS — Z85828 Personal history of other malignant neoplasm of skin: Secondary | ICD-10-CM

## 2020-03-19 DIAGNOSIS — D485 Neoplasm of uncertain behavior of skin: Secondary | ICD-10-CM | POA: Diagnosis not present

## 2020-03-19 DIAGNOSIS — L720 Epidermal cyst: Secondary | ICD-10-CM

## 2020-03-19 DIAGNOSIS — L814 Other melanin hyperpigmentation: Secondary | ICD-10-CM

## 2020-03-19 DIAGNOSIS — D239 Other benign neoplasm of skin, unspecified: Secondary | ICD-10-CM

## 2020-03-19 DIAGNOSIS — D229 Melanocytic nevi, unspecified: Secondary | ICD-10-CM

## 2020-03-19 DIAGNOSIS — L821 Other seborrheic keratosis: Secondary | ICD-10-CM

## 2020-03-19 NOTE — Progress Notes (Signed)
Follow-Up Visit   Subjective  Holly Bennett is a 54 y.o. female who presents for the following: Annual Exam.  Patient here today for TBSE. She does have a history of BCC. Patient not aware of any new or changing spots. She is also here for Botox in the glabella.   The following portions of the chart were reviewed this encounter and updated as appropriate:     Review of Systems:  No other skin or systemic complaints except as noted in HPI or Assessment and Plan.  Objective  Well appearing patient in no apparent distress; mood and affect are within normal limits.  A full examination was performed including scalp, head, eyes, ears, nose, lips, neck, chest, axillae, abdomen, back, buttocks, bilateral upper extremities, bilateral lower extremities, hands, feet, fingers, toes, fingernails, and toenails. All findings within normal limits unless otherwise noted below.  Objective  Right Medial Thigh: Firm pink/brown papulenodule with dimple sign.   Objective  Glabella: Rhytides and volume loss.   Images          Objective  Left cheek: Well healed scar with no evidence of recurrence.   Objective  Left Paranasal: Smooth white papule(s).   Objective  Left Shoulder - Anterior: 6 x 65mm flesh smooth papules appears as 2 adjacent     Objective  Right Lateral Breast: 5 x 61mm medium dark brown macule, present for years without change  Objective  Right Thigh - Anterior: Telangiectasias    Assessment & Plan  Dermatofibroma Right Medial Thigh  Benign-appearing.  Observation.  Call clinic for new or changing moles.  Recommend daily use of broad spectrum spf 30+ sunscreen to sun-exposed areas.    Elastosis of skin Glabella  Botox Injection - Glabella Location: See attached image  Informed consent: Discussed risks (infection, pain, bleeding, bruising, swelling, allergic reaction, paralysis of nearby muscles, eyelid droop, double vision, neck weakness, difficulty  breathing, headache, undesirable cosmetic result, and need for additional treatment) and benefits of the procedure, as well as the alternatives.  Informed consent was obtained.  Preparation: The area was cleansed with alcohol.  Procedure Details:  Botox was injected into the dermis with a 30-gauge needle. Pressure applied to any bleeding. Ice packs offered for swelling.  Lot Number:  [id number] Expiration:  04/2022  Total Units Injected:  20  Plan: Patient was instructed to remain upright for 4 hours. Patient was instructed to avoid massaging the face and avoid vigorous exercise for the rest of the day. Tylenol may be used for headache.  Allow 2 weeks before returning to clinic for additional dosing as needed. Patient will call for any problems.   History of basal cell carcinoma (BCC) Left cheek  Clear. Observe for recurrence. Call clinic for new or changing lesions.  Recommend regular skin exams, daily broad-spectrum spf 30+ sunscreen use, and photoprotection.     Milia Left Paranasal  Benign, observe.   Samples of Altrena gel given to use QHS. Lot [id number]  Exp: 05/2020   Neoplasm of uncertain behavior of skin Left Shoulder - Anterior  Skin / nail biopsy Type of biopsy: tangential   Informed consent: discussed and consent obtained   Patient was prepped and draped in usual sterile fashion: Area prepped with alcohol. Anesthesia: the lesion was anesthetized in a standard fashion   Anesthetic:  1% lidocaine w/ epinephrine 1-100,000 buffered w/ 8.4% NaHCO3 Instrument used: flexible razor blade   Hemostasis achieved with: pressure, aluminum chloride and electrodesiccation   Outcome: patient tolerated procedure  well   Post-procedure details: wound care instructions given   Post-procedure details comment:  Ointment and small bandage applied  Specimen 1 - Surgical pathology Differential Diagnosis: SK vs Nevus r/o BCC Check Margins: No 6 x 24mm flesh smooth papules appears as 2  adjacent  Nevus Right Lateral Breast  Benign-appearing.  Observation.  Call clinic for new or changing moles.  Recommend daily use of broad spectrum spf 30+ sunscreen to sun-exposed areas.    Telangiectasia Right Thigh - Anterior  S/P sclerotherapy, slowly improving   Lentigines - Scattered tan macules - Discussed due to sun exposure - Benign, observe - Call for any changes  Seborrheic Keratoses - Stuck-on, waxy, tan-brown papules and plaques  - Discussed benign etiology and prognosis. - Observe - Call for any changes  Melanocytic Nevi - Tan-brown and/or pink-flesh-colored symmetric macules and papules - Benign appearing on exam today - Observation - Call clinic for new or changing moles - Recommend daily use of broad spectrum spf 30+ sunscreen to sun-exposed areas.   Hemangiomas - Red papules - Discussed benign nature - Observe - Call for any changes  Actinic Damage - diffuse scaly erythematous macules with underlying dyspigmentation - Recommend daily broad spectrum sunscreen SPF 30+ to sun-exposed areas, reapply every 2 hours as needed.  - Call for new or changing lesions.  Skin cancer screening performed today.  Return in about 1 year (around 03/19/2021) for TBSE.  Graciella Belton, RMA, am acting as scribe for Brendolyn Patty, MD .  Documentation: I have reviewed the above documentation for accuracy and completeness, and I agree with the above.  Brendolyn Patty MD

## 2020-03-19 NOTE — Patient Instructions (Addendum)

## 2020-03-26 ENCOUNTER — Telehealth: Payer: Self-pay

## 2020-03-26 NOTE — Telephone Encounter (Signed)
Advised pt of bx results/sh ?

## 2020-03-26 NOTE — Telephone Encounter (Signed)
-----   Message from Brendolyn Patty, MD sent at 03/26/2020  1:10 PM EDT ----- Skin , left shoulder anterior MELANOCYTIC NEVUS, INTRADERMAL TYPE  Benign mole, no further treatment needed

## 2020-03-29 NOTE — Progress Notes (Signed)
[place]  [phone] [phone]) [phone]  ID: Holly Bennett OB: 1966-02-26  MR#: [id number]  ZWC#:[id number]  Patient Care Team: Princess Bruins, MD as PCP - General (Obstetrics and Gynecology)  I connected with Holly Bennett on 03/29/20 at  2:00 PM EDT by video enabled telemedicine visit and verified that I am speaking with the correct person using two identifiers.   I discussed the limitations, risks, security and privacy concerns of performing an evaluation and management service by telemedicine and the availability of in-person appointments. I also discussed with the patient that there may be a patient responsible charge related to this service. The patient expressed understanding and agreed to proceed.   Other persons participating in the visit and their role in the encounter: Patient, MD.  Patient's location: Home. Provider's location: Clinic.  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  INTERVAL HISTORY: Patient agreed to video enabled telemedicine visit for routine 44-monthevaluation.  She continues to feel well remains asymptomatic.  She continues to tolerate tamoxifen without significant side effects. She has no neurologic complaints.  She denies any recent fevers or illnesses.  She has a good appetite and denies weight loss.  She denies any chest pain, shortness of breath, cough, or hemoptysis.  She denies any nausea, vomiting, constipation, or diarrhea.  She has no urinary complaints.  Patient offers no specific complaints today.  REVIEW OF SYSTEMS:   Review of Systems  Constitutional: Negative.  Negative for fever, malaise/fatigue and weight loss.  Respiratory: Negative.  Negative for cough and shortness of breath.   Cardiovascular: Negative.  Negative for chest pain and leg swelling.  Gastrointestinal: Negative.  Negative for abdominal pain.   Genitourinary: Negative.  Negative for dysuria.  Musculoskeletal: Negative.  Negative for back pain and joint pain.  Skin: Negative.  Negative for rash.  Neurological: Negative.  Negative for dizziness, sensory change, focal weakness and weakness.  Psychiatric/Behavioral: Negative.  The patient is not nervous/anxious.     As per HPI. Otherwise, a complete review of systems is negative.  PAST MEDICAL HISTORY: Past Medical History:  Diagnosis Date  . Acute right flank pain 01/21/2011  . Back pain 01/21/2011  . Basal cell carcinoma 02/08/2017   left cheek inferior zygoma  . [place] of [place]/23[place]   also skin cancer on calf(5 yrs ago) and cheek (august 2018)  . Complication of anesthesia   . Heart murmur    found once many years ago  . 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 History of kidney stones    passed twice  . Personal history of other malignant neoplasm of skin 06/10/2017  . Personal history of radiation therapy    LEFFT lumpectomy  . PONV (postoperative nausea and vomiting)   . Viral warts, unspecified     PAST SURGICAL HISTORY: Past Surgical History:  Procedure Laterality Date  . APPENDECTOMY  1983  . AXILLARY SENTINEL NODE BIOPSY Left 11/17/2017   Procedure: AXILLARY SENTINEL NODE BIOPSY;  Surgeon: POlean Ree MD;  Location: [facility] ORS;  Service: General;  Laterality: Left;  . BREAST BIOPSY Left 10/15/2017   INVASIVE MAMMARY CARCINOMA  . BREAST LUMPECTOMY Left 11/17/2017   INVASIVE MAMMARY CARCINOMA  . BREAST LUMPECTOMY WITH NEEDLE LOCALIZATION Left 11/17/2017   Procedure: BREAST LUMPECTOMY WITH NEEDLE LOCALIZATION;  Surgeon: POlean Ree MD;  Location: [facility] ORS;  Service:  General;  Laterality: Left;  . COLONOSCOPY WITH PROPOFOL N/A 11/04/2018   Procedure: COLONOSCOPY WITH PROPOFOL;  Surgeon: Jonathon Bellows, MD;  Location: [facility]COPY;  Service: Gastroenterology;  Laterality: N/A;  . DILATION AND  CURETTAGE OF UTERUS     x4  . MOHS SURGERY  2018  . WISDOM TOOTH EXTRACTION      FAMILY HISTORY: Family History  Problem Relation Age of Onset  . Breast cancer Mother 67       currently 19  . Hypertension Father   . Lung cancer Father 47       smoker; deceased 33  . Breast cancer Other        mat grandmother's sister    ADVANCED DIRECTIVES (Y/N):  N  HEALTH MAINTENANCE: Social History   Tobacco Use  . Smoking status: Never Smoker  . Smokeless tobacco: Never Used  Substance Use Topics  . Alcohol use: Yes    Comment: Socially on weekends  . Drug use: No     Colonoscopy:  PAP:  Bone density:  Lipid panel:  Allergies  Allergen Reactions  . Blue Dyes (Parenteral) Dermatitis  . Penicillins Rash and Other (See Comments)    Has patient had a PCN reaction causing immediate rash, facial/tongue/throat swelling, SOB or lightheadedness with hypotension: Unknown Has patient had a PCN reaction causing severe rash involving mucus membranes or skin necrosis: No Has patient had a PCN reaction that required hospitalization: No Has patient had a PCN reaction occurring within the last 10 years: No If all of the above answers are "NO", then may proceed with Cephalosporin use.     Current Outpatient Medications  Medication Sig Dispense Refill  . Ascorbic Acid (VITAMIN C) 500 MG tablet Take 500 mg by mouth daily.      . Calcium Carbonate-Vitamin D (CALCIUM 600+D) 600-400 MG-UNIT per tablet Take 1 tablet by mouth daily.      . Clobetasol Prop Emollient Base (CLOBETASOL PROPIONATE E) 0.05 % emollient cream Apply to affected area rash on hand twice a day until improved. Avoid face, groin, underarms. 30 g 0  . Collagen Hydrolysate POWD by Does not apply route.    . Glucosamine 500 MG CAPS Take 500 mg by mouth daily.     . Multiple Vitamin (MULTIVITAMIN) tablet Take 1 tablet by mouth daily.      . tamoxifen (NOLVADEX) 20 MG tablet TAKE 1 TABLET BY MOUTH EVERY DAY 90 tablet 3  . vitamin  B-12 (CYANOCOBALAMIN) 100 MCG tablet Take 100 mcg by mouth daily.     No current facility-administered medications for this visit.    OBJECTIVE: There were no vitals filed for this visit.   There is no height or weight on file to calculate BMI.    ECOG FS:0 - Asymptomatic  General: Well-developed, well-nourished, no acute distress. HEENT: Normocephalic. Neuro: Alert, answering all questions appropriately. Cranial nerves grossly intact. Psych: Normal affect.   LAB RESULTS:  No results found for: NA, K, CL, CO2, GLUCOSE, BUN, CREATININE, CALCIUM, PROT, ALBUMIN, AST, ALT, ALKPHOS, BILITOT, GFRNONAA, GFRAA  Lab Results  Component Value Date   WBC 6.7 03/28/2018   HGB 12.5 03/28/2018   HCT 36.0 03/28/2018   MCV 88.6 03/28/2018   PLT 244 03/28/2018     STUDIES: Botox Injection  Result Date: 03/19/2020 Location: See attached image Informed consent: Discussed risks (infection, pain, bleeding, bruising, swelling, allergic reaction, paralysis of nearby muscles, eyelid droop, double vision, neck weakness, difficulty breathing, headache, undesirable cosmetic result, and need  for additional treatment) and benefits of the procedure, as well as the alternatives.  Informed consent was obtained. Preparation: The area was cleansed with alcohol. Procedure Details:  Botox was injected into the dermis with a 30-gauge needle. Pressure applied to any bleeding. Ice packs offered for swelling. Lot Number:  [id number] Expiration:  04/2022 Total Units Injected:  20 Plan: Patient was instructed to remain upright for 4 hours. Patient was instructed to avoid massaging the face and avoid vigorous exercise for the rest of the day. Tylenol may be used for headache.  Allow 2 weeks before returning to clinic for additional dosing as needed. Patient will call for any problems.    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  Oncotype DX score  16, which is considered low risk.   PLAN:    1. 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  She had difficulty with wound healing possibly secondary to her methylene blue injection for sentinel node biopsy.  She did not require adjuvant chemotherapy given her low risk Oncotype.  Patient completed adjuvant XRT on April 06, 2018.  Continue tamoxifen for total of 5 years completing treatment in June 2024.  Her most recent mammogram on October 11, 2019 was reported as BI-RADS 2.  Repeat in December 2021.  Return to clinic in 6 months with video assisted telemedicine visit.       2.  Genetic testing: Appreciate genetic counseling input.  Patient was noted to have a variant of unknown significance.  I provided 20 minutes of face-to-face video visit time during this encounter which included chart review, counseling, and coordination of care as documented above.  Patient expressed understanding and was in agreement with this plan. She also understands that She can call clinic at any time with any questions, concerns, or complaints.   Cancer Staging Malignant neoplasm of left breast in female, estrogen receptor positive ([place]) Staging form: Breast, AJCC 8th Edition - Clinical stage from 10/23/2017: Stage IA (cT1a, cN0, cM0, G2, ER: Positive, PR: Positive, HER2: Negative) - Signed by Lloyd Huger, MD on 12/09/2017   Lloyd Huger, MD   03/29/2020 9:12 AM

## 2020-04-02 ENCOUNTER — Encounter: Payer: Self-pay | Admitting: Oncology

## 2020-04-02 ENCOUNTER — Other Ambulatory Visit: Payer: Self-pay

## 2020-04-02 ENCOUNTER — Inpatient Hospital Stay: Payer: PRIVATE HEALTH INSURANCE | Attending: Oncology | Admitting: Oncology

## 2020-04-02 DIAGNOSIS — C50112 Malignant neoplasm of central portion of left female breast: Secondary | ICD-10-CM | POA: Diagnosis not present

## 2020-04-02 DIAGNOSIS — Z170 Estrogen receptor positive status [ER+]: Secondary | ICD-10-CM | POA: Diagnosis not present

## 2020-04-02 NOTE — Progress Notes (Signed)
Patient prescreened for appointment. Patient has no concerns or questions.  

## 2020-04-05 ENCOUNTER — Other Ambulatory Visit: Payer: Self-pay | Admitting: Oncology

## 2020-05-16 ENCOUNTER — Ambulatory Visit: Payer: PRIVATE HEALTH INSURANCE | Admitting: Radiation Oncology

## 2020-05-27 ENCOUNTER — Encounter: Payer: Self-pay | Admitting: Radiation Oncology

## 2020-05-27 ENCOUNTER — Other Ambulatory Visit: Payer: Self-pay

## 2020-05-27 ENCOUNTER — Ambulatory Visit
Admission: RE | Admit: 2020-05-27 | Discharge: 2020-05-27 | Disposition: A | Discharge status: Home / Self Care | Payer: PRIVATE HEALTH INSURANCE | Source: Ambulatory Visit | Attending: Radiation Oncology | Admitting: Radiation Oncology

## 2020-05-27 VITALS — BP 113/81 | HR 67 | Temp 97.6°F | Resp 16 | Wt 156.5 lb

## 2020-05-27 DIAGNOSIS — Z170 Estrogen receptor positive status [ER+]: Secondary | ICD-10-CM | POA: Insufficient documentation

## 2020-05-27 DIAGNOSIS — Z923 Personal history of irradiation: Secondary | ICD-10-CM | POA: Insufficient documentation

## 2020-05-27 DIAGNOSIS — Z79810 Long term (current) use of selective estrogen receptor modulators (SERMs): Secondary | ICD-10-CM | POA: Diagnosis not present

## 2020-05-27 DIAGNOSIS — D0512 Intraductal carcinoma in situ of left breast: Secondary | ICD-10-CM

## 2020-05-27 DIAGNOSIS — C50012 Malignant neoplasm of nipple and areola, left female breast: Secondary | ICD-10-CM | POA: Insufficient documentation

## 2020-05-27 NOTE — Progress Notes (Signed)
Radiation Oncology Follow up Note  Name: Holly Bennett   Date:   05/27/2020 MRN:  [id number] DOB: 1966-07-19    This 54 y.o. female presents to the clinic today for 2-year follow-up status post whole breast radiation to her left breast for stage I invasive mammary carcinoma ER/PR positive.  REFERRING PROVIDER: Princess Bruins, MD  HPI: Patient is a 54 year old female now out 2 years having completed whole breast radiation to her left breast for stage I ER/PR positive invasive mammary carcinoma seen today in routine follow-up she is doing well.  She specifically denies breast tenderness cough or bone pain.  She is currently on tamoxifen tolerating that well without side effect.  She had mammograms back in December which I have reviewed were BI-RADS 2 benign she is scheduled for mammograms next December.    COMPLICATIONS OF TREATMENT: none  FOLLOW UP COMPLIANCE: keeps appointments   PHYSICAL EXAM:  BP 113/81 (BP Location: Left Arm, Patient Position: Sitting)   Pulse 67   Temp 97.6 F (36.4 C) (Tympanic)   Resp 16   Wt 156 lb 8 oz (71 kg)   BMI 26.45 kg/m  Lungs are clear to A&P cardiac examination essentially unremarkable with regular rate and rhythm. No dominant mass or nodularity is noted in either breast in 2 positions examined. Incision is well-healed. No axillary or supraclavicular adenopathy is appreciated. Cosmetic result is excellent.  Well-developed well-nourished patient in NAD. HEENT reveals PERLA, EOMI, discs not visualized.  Oral cavity is clear. No oral mucosal lesions are identified. Neck is clear without evidence of cervical or supraclavicular adenopathy. Lungs are clear to A&P. Cardiac examination is essentially unremarkable with regular rate and rhythm without murmur rub or thrill. Abdomen is benign with no organomegaly or masses noted. Motor sensory and DTR levels are equal and symmetric in the upper and lower extremities. Cranial nerves II through XII are grossly intact.  Proprioception is intact. No peripheral adenopathy or edema is identified. No motor or sensory levels are noted. Crude visual fields are within normal range.  RADIOLOGY RESULTS: Mammograms reviewed compatible with above-stated findings  PLAN: Present time patient is doing well with no evidence of disease.  I am pleased with her overall progress.  She continues on tamoxifen without side effect she already has follow-up mammograms ordered.  I have asked to see her back in 1 year for follow-up.  Patient knows to call with any problems.  I would like to take this opportunity to thank you for allowing me to participate in the care of your patient.Noreene Filbert, MD

## 2020-08-21 ENCOUNTER — Ambulatory Visit (INDEPENDENT_AMBULATORY_CARE_PROVIDER_SITE_OTHER): Payer: PRIVATE HEALTH INSURANCE | Admitting: Psychology

## 2020-08-21 DIAGNOSIS — F411 Generalized anxiety disorder: Secondary | ICD-10-CM | POA: Diagnosis not present

## 2020-08-30 ENCOUNTER — Other Ambulatory Visit: Payer: Self-pay

## 2020-08-30 ENCOUNTER — Encounter: Payer: Self-pay | Admitting: Family Medicine

## 2020-08-30 ENCOUNTER — Telehealth: Payer: Self-pay

## 2020-08-30 ENCOUNTER — Ambulatory Visit: Payer: PRIVATE HEALTH INSURANCE | Admitting: Family Medicine

## 2020-08-30 VITALS — BP 128/86 | HR 67 | Temp 98.1°F | Ht 64.5 in | Wt 154.0 lb

## 2020-08-30 DIAGNOSIS — R635 Abnormal weight gain: Secondary | ICD-10-CM | POA: Diagnosis not present

## 2020-08-30 DIAGNOSIS — B0230 Zoster ocular disease, unspecified: Secondary | ICD-10-CM | POA: Diagnosis not present

## 2020-08-30 MED ORDER — VALACYCLOVIR HCL 1 G PO TABS: 1000.0000 mg | ORAL_TABLET | Freq: Three times a day (TID) | ORAL | 0 refills | Status: AC

## 2020-08-30 NOTE — Telephone Encounter (Signed)
Noted  

## 2020-08-30 NOTE — Patient Instructions (Addendum)
There is not one right eating plan for everyone.  It may take trial and error to find what will work for you.  It is important to get adequate protein and fiber with your meals.  It is okay to not eat breakfast or to skip meals if you are not hungry.  Avoid snacking between meals.  Unless you are on a fluid restriction, drink 80 to 90 ounces of water a day.   Avoid seed oils Dr. Koren Bound Podcast- [employer]  Suggested resources- www.dietdoctor.com/diabetes/diet www.adaptyourlifeacademy.com-there is a quiz to help you determine how many carbohydrates you should eat a day  www.thefastingmethod.com  If you have diabetes or access to a blood sugar machine, I recommend you check your blood sugar daily and keep a log.  Vary the time you check your blood sugar such as fasting, before meal, 2 hours after a meal and at bedtime.  Look for trends with the foods you are eating and be a scientist of your body.  Here are some guidelines to help you with meal planning -  Avoid all processed and packaged foods (bread, pasta, crackers, chips, etc) and beverages containing calories.  Avoid added sugars and excessive natural sugars.  Pay attention to how you feel if you consume artificial sweeteners.  Do they make you more hungry or raise your blood sugar?  With every meal and snack, aim to get 20 g of protein (3 ounces of meat, 4 ounces of fish, 3 eggs, protein powder, 1 cup [place] yogurt, 1 cup cottage cheese, etc.)  Increase fiber in the form of non-starchy vegetables.  These help you feel full with very little carbohydrates and are good for gut health.  Nonstarchy vegetables include summer squash, onions, peppers, tomatoes, eggplant, broccoli, cauliflower, cabbage, lettuce, spinach.  Have small amounts of good fats such as avocado, nuts, olive oil, nut butters, olives.  Add a little cheese to your meals to make them tasty.   Try to plan your meals for the week and do some meal preparation when able.   If possible, make lunches for the week ahead of time.  Plan a couple of dinners and make enough so you can have leftovers.  Build in a treat once a week.     Shingles  Shingles, which is also known as herpes zoster, is an infection that causes a painful skin rash and fluid-filled blisters. It is caused by a virus. Shingles only develops in people who:  Have had chickenpox.  Have been given a medicine to protect against chickenpox (have been vaccinated). Shingles is rare in this group. What are the causes? Shingles is caused by varicella-zoster virus (VZV). This is the same virus that causes chickenpox. After a person is exposed to VZV, the virus stays in the body in an inactive (dormant) state. Shingles develops if the virus is reactivated. This can happen many years after the first (initial) exposure to VZV. It is not known what causes this virus to be reactivated. What increases the risk? People who have had chickenpox or received the chickenpox vaccine are at risk for shingles. Shingles infection is more common in people who:  Are older than age 32.  Have a weakened disease-fighting system (immune system), such as people with: ? HIV. ? AIDS. ? Cancer.  Are taking medicines that weaken the immune system, such as transplant medicines.  Are experiencing a lot of stress. What are the signs or symptoms? Early symptoms of this condition include itching, tingling, and pain in  an area on your skin. Pain may be described as burning, stabbing, or throbbing. A few days or weeks after early symptoms start, a painful red rash appears. The rash is usually on one side of the body and has a band-like or belt-like pattern. The rash eventually turns into fluid-filled blisters that break open, change into scabs, and dry up in about 2-3 weeks. At any time during the infection, you may also develop:  A fever.  Chills.  A headache.  An upset stomach. How is this diagnosed? This condition is  diagnosed with a skin exam. Skin or fluid samples may be taken from the blisters before a diagnosis is made. These samples are examined under a microscope or sent to a lab for testing. How is this treated? The rash may last for several weeks. There is not a specific cure for this condition. Your health care provider will probably prescribe medicines to help you manage pain, recover more quickly, and avoid long-term problems. Medicines may include:  Antiviral drugs.  Anti-inflammatory drugs.  Pain medicines.  Anti-itching medicines (antihistamines). If the area involved is on your face, you may be referred to a specialist, such as an eye doctor (ophthalmologist) or an ear, nose, and throat (ENT) doctor (otolaryngologist) to help you avoid eye problems, chronic pain, or disability. Follow these instructions at home: Medicines  Take over-the-counter and prescription medicines only as told by your health care provider.  Apply an anti-itch cream or numbing cream to the affected area as told by your health care provider. Relieving itching and discomfort   Apply cold, wet cloths (cold compresses) to the area of the rash or blisters as told by your health care provider.  Cool baths can be soothing. Try adding baking soda or dry oatmeal to the water to reduce itching. Do not bathe in hot water. Blister and rash care  Keep your rash covered with a loose bandage (dressing). Wear loose-fitting clothing to help ease the pain of material rubbing against the rash.  Keep your rash and blisters clean by washing the area with mild soap and cool water as told by your health care provider.  Check your rash every day for signs of infection. Check for: ? More redness, swelling, or pain. ? Fluid or blood. ? Warmth. ? Pus or a bad smell.  Do not scratch your rash or pick at your blisters. To help avoid scratching: ? Keep your fingernails clean and cut short. ? Wear gloves or mittens while you sleep, if  scratching is a problem. General instructions  Rest as told by your health care provider.  Keep all follow-up visits as told by your health care provider. This is important.  Wash your hands often with soap and water. If soap and water are not available, use hand sanitizer. Doing this lowers your chance of getting a bacterial skin infection.  Before your blisters change into scabs, your shingles infection can cause chickenpox in people who have never had it or have never been vaccinated against it. To prevent this from happening, avoid contact with other people, especially: ? Babies. ? Pregnant women. ? Children who have eczema. ? Elderly people who have transplants. ? People who have chronic illnesses, such as cancer or AIDS. Contact a health care provider if:  Your pain is not relieved with prescribed medicines.  Your pain does not get better after the rash heals.  You have signs of infection in the rash area, such as: ? More redness, swelling, or pain  around the rash. ? Fluid or blood coming from the rash. ? The rash area feeling warm to the touch. ? Pus or a bad smell coming from the rash. Get help right away if:  The rash is on your face or nose.  You have facial pain, pain around your eye area, or loss of feeling on one side of your face.  You have difficulty seeing.  You have ear pain or have ringing in your ear.  You have a loss of taste.  Your condition gets worse. Summary  Shingles, which is also known as herpes zoster, is an infection that causes a painful skin rash and fluid-filled blisters.  This condition is diagnosed with a skin exam. Skin or fluid samples may be taken from the blisters and examined before the diagnosis is made.  Keep your rash covered with a loose bandage (dressing). Wear loose-fitting clothing to help ease the pain of material rubbing against the rash.  Before your blisters change into scabs, your shingles infection can cause chickenpox  in people who have never had it or have never been vaccinated against it. This information is not intended to replace advice given to you by your health care provider. Make sure you discuss any questions you have with your health care provider. Document Revised: 02/03/2019 Document Reviewed: 06/16/2017 Elsevier Patient Education  2020 [employer].

## 2020-08-30 NOTE — Progress Notes (Signed)
Subjective:    Patient ID: Holly Bennett, female    DOB: 21-Aug-1966, 54 y.o.   MRN: [id number]  HPI Chief Complaint  Patient presents with   New Patient (Initial Visit)   Rash    rash started with left eye redness, then noticed scalp itchy, then noticed rash on left side of face.. Buring, tingling itchy feeling.   This is a 54 yo female who presents today for above cc. She is planning to establish care at this practice but currently sees her gyn for PCP.  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  Last CPE- gyn Mammo- 10/01/2019 Pap- UTD  Colonoscopy- 11/04/2018 Tdap- unknown Flu-most years Covid 19 vaccine- fully vaccinated Eye-annual Dental-regular Exercise-regular  Rash- left eye red Wed, am, yesterday with itchy scalp, rash, no body aches, or fever.  Scalp is sensitive and painful. Took ibuprofen/ tylenol with relief.  Has not been vaccinated against shingles.  Weight gain-patient has reported gaining weight since starting tamoxifen.  She feels that she eats pretty clean and she exercises regularly.   Review of Systems Per HPI    Objective:   Physical Exam Vitals reviewed.  Constitutional:      General: She is not in acute distress.    Appearance: Normal appearance. She is normal weight. She is not toxic-appearing or diaphoretic.  HENT:     Head: Normocephalic and atraumatic.  Eyes:     General: Lids are normal. Gaze aligned appropriately.     Extraocular Movements: Extraocular movements intact.     Conjunctiva/sclera:     Left eye: Hemorrhage present.     Pupils: Pupils are equal, round, and reactive to light.  Cardiovascular:     Rate and Rhythm: Normal rate and regular rhythm.     Heart sounds: Normal heart sounds.  Pulmonary:     Effort: Pulmonary effort is normal.     Breath sounds: Normal breath sounds.  Musculoskeletal:     Cervical back: Normal range  of motion and neck supple.  Skin:    General: Skin is warm and dry.     Findings: Rash (scattered, resolving vesicles above right eye and on right scalp over ear) present.  Neurological:     Mental Status: She is alert and oriented to person, place, and time.          BP 128/86    Pulse 67    Temp 98.1 F (36.7 C) (Temporal)    Ht 5' 4.5" (1.638 m)    Wt 154 lb (69.9 kg)    SpO2 98%    BMI 26.03 kg/m   Assessment & Plan:  1. Herpes zoster with ophthalmic complication, unspecified herpes zoster eye disease -She has already called her eye care provider who will see her today.  Advised her to be seen given proximity of lesions and subconjunctival hemorrhage with suspected herpes zoster. - valACYclovir (VALTREX) 1000 MG tablet; Take 1 tablet (1,000 mg total) by mouth 3 (three) times daily for 7 days.  Dispense: 21 tablet; Refill: 0 - Provided written and verbal information regarding diagnosis and treatment.  2. Weight gain -Provided written and verbal resources  -Discussed alternating follow-up with GYN and family medicine  This visit occurred during the SARS-CoV-2 public health emergency.  Safety protocols were in place, including screening questions prior to the visit, additional usage of staff PPE, and extensive cleaning of exam room  while observing appropriate contact time as indicated for disinfecting solutions.      Clarene Reamer, FNP-BC  [facility]  08/30/2020 6:09 PM

## 2020-08-30 NOTE — Telephone Encounter (Signed)
Pt called in to re establish care and be seen today for rash with blisters on lt side of face and at lt eye; lt eye is red but no vision changes;pt said 2 days ago lt eye became red; now has severe itching on lt side of face and scalp with some tingling sensation also. Pt has never had shingles before and pt does have opthamologist. Pt scheduled to see Glenda Chroman FNP 08/30/20 at 11AM and NP est appt on 10/09/20. FYI to Glenda Chroman FNP.

## 2020-09-03 ENCOUNTER — Encounter: Payer: Self-pay | Admitting: Obstetrics & Gynecology

## 2020-09-03 ENCOUNTER — Ambulatory Visit (INDEPENDENT_AMBULATORY_CARE_PROVIDER_SITE_OTHER): Payer: PRIVATE HEALTH INSURANCE | Admitting: Obstetrics & Gynecology

## 2020-09-03 ENCOUNTER — Other Ambulatory Visit: Payer: Self-pay

## 2020-09-03 VITALS — BP 126/84 | Ht 64.0 in | Wt 154.0 lb

## 2020-09-03 DIAGNOSIS — Z789 Other specified health status: Secondary | ICD-10-CM | POA: Diagnosis not present

## 2020-09-03 DIAGNOSIS — Z853 Personal history of malignant neoplasm of breast: Secondary | ICD-10-CM

## 2020-09-03 DIAGNOSIS — Z01419 Encounter for gynecological examination (general) (routine) without abnormal findings: Secondary | ICD-10-CM

## 2020-09-03 DIAGNOSIS — Z780 Asymptomatic menopausal state: Secondary | ICD-10-CM | POA: Diagnosis not present

## 2020-09-03 NOTE — Progress Notes (Signed)
Holly Bennett 10/27/1965 [id number]   History:    54 y.o. [id number] Married.  Girls 31 and 54 yo.  RP:  Established patient presenting for annual gyn exam   HPI:  Left breast Ca post Lumpectomy/Sentinel LN Bx on 11/17/2017 and Radiation Therapy.  On Tamoxifen x 2 1/2 yrs.  No vaginal bleeding.  No pelvic pain.  No pain with IC.  Urine/BMs normal. BMI 26.43.  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  Health labs with [place].  Past medical history,surgical history, family history and social history were all reviewed and documented in the EPIC chart.  Gynecologic History No LMP recorded. (Menstrual status: Other).  Obstetric History OB History  Gravida Para Term Preterm AB Living  4 2     2 2   SAB TAB Ectopic Multiple Live Births  2            # Outcome Date GA Lbr Len/2nd Weight Sex Delivery Anes PTL Lv  4 SAB           3 SAB           2 Para           1 Para              ROS: A ROS was performed and pertinent positives and negatives are included in the history.  GENERAL: No fevers or chills. HEENT: No change in vision, no earache, sore throat or sinus congestion. NECK: No pain or stiffness. CARDIOVASCULAR: No chest pain or pressure. No palpitations. PULMONARY: No shortness of breath, cough or wheeze. GASTROINTESTINAL: No abdominal pain, nausea, vomiting or diarrhea, melena or bright red blood per rectum. GENITOURINARY: No urinary frequency, urgency, hesitancy or dysuria. MUSCULOSKELETAL: No joint or muscle pain, no back pain, no recent trauma. DERMATOLOGIC: No rash, no itching, no lesions. ENDOCRINE: No polyuria, polydipsia, no heat or cold intolerance. No recent change in weight. HEMATOLOGICAL: No anemia or easy bruising or bleeding. NEUROLOGIC: No headache, seizures, numbness, tingling or weakness. PSYCHIATRIC: No depression, no loss of interest in normal activity or change in sleep pattern.     Exam:   BP 126/84   Ht 5\' 4"  (1.626 m)   Wt 154 lb (69.9 kg)    BMI 26.43 kg/m   Body mass index is 26.43 kg/m.  General appearance : Well developed well nourished female. No acute distress HEENT: Eyes: no retinal hemorrhage or exudates,  Neck supple, trachea midline, no carotid bruits, no thyroidmegaly Lungs: Clear to auscultation, no rhonchi or wheezes, or rib retractions  Heart: Regular rate and rhythm, no murmurs or gallops Breast:Examined in sitting and supine position were symmetrical in appearance, no palpable masses or tenderness,  no skin retraction, no nipple inversion, no nipple discharge, no skin discoloration, no axillary or supraclavicular lymphadenopathy Abdomen: no palpable masses or tenderness, no rebound or guarding Extremities: no edema or skin discoloration or tenderness  Pelvic: Vulva: Normal             Vagina: No gross lesions or discharge  Cervix: No gross lesions or discharge.  Pap reflex done.  Uterus  RV, normal size, shape and consistency, non-tender and mobile  Adnexa  Without masses or tenderness  Anus: Normal   Assessment/Plan:  54 y.o. female for annual exam   1. Encounter for routine gynecological examination with Papanicolaou smear of cervix Normal gynecologic exam.  Pap reflex done.  Breast exam stable status post left breast lumpectomy and left sentinel  node.  Bilateral diagnostic mammogram December 2020 was benign.  Colonoscopy in 2020.  Health labs with family physician.  Body mass index 26.43.  Continue with fitness and healthy nutrition.  2. Menopause present Menopause on tamoxifen for 2-1/2 years.  No postmenopausal bleeding.  Recommend vitamin D supplements, calcium intake of 1500 mg daily and regular weightbearing physical activities.  3. Uses condoms for contraception   4. History of breast cancer On Tamoxifen.  Mammo scheduled for 09/2020.  Other orders - lactobacillus acidophilus (BACID) TABS tablet; Take 2 tablets by mouth 3 (three) times daily.  Princess Bruins MD, 3:26 PM 09/03/2020

## 2020-09-04 ENCOUNTER — Ambulatory Visit (INDEPENDENT_AMBULATORY_CARE_PROVIDER_SITE_OTHER): Payer: PRIVATE HEALTH INSURANCE | Admitting: Psychology

## 2020-09-04 DIAGNOSIS — F411 Generalized anxiety disorder: Secondary | ICD-10-CM

## 2020-09-04 LAB — PAP IG W/ RFLX HPV ASCU

## 2020-09-08 ENCOUNTER — Encounter: Payer: Self-pay | Admitting: Obstetrics & Gynecology

## 2020-09-24 ENCOUNTER — Ambulatory Visit (INDEPENDENT_AMBULATORY_CARE_PROVIDER_SITE_OTHER): Payer: PRIVATE HEALTH INSURANCE | Admitting: Psychology

## 2020-09-24 DIAGNOSIS — F411 Generalized anxiety disorder: Secondary | ICD-10-CM | POA: Diagnosis not present

## 2020-10-09 ENCOUNTER — Other Ambulatory Visit: Payer: Self-pay

## 2020-10-09 ENCOUNTER — Ambulatory Visit (INDEPENDENT_AMBULATORY_CARE_PROVIDER_SITE_OTHER): Payer: PRIVATE HEALTH INSURANCE | Admitting: Family Medicine

## 2020-10-09 VITALS — BP 130/84 | HR 84 | Temp 97.4°F | Ht 64.5 in | Wt 157.1 lb

## 2020-10-09 DIAGNOSIS — E663 Overweight: Secondary | ICD-10-CM

## 2020-10-09 DIAGNOSIS — Z1321 Encounter for screening for nutritional disorder: Secondary | ICD-10-CM | POA: Diagnosis not present

## 2020-10-09 DIAGNOSIS — Z0000 Encounter for general adult medical examination without abnormal findings: Secondary | ICD-10-CM | POA: Diagnosis not present

## 2020-10-09 DIAGNOSIS — Z130 Encounter for screening for diseases of the blood and blood-forming organs and certain disorders involving the immune mechanism: Secondary | ICD-10-CM

## 2020-10-09 NOTE — Progress Notes (Signed)
   Subjective:    Patient ID: Holly Bennett, female    DOB: 01/28/1966, 54 y.o.   MRN: [id number]  HPI Chief Complaint  Patient presents with  . Annual Exam   This is a 54 year old female who presents today for annual exam. She  has a past medical history of Acute right flank pain (01/21/2011), Back pain (01/21/2011), Basal cell carcinoma (02/08/2017), Cancer of central portion of left female breast ([place]) (35/32/9924), Complication of anesthesia, Heart murmur, 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 mention of complication, History of kidney stones, Personal history of other malignant neoplasm of skin (06/10/2017), Personal history of radiation therapy, PONV (postoperative nausea and vomiting), and Viral warts, unspecified.    Last CPE-annual with GYN Mammo-annual, is scheduled for tomorrow Pap-09/03/2020 Colonoscopy-1/10, 2020 Flu- annual Covid 19 vaccine-fully vaccinated Eye-annual Dental-regular Exercise-3-5 times per week, was previously a Physiological scientist     Review of Systems  Constitutional: Negative.   HENT: Negative.   Eyes: Negative.   Respiratory: Negative.   Cardiovascular: Negative.   Gastrointestinal: Negative.   Endocrine: Negative.   Genitourinary: Negative.   Musculoskeletal: Negative.   Skin: Negative.   Neurological: Negative.   Hematological: Negative.   Psychiatric/Behavioral: Negative.        Objective:   Physical Exam    BP 130/84 (BP Location: Right Arm, Patient Position: Sitting, Cuff Size: Normal)   Pulse 84   Temp (!) 97.4 F (36.3 C) (Temporal)   Ht 5' 4.5" (1.638 m)   Wt 157 lb 1.9 oz (71.3 kg)   LMP 03/31/2018 Comment: no period since tamoxifen  SpO2 96%   BMI 26.55 kg/m  Wt Readings from Last 3 Encounters:  10/09/20 157 lb 1.9 oz (71.3 kg)  09/03/20 154 lb (69.9 kg)  08/30/20 154 lb (69.9 kg)   Depression screen [facility] 2/9 10/09/2020 08/30/2020 05/25/2018  Decreased Interest 0 0 0  Down, Depressed, Hopeless 0 0 0  PHQ - 2 Score 0 0 0         Assessment & Plan:  1. Annual physical exam - Discussed and encouraged healthy lifestyle choices- adequate sleep, regular exercise, stress management and healthy food choices.    2. Overweight -She reports some weight gain since starting tamoxifen.  Discussed diet and exercise, limiting processed foods and carbohydrates. - Comprehensive metabolic panel - TSH - Lipid Panel  3. Screening for deficiency anemia - CBC with Differential  4. Encounter for vitamin deficiency screening - Vitamin D, 25-hydroxy  This visit occurred during the SARS-CoV-2 public health emergency.  Safety protocols were in place, including screening questions prior to the visit, additional usage of staff PPE, and extensive cleaning of exam room while observing appropriate contact time as indicated for disinfecting solutions.    Clarene Reamer, FNP-BC  [facility]  10/12/2020 4:40 PM

## 2020-10-09 NOTE — Patient Instructions (Signed)
There is not one right eating plan for everyone.  It may take trial and error to find what will work for you.  It is important to get adequate protein and fiber with your meals.  It is okay to not eat breakfast or to skip meals if you are not hungry.  Avoid snacking between meals.  Unless you are on a fluid restriction, drink 80 to 90 ounces of water a day.  Suggested resources- www.dietdoctor.com/diabetes/diet www.adaptyourlifeacademy.com-there is a quiz to help you determine how many carbohydrates you should eat a day  www.thefastingmethod.com  If you have diabetes or access to a blood sugar machine, I recommend you check your blood sugar daily and keep a log.  Vary the time you check your blood sugar such as fasting, before meal, 2 hours after a meal and at bedtime.  Look for trends with the foods you are eating and be a scientist of your body.  Here are some guidelines to help you with meal planning -  Avoid all processed and packaged foods (bread, pasta, crackers, chips, etc) and beverages containing calories.  Avoid added sugars and excessive natural sugars.  Pay attention to how you feel if you consume artificial sweeteners.  Do they make you more hungry or raise your blood sugar?  With every meal and snack, aim to get 20 g of protein (3 ounces of meat, 4 ounces of fish, 3 eggs, protein powder, 1 cup [place] yogurt, 1 cup cottage cheese, etc.)  Increase fiber in the form of non-starchy vegetables.  These help you feel full with very little carbohydrates and are good for gut health.  Nonstarchy vegetables include summer squash, onions, peppers, tomatoes, eggplant, broccoli, cauliflower, cabbage, lettuce, spinach.  Have small amounts of good fats such as avocado, nuts, olive oil, nut butters, olives.  Add a little cheese to your meals to make them tasty.   Try to plan your meals for the week and do some meal preparation when able.  If possible, make lunches for the week ahead of time.  Plan a  couple of dinners and make enough so you can have leftovers.  Build in a treat once a week.

## 2020-10-10 LAB — LIPID PANEL
Cholesterol: 195 mg/dL (ref 0–200)
HDL: 76.2 mg/dL (ref >39.00)
LDL Cholesterol: 99 mg/dL (ref 0–99)
NonHDL: 118.31
Total CHOL/HDL Ratio: 3
Triglycerides: 96 mg/dL (ref 0.0–149.0)
VLDL: 19.2 mg/dL (ref 0.0–40.0)

## 2020-10-10 LAB — CBC WITH DIFFERENTIAL/PLATELET
Basophils Absolute: 0.1 10*3/uL (ref 0.0–0.1)
Basophils Relative: 1.9 % (ref 0.0–3.0)
Eosinophils Absolute: 0.1 10*3/uL (ref 0.0–0.7)
Eosinophils Relative: 1.9 % (ref 0.0–5.0)
HCT: 37 % (ref 36.0–46.0)
Hemoglobin: 12.5 g/dL (ref 12.0–15.0)
Lymphocytes Relative: 27.4 % (ref 12.0–46.0)
Lymphs Abs: 1.9 10*3/uL (ref 0.7–4.0)
MCHC: 33.9 g/dL (ref 30.0–36.0)
MCV: 88.2 fl (ref 78.0–100.0)
Monocytes Absolute: 0.5 10*3/uL (ref 0.1–1.0)
Monocytes Relative: 6.8 % (ref 3.0–12.0)
Neutro Abs: 4.2 10*3/uL (ref 1.4–7.7)
Neutrophils Relative %: 62 % (ref 43.0–77.0)
Platelets: 276 10*3/uL (ref 150.0–400.0)
RBC: 4.2 Mil/uL (ref 3.87–5.11)
RDW: 13 % (ref 11.5–15.5)
WBC: 6.8 10*3/uL (ref 4.0–10.5)

## 2020-10-10 LAB — TSH: TSH: 1.66 u[IU]/mL (ref 0.35–4.50)

## 2020-10-10 LAB — COMPREHENSIVE METABOLIC PANEL
ALT: 12 U/L (ref 0–35)
AST: 10 U/L (ref 0–37)
Albumin: 4.1 g/dL (ref 3.5–5.2)
Alkaline Phosphatase: 84 U/L (ref 39–117)
BUN: 21 mg/dL (ref 6–23)
CO2: 30 mEq/L (ref 19–32)
Calcium: 9.2 mg/dL (ref 8.4–10.5)
Chloride: 104 mEq/L (ref 96–112)
Creatinine, Ser: 0.78 mg/dL (ref 0.40–1.20)
GFR: 85.97 mL/min (ref >60.00)
Glucose, Bld: 90 mg/dL (ref 70–99)
Potassium: 4.3 mEq/L (ref 3.5–5.1)
Sodium: 140 mEq/L (ref 135–145)
Total Bilirubin: 0.2 mg/dL (ref 0.2–1.2)
Total Protein: 6.8 g/dL (ref 6.0–8.3)

## 2020-10-10 LAB — VITAMIN D 25 HYDROXY (VIT D DEFICIENCY, FRACTURES): VITD: 34.62 ng/mL (ref 30.00–100.00)

## 2020-10-11 ENCOUNTER — Other Ambulatory Visit: Payer: Self-pay

## 2020-10-11 ENCOUNTER — Ambulatory Visit
Admission: RE | Admit: 2020-10-11 | Discharge: 2020-10-11 | Disposition: A | Discharge status: Home / Self Care | Payer: PRIVATE HEALTH INSURANCE | Source: Ambulatory Visit | Attending: Oncology | Admitting: Oncology

## 2020-10-11 DIAGNOSIS — C50112 Malignant neoplasm of central portion of left female breast: Secondary | ICD-10-CM | POA: Insufficient documentation

## 2020-10-11 DIAGNOSIS — Z170 Estrogen receptor positive status [ER+]: Secondary | ICD-10-CM

## 2020-10-12 ENCOUNTER — Encounter: Payer: Self-pay | Admitting: Family Medicine

## 2020-10-13 NOTE — Progress Notes (Signed)
  [facility]  [phone] [phone]) [phone]  ID: Holly Bennett OB: [birth year]  MR#: [id number]  NPV#:[id number]  Patient Care Team: Elby Beck, FNP as PCP - General (Nurse Practitioner) Lloyd Huger, MD as Consulting Physician (Oncology)    Lloyd Huger, MD   10/16/2020 12:54 PM     This encounter was created in error - please disregard.

## 2020-10-15 ENCOUNTER — Ambulatory Visit (INDEPENDENT_AMBULATORY_CARE_PROVIDER_SITE_OTHER): Payer: PRIVATE HEALTH INSURANCE | Admitting: Psychology

## 2020-10-15 ENCOUNTER — Other Ambulatory Visit: Payer: Self-pay

## 2020-10-15 ENCOUNTER — Encounter: Payer: Self-pay | Admitting: Oncology

## 2020-10-15 ENCOUNTER — Inpatient Hospital Stay: Payer: PRIVATE HEALTH INSURANCE | Attending: Oncology | Admitting: Oncology

## 2020-10-15 DIAGNOSIS — F411 Generalized anxiety disorder: Secondary | ICD-10-CM

## 2020-11-04 ENCOUNTER — Ambulatory Visit: Payer: PRIVATE HEALTH INSURANCE | Admitting: Psychology

## 2020-11-05 ENCOUNTER — Encounter: Payer: Self-pay | Admitting: *Deleted

## 2021-03-17 ENCOUNTER — Ambulatory Visit (INDEPENDENT_AMBULATORY_CARE_PROVIDER_SITE_OTHER): Payer: Managed Care, Other (non HMO) | Admitting: Dermatology

## 2021-03-17 ENCOUNTER — Other Ambulatory Visit: Payer: Self-pay

## 2021-03-17 ENCOUNTER — Encounter: Payer: Self-pay | Admitting: Dermatology

## 2021-03-17 DIAGNOSIS — L988 Other specified disorders of the skin and subcutaneous tissue: Secondary | ICD-10-CM

## 2021-03-17 DIAGNOSIS — M67441 Ganglion, right hand: Secondary | ICD-10-CM

## 2021-03-17 DIAGNOSIS — D2372 Other benign neoplasm of skin of left lower limb, including hip: Secondary | ICD-10-CM

## 2021-03-17 DIAGNOSIS — D229 Melanocytic nevi, unspecified: Secondary | ICD-10-CM

## 2021-03-17 DIAGNOSIS — L821 Other seborrheic keratosis: Secondary | ICD-10-CM

## 2021-03-17 DIAGNOSIS — D2371 Other benign neoplasm of skin of right lower limb, including hip: Secondary | ICD-10-CM

## 2021-03-17 DIAGNOSIS — Z85828 Personal history of other malignant neoplasm of skin: Secondary | ICD-10-CM | POA: Diagnosis not present

## 2021-03-17 DIAGNOSIS — D225 Melanocytic nevi of trunk: Secondary | ICD-10-CM

## 2021-03-17 DIAGNOSIS — D1800 Hemangioma unspecified site: Secondary | ICD-10-CM

## 2021-03-17 DIAGNOSIS — L578 Other skin changes due to chronic exposure to nonionizing radiation: Secondary | ICD-10-CM

## 2021-03-17 DIAGNOSIS — M67449 Ganglion, unspecified hand: Secondary | ICD-10-CM

## 2021-03-17 DIAGNOSIS — Z1283 Encounter for screening for malignant neoplasm of skin: Secondary | ICD-10-CM

## 2021-03-17 DIAGNOSIS — L814 Other melanin hyperpigmentation: Secondary | ICD-10-CM

## 2021-03-17 DIAGNOSIS — I781 Nevus, non-neoplastic: Secondary | ICD-10-CM

## 2021-03-17 NOTE — Progress Notes (Signed)
Follow-Up Visit   Subjective  Holly Bennett is a 55 y.o. female who presents for the following: Procedure (Botox injections today, frown complex.). Also TBSE today. She has a history of BCC of the left cheek/inf zygoma. She has a pink bump on her right post lower leg, present x 3 months.   The following portions of the chart were reviewed this encounter and updated as appropriate:       Review of Systems:  No other skin or systemic complaints except as noted in HPI or Assessment and Plan.  Objective  Well appearing patient in no apparent distress; mood and affect are within normal limits.  A full examination was performed including scalp, head, eyes, ears, nose, lips, neck, chest, axillae, abdomen, back, buttocks, bilateral upper extremities, bilateral lower extremities, hands, feet, fingers, toes, fingernails, and toenails. All findings within normal limits unless otherwise noted below.  Objective  Frown complex: Rhytides and volume loss.   Images    Objective  Right Lateral Breast: 5 x 16mm medium dark brown macule, present for years without change  Left Upper Axilla: 2.83mm med dark brown papule  Objective  Right Index Finger: Firm pink translucent nodule, 4.52mm   Assessment & Plan   Skin cancer screening performed today.  Actinic Damage - chronic, secondary to cumulative UV radiation exposure/sun exposure over time - diffuse scaly erythematous macules with underlying dyspigmentation - Recommend daily broad spectrum sunscreen SPF 30+ to sun-exposed areas, reapply every 2 hours as needed.  - Recommend staying in the shade or wearing long sleeves, sun glasses (UVA+UVB protection) and wide brim hats (4-inch brim around the entire circumference of the hat). - Call for new or changing lesions.  History of Basal Cell Carcinoma of the Skin - No evidence of recurrence today of the left cheek/inferior zygoma - Recommend regular full body skin exams - Recommend daily  broad spectrum sunscreen SPF 30+ to sun-exposed areas, reapply every 2 hours as needed.  - Call if any new or changing lesions are noted between office visits   Lentigines - Scattered tan macules - Due to sun exposure - Benign-appering, observe - Recommend daily broad spectrum sunscreen SPF 30+ to sun-exposed areas, reapply every 2 hours as needed. - Call for any changes  Seborrheic Keratoses - Stuck-on, waxy, tan-brown papules and/or plaques  - Benign-appearing - Discussed benign etiology and prognosis. - Observe - Call for any changes  Hemangiomas - Red papules - Discussed benign nature - Observe - Call for any changes  Dermatofibroma - Firm pink/brown papulenodule with dimple sign of the R med thigh x 2, R and L post ankle - Benign appearing - Call for any changes  Melanocytic Nevi - Tan-brown and/or pink-flesh-colored symmetric macules and papules - Benign appearing on exam today - Observation - Call clinic for new or changing moles - Recommend daily use of broad spectrum spf 30+ sunscreen to sun-exposed areas.   Spider Veins - Dilated blue, purple or red veins at the lower extremities - Reassured - These can be treated by sclerotherapy (a procedure to inject a medicine into the veins to make them disappear) if desired, but the treatment is not covered by insurance  Elastosis of skin Frown complex  Botox Injection - Frown complex Location: See attached image  Informed consent: Discussed risks (infection, pain, bleeding, bruising, swelling, allergic reaction, paralysis of nearby muscles, eyelid droop, double vision, neck weakness, difficulty breathing, headache, undesirable cosmetic result, and need for additional treatment) and benefits of the procedure,  as well as the alternatives.  Informed consent was obtained.  Preparation: The area was cleansed with alcohol.  Procedure Details:  Botox was injected into the dermis with a 30-gauge needle. Pressure applied to  any bleeding. Ice packs offered for swelling.  Lot Number:  [id number] Expiration:  02/2003  Total Units Injected:  20  Plan: Patient was instructed to remain upright for 4 hours. Patient was instructed to avoid massaging the face and avoid vigorous exercise for the rest of the day. Tylenol may be used for headache.  Allow 2 weeks before returning to clinic for additional dosing as needed. Patient will call for any problems.   Nevus (2) Left Upper Axilla; Right Lateral Breast  Benign-appearing.  Observation.  Call clinic for new or changing moles.  Recommend daily use of broad spectrum spf 30+ sunscreen to sun-exposed areas.    Digital mucous cyst Right Index Finger  Benign, observe.   Discussed cryotherapy, shave removal and risk of recurrence. Patient defers today since not symptomatic.   Return in about 1 year (around 03/17/2022) for TBSE.   IJamesetta Orleans, CMA, am acting as scribe for Brendolyn Patty, MD .  Documentation: I have reviewed the above documentation for accuracy and completeness, and I agree with the above.  Brendolyn Patty MD

## 2021-03-17 NOTE — Patient Instructions (Addendum)

## 2021-03-25 ENCOUNTER — Other Ambulatory Visit: Payer: Self-pay | Admitting: Oncology

## 2021-04-01 ENCOUNTER — Encounter: Payer: PRIVATE HEALTH INSURANCE | Admitting: Dermatology

## 2021-04-03 ENCOUNTER — Inpatient Hospital Stay: Payer: Managed Care, Other (non HMO) | Attending: Oncology | Admitting: Nurse Practitioner

## 2021-04-03 ENCOUNTER — Other Ambulatory Visit: Payer: Self-pay

## 2021-04-03 ENCOUNTER — Telehealth: Payer: Self-pay

## 2021-04-03 DIAGNOSIS — Z170 Estrogen receptor positive status [ER+]: Secondary | ICD-10-CM | POA: Diagnosis not present

## 2021-04-03 DIAGNOSIS — Z853 Personal history of malignant neoplasm of breast: Secondary | ICD-10-CM | POA: Diagnosis not present

## 2021-04-03 DIAGNOSIS — C50112 Malignant neoplasm of central portion of left female breast: Secondary | ICD-10-CM

## 2021-04-03 NOTE — Progress Notes (Signed)
[place]  Telephone:(336) [phone] Fax:(336) [phone]  ID: Holly Bennett OB: 16-Feb-1966  MR#: [id number]  WUX#:[id number]  Patient Care Team: Elby Beck, FNP (Inactive) as PCP - General (Nurse Practitioner) Lloyd Huger, MD as Consulting Physician (Oncology)  I connected with Holly Bennett on 04/03/21 at  3:30 PM EDT by video enabled telemedicine visit and verified that I am speaking with the correct person using two identifiers.   I discussed the limitations, risks, security and privacy concerns of performing an evaluation and management service by telemedicine and the availability of in-person appointments. I also discussed with the patient that there may be a patient responsible charge related to this service. The patient expressed understanding and agreed to proceed.   Other persons participating in the visit and their role in the encounter: Patient, MD.  Patient's location: Home. Provider's location: Clinic.  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    INTERVAL HISTORY: Patient agreed to video enabled telemedicine visit for routine 40-monthfollow-up.  She initiated tamoxifen in June 2019.  She has now been greater than 1 year without a menstrual..  She complains of hot flashes, fatigue, forgetfulness. She is active and has noticed changes in her physical stamina. She questions if symptoms related to tamoxifen and is eager to complete her treatments. No recent dizziness or weakness.  No recent fevers or illness.  Her appetite is good and she denies unintentional weight loss.  She denies chest pain, shortness of breath, cough, hemoptysis.  No nausea, vomiting, constipation, or diarrhea.  No urinary complaints.  No changes to her breast.  No other specific complaints today.  REVIEW OF SYSTEMS:   Review of Systems  Constitutional:  Negative for chills, fever,  malaise/fatigue and weight loss.  HENT:  Negative for hearing loss, nosebleeds, sore throat and tinnitus.   Eyes:  Negative for blurred vision and double vision.  Respiratory:  Negative for cough, hemoptysis, shortness of breath and wheezing.   Cardiovascular:  Negative for chest pain, palpitations and leg swelling.  Gastrointestinal:  Negative for abdominal pain, blood in stool, constipation, diarrhea, melena, nausea and vomiting.  Genitourinary:  Negative for dysuria and urgency.  Musculoskeletal:  Negative for back pain, falls, joint pain and myalgias.  Skin:  Negative for itching and rash.  Neurological:  Negative for dizziness, tingling, sensory change, loss of consciousness, weakness and headaches.  Endo/Heme/Allergies:  Negative for environmental allergies. Does not bruise/bleed easily.  Psychiatric/Behavioral:  Negative for depression. The patient is not nervous/anxious and does not have insomnia.   As per HPI. Otherwise, a complete review of systems is negative.  PAST MEDICAL HISTORY: Past Medical History:  Diagnosis Date   Acute right flank pain 01/21/2011   Back pain 01/21/2011   Basal cell carcinoma 02/08/2017   left cheek inferior zygoma   [place]   also skin cancer on calf(5 yrs ago) and cheek (august 22536   Complication of anesthesia    Heart murmur    found once many years ago   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 mention of complication    genital   History of kidney stones    passed twice   Personal history of other malignant neoplasm of skin 06/10/2017   Personal history of radiation therapy    LEFFT lumpectomy   PONV (postoperative nausea and vomiting)    Viral warts, unspecified  PAST SURGICAL HISTORY: Past Surgical History:  Procedure Laterality Date   APPENDECTOMY  1983   AXILLARY SENTINEL NODE BIOPSY Left 11/17/2017   Procedure: AXILLARY SENTINEL NODE BIOPSY;  Surgeon: Olean Ree, MD;  Location: [facility] ORS;   Service: General;  Laterality: Left;   BREAST BIOPSY Left 10/15/2017   INVASIVE MAMMARY CARCINOMA   BREAST LUMPECTOMY Left 11/17/2017   INVASIVE MAMMARY CARCINOMA   BREAST LUMPECTOMY     BREAST LUMPECTOMY WITH NEEDLE LOCALIZATION Left 11/17/2017   Procedure: BREAST LUMPECTOMY WITH NEEDLE LOCALIZATION;  Surgeon: Olean Ree, MD;  Location: [facility] ORS;  Service: General;  Laterality: Left;   COLONOSCOPY WITH PROPOFOL N/A 11/04/2018   Procedure: COLONOSCOPY WITH PROPOFOL;  Surgeon: Jonathon Bellows, MD;  Location: [facility] ENDOSCOPY;  Service: Gastroenterology;  Laterality: N/A;   DILATION AND CURETTAGE OF UTERUS     x4   MOHS SURGERY  2018   TONSILLECTOMY AND ADENOIDECTOMY     WISDOM TOOTH EXTRACTION      FAMILY HISTORY: Family History  Problem Relation Age of Onset   Breast cancer Mother 70       currently 29   Hypertension Father    Lung cancer Father 29       smoker; deceased 51   Alcohol abuse Father    Asthma Maternal Grandmother    Arthritis Maternal Grandmother    Heart disease Maternal Grandmother    Asthma Paternal Grandmother    Breast cancer Other        mat grandmother's sister   Depression Daughter     ADVANCED DIRECTIVES (Y/N):  N  HEALTH MAINTENANCE: Social History   Tobacco Use   Smoking status: Never   Smokeless tobacco: Never  Vaping Use   Vaping Use: Never used  Substance Use Topics   Alcohol use: Yes    Comment: Socially on weekends   Drug use: No     Colonoscopy:  PAP:  Bone density:  Lipid panel:  Allergies  Allergen Reactions   Blue Dyes (Parenteral) Dermatitis   Penicillins Rash and Other (See Comments)    Has patient had a PCN reaction causing immediate rash, facial/tongue/throat swelling, SOB or lightheadedness with hypotension: Unknown Has patient had a PCN reaction causing severe rash involving mucus membranes or skin necrosis: No Has patient had a PCN reaction that required hospitalization: No Has patient had a PCN reaction occurring  within the last 10 years: No If all of the above answers are "NO", then may proceed with Cephalosporin use.     Current Outpatient Medications  Medication Sig Dispense Refill   Ascorbic Acid (VITAMIN C) 500 MG tablet Take 500 mg by mouth daily.     Calcium Carbonate-Vitamin D 600-400 MG-UNIT tablet Take 1 tablet by mouth daily.     Collagen Hydrolysate POWD by Does not apply route.     lactobacillus acidophilus (BACID) TABS tablet Take 2 tablets by mouth 3 (three) times daily.     Multiple Vitamin (MULTIVITAMIN) tablet Take 1 tablet by mouth daily.   (Patient not taking: Reported on 10/15/2020)     tamoxifen (NOLVADEX) 20 MG tablet TAKE 1 TABLET BY MOUTH EVERY DAY 90 tablet 0   vitamin B-12 (CYANOCOBALAMIN) 100 MCG tablet Take 100 mcg by mouth daily.     No current facility-administered medications for this visit.    OBJECTIVE: There were no vitals filed for this visit.   There is no height or weight on file to calculate BMI.    ECOG FS:0 - Asymptomatic  General:  Well-developed, well-nourished, no acute distress. HEENT: normocephalic Lungs: No audible wheezing or coughing Neuro: Alert, answering all questions appropriately.  Psych: Normal affect.  LAB RESULTS:  Lab Results  Component Value Date   NA 140 10/09/2020   K 4.3 10/09/2020   CL 104 10/09/2020   CO2 30 10/09/2020   GLUCOSE 90 10/09/2020   BUN 21 10/09/2020   CREATININE 0.78 10/09/2020   CALCIUM 9.2 10/09/2020   PROT 6.8 10/09/2020   ALBUMIN 4.1 10/09/2020   AST 10 10/09/2020   ALT 12 10/09/2020   ALKPHOS 84 10/09/2020   BILITOT 0.2 10/09/2020    Lab Results  Component Value Date   WBC 6.8 10/09/2020   NEUTROABS 4.2 10/09/2020   HGB 12.5 10/09/2020   HCT 37.0 10/09/2020   MCV 88.2 10/09/2020   PLT 276.0 10/09/2020     STUDIES: Botox Injection  Result Date: 03/17/2021 Location: See attached image Informed consent: Discussed risks (infection, pain, bleeding, bruising, swelling, allergic reaction,  paralysis of nearby muscles, eyelid droop, double vision, neck weakness, difficulty breathing, headache, undesirable cosmetic result, and need for additional treatment) and benefits of the procedure, as well as the alternatives.  Informed consent was obtained. Preparation: The area was cleansed with alcohol. Procedure Details:  Botox was injected into the dermis with a 30-gauge needle. Pressure applied to any bleeding. Ice packs offered for swelling. Lot Number:  [id number] Expiration:  02/2003 Total Units Injected:  20 Plan: Patient was instructed to remain upright for 4 hours. Patient was instructed to avoid massaging the face and avoid vigorous exercise for the rest of the day. Tylenol may be used for headache.  Allow 2 weeks before returning to clinic for additional dosing as needed. Patient will call for any problems.     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  Oncotype DX score 16, which is considered low risk.   PLAN:    1. 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  She had difficulty with wound healing possibly secondary to her methylene blue injection for sentinel node biopsy.  She did not require adjuvant chemotherapy given her low risk Oncotype.  Patient completed adjuvant XRT on April 06, 2018.  Discussed rationale for completing 5 years of tamoxifen with completion of treatment in June 2024. Has tolerated well. Hold tamoxifen (see below) for one month. Mammogram 10/11/20 was reported as bi-rads 2: benign. Plan to repeat in December 2022. Schedule at next visit. RTC for re-evaluation in 1 month and can consider restarting tamoxifen at that time.   2.  Genetic testing: Appreciate genetic counseling input.  Patient was noted to have a variant of unknown significance.  3.  Menopause- patient has now been >1 year without a menstrual period. Can check hormones to confirm. Patient declines at this time. I question if her symptoms are related to hormonal changes related to menopause. Hold tamoxifen x 1 month and re-evaluate.  Plan:  RTC in 1 month for video telemedicine visit.  I provided 20 minutes of face-to-face video visit time during this encounter which included chart review, counseling, and coordination of care as documented above.  Patient expressed understanding and was in agreement with this plan. She also understands that She can call clinic at any time with any questions, concerns, or complaints.   Thank you for allowing me  to participate in the care of this very pleasant patient.   Cancer Staging Malignant neoplasm of left breast in female, estrogen receptor positive ([place]) Staging form: Breast, AJCC 8th Edition - Clinical stage from 10/23/2017: Stage IA (cT1a, cN0, cM0, G2, ER: Positive, PR: Positive, HER2: Negative) - Signed by Lloyd Huger, MD on 12/09/2017 Neoadjuvant therapy: No Histologic grading system: 3 grade system Laterality: Left   Verlon Au, NP   04/03/2021   CC: Dr. Grayland Ormond

## 2021-05-06 ENCOUNTER — Other Ambulatory Visit: Payer: Self-pay

## 2021-05-06 ENCOUNTER — Encounter: Payer: Self-pay | Admitting: Nurse Practitioner

## 2021-05-06 ENCOUNTER — Inpatient Hospital Stay: Payer: Managed Care, Other (non HMO) | Attending: Nurse Practitioner | Admitting: Nurse Practitioner

## 2021-05-06 DIAGNOSIS — Z853 Personal history of malignant neoplasm of breast: Secondary | ICD-10-CM | POA: Diagnosis not present

## 2021-05-06 NOTE — Progress Notes (Signed)
[place]  Telephone:(336) [phone] Fax:(336) [phone]  ID: Holly Bennett OB: 1966-06-05  MR#: [id number]  TXM#:[id number]  Patient Care Team: Elby Beck, FNP as PCP - General (Nurse Practitioner) Lloyd Huger, MD as Consulting Physician (Oncology)  I connected with Holly Bennett on 05/06/21 at  3:00 PM EDT by video enabled telemedicine visit and verified that I am speaking with the correct person using two identifiers.   I discussed the limitations, risks, security and privacy concerns of performing an evaluation and management service by telemedicine and the availability of in-person appointments. I also discussed with the patient that there may be a patient responsible charge related to this service. The patient expressed understanding and agreed to proceed.   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    INTERVAL HISTORY: Patient agreed to video enabled telemedicine visit for routine 31-monthfollow-up.  She initiated tamoxifen in June 2019.  She has not had any vaginal bleeding more than 1 year and is considered postmenopausal.  She complains of hot flashes, fatigue, memory changes.  She remains active but has noticed changes in her physical stamina which are bothersome.  She questions if symptoms are related to tamoxifen and is eager to be completed of her treatments.  Otherwise she feels well.  No new lumps or bumps.  No changes to her breast.  No dizziness or weakness.  No new pain.  No shortness of breath, cough, hemoptysis.  No nausea, vomiting, constipation, diarrhea.  No other specific complaints today.  REVIEW OF SYSTEMS:   Review of Systems  Constitutional:  Negative for chills, fever, malaise/fatigue and weight loss.  HENT:   Negative for hearing loss, nosebleeds, sore throat and tinnitus.   Eyes:  Negative for blurred vision and double vision.  Respiratory:  Negative for cough, hemoptysis, shortness of breath and wheezing.   Cardiovascular:  Negative for chest pain, palpitations and leg swelling.  Gastrointestinal:  Negative for abdominal pain, blood in stool, constipation, diarrhea, melena, nausea and vomiting.  Genitourinary:  Negative for dysuria and urgency.  Musculoskeletal:  Negative for back pain, falls, joint pain and myalgias.  Skin:  Negative for itching and rash.  Neurological:  Negative for dizziness, tingling, sensory change, loss of consciousness, weakness and headaches.  Endo/Heme/Allergies:  Negative for environmental allergies. Does not bruise/bleed easily.  Psychiatric/Behavioral:  Negative for depression. The patient is not nervous/anxious and does not have insomnia.   As per HPI. Otherwise, a complete review of systems is negative.  PAST MEDICAL HISTORY: Past Medical History:  Diagnosis Date   Acute right flank pain 01/21/2011   Back pain 01/21/2011   Basal cell carcinoma 02/08/2017   left cheek inferior zygoma   [place] 10/23/2017   also skin cancer on calf(5 yrs ago) and cheek (august [address]   Complication of anesthesia    Heart murmur    found once many years ago   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 mention of complication    genital   History of kidney stones    passed twice   Personal history of other malignant neoplasm of skin 06/10/2017   Personal history of radiation therapy    LEFFT lumpectomy   PONV (postoperative nausea and vomiting)    Viral warts, unspecified  PAST SURGICAL HISTORY: Past Surgical History:  Procedure Laterality Date   APPENDECTOMY  1983   AXILLARY SENTINEL NODE BIOPSY Left 11/17/2017   Procedure: AXILLARY SENTINEL NODE BIOPSY;  Surgeon: Olean Ree, MD;  Location: [facility] ORS;  Service: General;  Laterality: Left;    BREAST BIOPSY Left 10/15/2017   INVASIVE MAMMARY CARCINOMA   BREAST LUMPECTOMY Left 11/17/2017   INVASIVE MAMMARY CARCINOMA   BREAST LUMPECTOMY     BREAST LUMPECTOMY WITH NEEDLE LOCALIZATION Left 11/17/2017   Procedure: BREAST LUMPECTOMY WITH NEEDLE LOCALIZATION;  Surgeon: Olean Ree, MD;  Location: [facility] ORS;  Service: General;  Laterality: Left;   COLONOSCOPY WITH PROPOFOL N/A 11/04/2018   Procedure: COLONOSCOPY WITH PROPOFOL;  Surgeon: Jonathon Bellows, MD;  Location: [facility] ENDOSCOPY;  Service: Gastroenterology;  Laterality: N/A;   DILATION AND CURETTAGE OF UTERUS     x4   MOHS SURGERY  2018   TONSILLECTOMY AND ADENOIDECTOMY     WISDOM TOOTH EXTRACTION      FAMILY HISTORY: Family History  Problem Relation Age of Onset   Breast cancer Mother 70       currently 67   Hypertension Father    Lung cancer Father 60       smoker; deceased 34   Alcohol abuse Father    Asthma Maternal Grandmother    Arthritis Maternal Grandmother    Heart disease Maternal Grandmother    Asthma Paternal Grandmother    Breast cancer Other        mat grandmother's sister   Depression Daughter     ADVANCED DIRECTIVES (Y/N):  N  HEALTH MAINTENANCE: Social History   Tobacco Use   Smoking status: Never   Smokeless tobacco: Never  Vaping Use   Vaping Use: Never used  Substance Use Topics   Alcohol use: Yes    Comment: Socially on weekends   Drug use: No    Colonoscopy:  PAP:  Bone density:  Lipid panel:  Allergies  Allergen Reactions   Blue Dyes (Parenteral) Dermatitis   Penicillins Rash and Other (See Comments)    Has patient had a PCN reaction causing immediate rash, facial/tongue/throat swelling, SOB or lightheadedness with hypotension: Unknown Has patient had a PCN reaction causing severe rash involving mucus membranes or skin necrosis: No Has patient had a PCN reaction that required hospitalization: No Has patient had a PCN reaction occurring within the last 10 years: No If all of the  above answers are "NO", then may proceed with Cephalosporin use.     Current Outpatient Medications  Medication Sig Dispense Refill   Ascorbic Acid (VITAMIN C) 500 MG tablet Take 500 mg by mouth daily.     Calcium Carbonate-Vitamin D 600-400 MG-UNIT tablet Take 1 tablet by mouth daily.     Collagen Hydrolysate POWD by Does not apply route.     glucosamine-chondroitin 500-400 MG tablet Take 1 tablet by mouth in the morning and at bedtime.     lactobacillus acidophilus (BACID) TABS tablet Take 2 tablets by mouth 3 (three) times daily.     vitamin B-12 (CYANOCOBALAMIN) 100 MCG tablet Take 100 mcg by mouth daily.     Multiple Vitamin (MULTIVITAMIN) tablet Take 1 tablet by mouth daily.   (Patient not taking: No sig reported)     tamoxifen (NOLVADEX) 20 MG tablet TAKE 1 TABLET BY MOUTH EVERY DAY (Patient not taking: Reported on 05/06/2021) 90 tablet 0   No current facility-administered medications for this visit.    OBJECTIVE: Exam limited due to telemedicine There  were no vitals filed for this visit.   There is no height or weight on file to calculate BMI.    ECOG FS:0 - Asymptomatic  General: Well-developed, well-nourished, no acute distress. HEENT: normocephalic Lungs: No audible wheezing or coughing Neuro: Alert, answering all questions appropriately.  Psych: Normal affect.  LAB RESULTS:  Lab Results  Component Value Date   NA 140 10/09/2020   K 4.3 10/09/2020   CL 104 10/09/2020   CO2 30 10/09/2020   GLUCOSE 90 10/09/2020   BUN 21 10/09/2020   CREATININE 0.78 10/09/2020   CALCIUM 9.2 10/09/2020   PROT 6.8 10/09/2020   ALBUMIN 4.1 10/09/2020   AST 10 10/09/2020   ALT 12 10/09/2020   ALKPHOS 84 10/09/2020   BILITOT 0.2 10/09/2020    Lab Results  Component Value Date   WBC 6.8 10/09/2020   NEUTROABS 4.2 10/09/2020   HGB 12.5 10/09/2020   HCT 37.0 10/09/2020   MCV 88.2 10/09/2020   PLT 276.0 10/09/2020     STUDIES: No results found.   ASSESSMENT: Clinical  stage IA ER/PR positive, HER-2 negative invasive carcinoma of the central portion of the left breast.  Oncotype DX score 16, which is considered low risk.   PLAN:    1. 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  She had difficulty with wound healing possibly secondary to her methylene blue injection for sentinel node biopsy.  She did not require adjuvant chemotherapy given her low risk Oncotype.  Patient completed adjuvant XRT on April 06, 2018.  Discussed rationale for completing 5 years of tamoxifen with completion of treatment in June 2024.  She is eager to discontinue antiestrogen therapy based on joint pain, decreased stamina, impact on her quality of life.  We discussed holding tamoxifen for 1 month to determine if symptoms are related to medication versus postmenopausal state (see below). Mammogram 10/11/20 was reported as bi-rads 2: benign. Plan to repeat in December 2022.   2.  Genetic testing: Appreciate genetic counseling input.  Patient was noted to have a variant of unknown significance.  3. Menopause- patient has now been >1 year without a menstrual period. Can check hormones to confirm. Patient declines at this time. I question if her symptoms are related to hormonal changes related to menopause. Hold tamoxifen x 1 month and can re-evaluate or if symptoms are not improved, she can restart tamoxifen.   Plan:  Schedule December mammogram Follow up with Dr. Grayland Ormond for results and follow up in January  I provided 20 minutes of face-to-face video visit time during this encounter which included chart review, counseling, and coordination of care as documented above.  Patient expressed understanding and was in agreement with this plan. She also understands that She can call clinic at any time with any questions, concerns, or complaints.   Thank you for  allowing me to participate in the care of this very pleasant patient.   Cancer Staging Malignant neoplasm of left breast in female, estrogen receptor positive ([place]) Staging form: Breast, AJCC 8th Edition - Clinical stage from 10/23/2017: Stage IA (cT1a, cN0, cM0, G2, ER+, PR+, HER2-) - Signed by Lloyd Huger, MD on 12/09/2017 Neoadjuvant therapy: No Histologic grading system: 3 grade system Laterality: Left   Verlon Au, NP   05/06/2021   CC: Dr. Grayland Ormond

## 2021-05-06 NOTE — Progress Notes (Signed)
Patient reports that her joint pain has improved but not gone since stopping the Tamoxifen.  She has started taking Glucosamine.

## 2021-05-28 ENCOUNTER — Ambulatory Visit: Payer: PRIVATE HEALTH INSURANCE | Admitting: Radiation Oncology

## 2021-06-11 ENCOUNTER — Ambulatory Visit
Admission: RE | Admit: 2021-06-11 | Discharge: 2021-06-11 | Disposition: A | Discharge status: Home / Self Care | Payer: Managed Care, Other (non HMO) | Source: Ambulatory Visit | Attending: Radiation Oncology | Admitting: Radiation Oncology

## 2021-06-11 ENCOUNTER — Encounter: Payer: Self-pay | Admitting: Radiation Oncology

## 2021-06-11 VITALS — BP 144/89 | HR 74 | Temp 98.1°F | Wt 146.0 lb

## 2021-06-11 DIAGNOSIS — Z923 Personal history of irradiation: Secondary | ICD-10-CM | POA: Diagnosis not present

## 2021-06-11 DIAGNOSIS — D0512 Intraductal carcinoma in situ of left breast: Secondary | ICD-10-CM

## 2021-06-11 DIAGNOSIS — Z853 Personal history of malignant neoplasm of breast: Secondary | ICD-10-CM | POA: Insufficient documentation

## 2021-06-11 DIAGNOSIS — M7989 Other specified soft tissue disorders: Secondary | ICD-10-CM | POA: Insufficient documentation

## 2021-06-11 DIAGNOSIS — R140 Abdominal distension (gaseous): Secondary | ICD-10-CM | POA: Diagnosis not present

## 2021-06-11 NOTE — Progress Notes (Signed)
Radiation Oncology Follow up Note  Name: Holly Bennett   Date:   06/11/2021 MRN:  [id number] DOB: 04-03-1966    This 55 y.o. female presents to the clinic today for 3-year follow-up status post whole breast radiation to her left breast for stage I invasive mammary carcinoma ER/PR positive.  REFERRING PROVIDER: Princess Bruins, MD  HPI: Patient is a 56 year old female now out 3 years having completed whole breast radiation to her left breast for stage I invasive mammary carcinoma.  Seen today in routine follow-up she is doing well.  She had mammograms back in December which I have reviewed were BI-RADS 2 benign after 3 years she is discontinued her antiestrogen therapy based on joint pain.  She also is noticing some swelling in her lower extremities and just feeling somewhat bloated.  She specifically denies breast tenderness cough or bone pain.  She did have evaluation of her ankle by orthopod showing no evidence of pathology..  COMPLICATIONS OF TREATMENT: none  FOLLOW UP COMPLIANCE: keeps appointments   PHYSICAL EXAM:  BP (!) 144/89   Pulse 74   Temp 98.1 F (36.7 C) (Tympanic)   Wt 146 lb (66.2 kg)   LMP 03/31/2018 Comment: no period since tamoxifen  BMI 24.67 kg/m  Lungs are clear to A&P cardiac examination essentially unremarkable with regular rate and rhythm. No dominant mass or nodularity is noted in either breast in 2 positions examined. Incision is well-healed. No axillary or supraclavicular adenopathy is appreciated. Cosmetic result is excellent.  Well-developed well-nourished patient in NAD. HEENT reveals PERLA, EOMI, discs not visualized.  Oral cavity is clear. No oral mucosal lesions are identified. Neck is clear without evidence of cervical or supraclavicular adenopathy. Lungs are clear to A&P. Cardiac examination is essentially unremarkable with regular rate and rhythm without murmur rub or thrill. Abdomen is benign with no organomegaly or masses noted. Motor sensory and DTR  levels are equal and symmetric in the upper and lower extremities. Cranial nerves II through XII are grossly intact. Proprioception is intact. No peripheral adenopathy or edema is identified. No motor or sensory levels are noted. Crude visual fields are within normal range.  The level of edema in her lower extremities is minimal.  RADIOLOGY RESULTS: Mammograms reviewed compatible with above-stated findings  PLAN: Present time patient is doing well from a breast standpoint with no evidence of disease.  And pleased with her overall progress she is scheduled for follow-up mammograms in December.  She has discontinued antiestrogen therapy.  I have asked her to visit a GP for yearly checkup with evaluation of possible her swelling in her lower extremities.  Patient will take care of that.  Patient knows to call with any concerns.  I would like to take this opportunity to thank you for allowing me to participate in the care of your patient.Noreene Filbert, MD

## 2021-06-17 ENCOUNTER — Other Ambulatory Visit: Payer: Self-pay | Admitting: Oncology

## 2021-07-03 ENCOUNTER — Other Ambulatory Visit: Payer: Self-pay

## 2021-07-03 DIAGNOSIS — N950 Postmenopausal bleeding: Secondary | ICD-10-CM

## 2021-07-10 ENCOUNTER — Ambulatory Visit: Payer: Managed Care, Other (non HMO) | Admitting: Obstetrics & Gynecology

## 2021-07-10 ENCOUNTER — Encounter: Payer: Self-pay | Admitting: Obstetrics & Gynecology

## 2021-07-10 ENCOUNTER — Other Ambulatory Visit: Payer: Self-pay

## 2021-07-10 ENCOUNTER — Ambulatory Visit (INDEPENDENT_AMBULATORY_CARE_PROVIDER_SITE_OTHER): Payer: Managed Care, Other (non HMO)

## 2021-07-10 VITALS — BP 110/72

## 2021-07-10 DIAGNOSIS — C50112 Malignant neoplasm of central portion of left female breast: Secondary | ICD-10-CM | POA: Diagnosis not present

## 2021-07-10 DIAGNOSIS — N950 Postmenopausal bleeding: Secondary | ICD-10-CM | POA: Diagnosis not present

## 2021-07-10 DIAGNOSIS — Z780 Asymptomatic menopausal state: Secondary | ICD-10-CM

## 2021-07-10 DIAGNOSIS — Z170 Estrogen receptor positive status [ER+]: Secondary | ICD-10-CM | POA: Diagnosis not present

## 2021-07-10 NOTE — Progress Notes (Signed)
    Holly Bennett [birth year] [id number]        55 y.o.  [id number]   RP: PMB for Pelvic US  HPI: Light PMB while on Tamoxifen.  No PMB x stopped Tamoxifen 3 months ago.  Was on Tamoxifen x 3 yrs.   Left breast Ca post Lumpectomy/Sentinel LN Bx on 11/17/2017 and Radiation Therapy.     OB History  Gravida Para Term Preterm AB Living  '4 2     2 2  '$ SAB IAB Ectopic Multiple Live Births  2            # Outcome Date GA Lbr Len/2nd Weight Sex Delivery Anes PTL Lv  4 SAB           3 SAB           2 Para           1 Para             Past medical history,surgical history, problem list, medications, allergies, family history and social history were all reviewed and documented in the EPIC chart.   Directed ROS with pertinent positives and negatives documented in the history of present illness/assessment and plan.  Exam:  Vitals:   07/10/21 0833  BP: 110/72   General appearance:  Normal  Pelvic US today: T/V images.  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  Uterus is measured at 8.12 x 5.06 x 4.38 cm.  No myometrial mass.  Thin, symmetrical endometrium bilaterally.  No mass or thickening.  The endometrial line is measured at 2.6 and 2.72 mm.  Trace amount of fluid seen in the cavity in both horns.  Both ovaries are atrophic in appearance with sparse follicles.  No adnexal mass.  Small amount of free fluid in the cul-de-sac.   Assessment/Plan:  55 y.o. [id number]   1. Post-menopausal bleeding Mild PMB while on Tamoxifen.  Pelvic US findings thoroughly reviewed with patient.  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    2. Menopause present Well on no HRT.  3. Malignant neoplasm of central portion of left breast in female, estrogen receptor positive (HCC) Tamoxifen x 3 years, stopped 3 months ago.  Other orders - meloxicam (MOBIC) 15 MG tablet; Take 15 mg by mouth daily.   Princess Bruins MD,  8:45 AM 07/10/2021

## 2021-07-21 ENCOUNTER — Encounter: Payer: Self-pay | Admitting: Licensed Clinical Social Worker

## 2021-07-21 NOTE — Telephone Encounter (Signed)
VUS in [place] called c.[id number]>G (p.[id number])  Has been reclassfied to "Likely Benign." The report date is 07/14/2021.

## 2021-09-04 ENCOUNTER — Ambulatory Visit: Payer: Self-pay | Admitting: Obstetrics & Gynecology

## 2021-09-26 NOTE — Telephone Encounter (Signed)
See previous note from 04/03/21, signing note

## 2021-09-30 ENCOUNTER — Other Ambulatory Visit (HOSPITAL_COMMUNITY)
Admission: RE | Admit: 2021-09-30 | Discharge: 2021-09-30 | Disposition: A | Discharge status: Home / Self Care | Payer: Managed Care, Other (non HMO) | Source: Ambulatory Visit | Attending: Obstetrics & Gynecology | Admitting: Obstetrics & Gynecology

## 2021-09-30 ENCOUNTER — Ambulatory Visit (INDEPENDENT_AMBULATORY_CARE_PROVIDER_SITE_OTHER): Payer: Managed Care, Other (non HMO) | Admitting: Obstetrics & Gynecology

## 2021-09-30 ENCOUNTER — Other Ambulatory Visit: Payer: Self-pay

## 2021-09-30 ENCOUNTER — Encounter: Payer: Self-pay | Admitting: Obstetrics & Gynecology

## 2021-09-30 VITALS — BP 128/84 | HR 76 | Resp 16 | Ht 64.25 in | Wt 162.0 lb

## 2021-09-30 DIAGNOSIS — Z01419 Encounter for gynecological examination (general) (routine) without abnormal findings: Secondary | ICD-10-CM | POA: Diagnosis not present

## 2021-09-30 DIAGNOSIS — Z170 Estrogen receptor positive status [ER+]: Secondary | ICD-10-CM

## 2021-09-30 DIAGNOSIS — Z780 Asymptomatic menopausal state: Secondary | ICD-10-CM | POA: Diagnosis not present

## 2021-09-30 DIAGNOSIS — C50112 Malignant neoplasm of central portion of left female breast: Secondary | ICD-10-CM

## 2021-09-30 NOTE — Progress Notes (Signed)
Holly Bennett 02-16-1966 [id number]   History:    55 y.o. [id number] Married.  Girls 64 and 55 yo.   RP:  Established patient presenting for annual gyn exam    HPI:  Left breast Ca post Lumpectomy/Sentinel LN Bx on 11/17/2017 and Radiation Therapy.  Stopped Tamoxifen x 03/2021.  No PMB x 06/2021 when a Pelvic US showed a thin normal endometrial line at 2.7 mm.  No pelvic pain.  No pain with IC.  Pap 08/2020 Neg. Breasts normal.  Mammo Neg 10/11/2020, scheduled this December.  Urine/BMs normal. BMI 27.59.  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.   Past medical history,surgical history, family history and social history were all reviewed and documented in the EPIC chart.  Gynecologic History Patient's last menstrual period was 03/31/2018.  Obstetric History OB History  Gravida Para Term Preterm AB Living  4 2     2 2   SAB IAB Ectopic Multiple Live Births  2            # Outcome Date GA Lbr Len/2nd Weight Sex Delivery Anes PTL Lv  4 SAB           3 SAB           2 Para           1 Para              ROS: A ROS was performed and pertinent positives and negatives are included in the history.  GENERAL: No fevers or chills. HEENT: No change in vision, no earache, sore throat or sinus congestion. NECK: No pain or stiffness. CARDIOVASCULAR: No chest pain or pressure. No palpitations. PULMONARY: No shortness of breath, cough or wheeze. GASTROINTESTINAL: No abdominal pain, nausea, vomiting or diarrhea, melena or bright red blood per rectum. GENITOURINARY: No urinary frequency, urgency, hesitancy or dysuria. MUSCULOSKELETAL: No joint or muscle pain, no back pain, no recent trauma. DERMATOLOGIC: No rash, no itching, no lesions. ENDOCRINE: No polyuria, polydipsia, no heat or cold intolerance. No recent change in weight. HEMATOLOGICAL: No anemia or easy bruising or bleeding. NEUROLOGIC: No headache, seizures, numbness, tingling or weakness. PSYCHIATRIC: No  depression, no loss of interest in normal activity or change in sleep pattern.     Exam:   BP 128/84   Pulse 76   Resp 16   Ht 5' 4.25" (1.632 m)   Wt 162 lb (73.5 kg)   LMP 03/31/2018 Comment: no period since tamoxifen  BMI 27.59 kg/m   Body mass index is 27.59 kg/m.  General appearance : Well developed well nourished female. No acute distress HEENT: Eyes: no retinal hemorrhage or exudates,  Neck supple, trachea midline, no carotid bruits, no thyroidmegaly Lungs: Clear to auscultation, no rhonchi or wheezes, or rib retractions  Heart: Regular rate and rhythm, no murmurs or gallops Breast:Examined in sitting and supine position were symmetrical in appearance, no palpable masses or tenderness,  no skin retraction, no nipple inversion, no nipple discharge, no skin discoloration, no axillary or supraclavicular lymphadenopathy Abdomen: no palpable masses or tenderness, no rebound or guarding Extremities: no edema or skin discoloration or tenderness  Pelvic: Vulva: Normal             Vagina: No gross lesions or discharge  Cervix: No gross lesions or discharge.  Pap reflex done.  Uterus  AV, normal size, shape and consistency, non-tender and mobile  Adnexa  Without masses or tenderness  Anus: Normal   Assessment/Plan:  55 y.o. female for annual exam   1. Encounter for routine gynecological examination with Papanicolaou smear of cervix Left breast Ca post Lumpectomy/Sentinel LN Bx on 11/17/2017 and Radiation Therapy.  Stopped Tamoxifen x 03/2021.  No PMB x 06/2021 when a Pelvic US showed a thin normal endometrial line at 2.7 mm.  No pelvic pain.  No pain with IC.  Pap 08/2020 Neg. Breasts normal.  Mammo Neg 10/11/2020, scheduled this December.  Urine/BMs normal. BMI 27.59.  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 - Cytology - PAP( [place])  2. Menopause present Well on no HRT.  No PMB since Pelvic US showing a thin endometrial line in  06/2021.  3. Malignant neoplasm of central portion of left breast in female, estrogen receptor positive ([place]) Left breast Ca post Lumpectomy/Sentinel LN Bx on 11/17/2017 and Radiation Therapy.  Stopped Tamoxifen x 03/2021.  No PMB x 06/2021 when a Pelvic US showed a thin normal endometrial line at 2.7 mm.   Other orders - Turmeric (QC TUMERIC COMPLEX PO); Take by mouth.   Princess Bruins MD, 2:51 PM 09/30/2021

## 2021-10-06 LAB — CYTOLOGY - PAP
Comment: NEGATIVE
High risk HPV: NEGATIVE

## 2021-10-13 ENCOUNTER — Other Ambulatory Visit: Payer: Self-pay

## 2021-10-13 ENCOUNTER — Ambulatory Visit
Admission: RE | Admit: 2021-10-13 | Discharge: 2021-10-13 | Disposition: A | Discharge status: Home / Self Care | Payer: Managed Care, Other (non HMO) | Source: Ambulatory Visit | Attending: Nurse Practitioner | Admitting: Nurse Practitioner

## 2021-10-13 DIAGNOSIS — Z1231 Encounter for screening mammogram for malignant neoplasm of breast: Secondary | ICD-10-CM | POA: Insufficient documentation

## 2021-10-13 DIAGNOSIS — Z853 Personal history of malignant neoplasm of breast: Secondary | ICD-10-CM

## 2021-10-29 ENCOUNTER — Telehealth: Payer: Self-pay

## 2021-10-29 NOTE — Telephone Encounter (Signed)
I left a voicemail on patient phone to let her know that her mammogram results came back negative and that we will continue surveillance with her future appointments. Holly Bennett

## 2021-11-02 NOTE — Progress Notes (Signed)
[place]  [phone]) [phone] [phone]) [phone]  ID: Holly Bennett OB: 29-Jun-1966  MR#: [id number]  VXY#:[id number]  Patient Care Team: Elby Beck, FNP as PCP - General (Nurse Practitioner) Lloyd Huger, MD as Consulting Physician (Oncology)   CHIEF COMPLAINT: Clinical stage IA ER/PR positive, HER-2 negative invasive carcinoma of the central portion of the left breast.  INTERVAL HISTORY: Patient returns to clinic today for routine 54-monthevaluation.  She continues to feel well and remains asymptomatic.  She discontinued tamoxifen approximately 6 months ago. She has no neurologic complaints.  She denies any recent fevers or illnesses.  She has a good appetite and denies weight loss.  She denies any chest pain, shortness of breath, cough, or hemoptysis.  She denies any nausea, vomiting, constipation, or diarrhea.  She has no urinary complaints.  Patient offers no specific complaints today.  REVIEW OF SYSTEMS:   Review of Systems  Constitutional: Negative.  Negative for fever, malaise/fatigue and weight loss.  Respiratory: Negative.  Negative for cough and shortness of breath.   Cardiovascular: Negative.  Negative for chest pain and leg swelling.  Gastrointestinal: Negative.  Negative for abdominal pain.  Genitourinary: Negative.  Negative for dysuria.  Musculoskeletal: Negative.  Negative for back pain and joint pain.  Skin: Negative.  Negative for rash.  Neurological: Negative.  Negative for dizziness, sensory change, focal weakness and weakness.  Psychiatric/Behavioral: Negative.  The patient is not nervous/anxious.    As per HPI. Otherwise, a complete review of systems is negative.  PAST MEDICAL HISTORY: Past Medical History:  Diagnosis Date   Acute right flank pain 01/21/2011   Back pain 01/21/2011   Basal cell carcinoma 02/08/2017   left cheek inferior zygoma   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   History of kidney stones    passed twice   Personal history of other malignant neoplasm of skin 06/10/2017   Personal history of radiation therapy    LEFFT lumpectomy   PONV (postoperative nausea and vomiting)    Viral warts, unspecified     PAST SURGICAL HISTORY: Past Surgical History:  Procedure Laterality Date   APPENDECTOMY  1983   AXILLARY SENTINEL NODE BIOPSY Left 11/17/2017   Procedure: AXILLARY SENTINEL NODE BIOPSY;  Surgeon: POlean Ree MD;  Location: [facility] ORS;  Service: General;  Laterality: Left;   BREAST BIOPSY Left 10/15/2017   INVASIVE MAMMARY CARCINOMA   BREAST LUMPECTOMY Left 11/17/2017   INVASIVE MAMMARY CARCINOMA with radiation   BREAST LUMPECTOMY     BREAST LUMPECTOMY WITH NEEDLE LOCALIZATION Left 11/17/2017   Procedure: BREAST LUMPECTOMY WITH NEEDLE LOCALIZATION;  Surgeon: POlean Ree MD;  Location: [facility] ORS;  Service: General;  Laterality: Left;   COLONOSCOPY WITH PROPOFOL N/A 11/04/2018   Procedure: COLONOSCOPY WITH PROPOFOL;  Surgeon: AJonathon Bellows MD;  [facility];  Service: Gastroenterology;  Laterality: N/A;   DILATION AND CURETTAGE OF UTERUS     x4   MOHS SURGERY  2018   TONSILLECTOMY AND ADENOIDECTOMY     WISDOM TOOTH EXTRACTION      FAMILY HISTORY: Family History  Problem Relation Age of Onset   Breast cancer Mother 639      currently 735  Hypertension  Father    Lung cancer Father 30       smoker; deceased 83   Alcohol abuse Father    Asthma Maternal Grandmother    Arthritis Maternal Grandmother    Heart disease Maternal Grandmother    Asthma Paternal Grandmother    Breast cancer Other        mat grandmother's sister   Depression Daughter     ADVANCED DIRECTIVES (Y/N):  N  HEALTH MAINTENANCE: Social History   Tobacco Use    Smoking status: Never   Smokeless tobacco: Never  Vaping Use   Vaping Use: Never used  Substance Use Topics   Alcohol use: Yes    Comment: Socially on weekends   Drug use: No     Colonoscopy:  PAP:  Bone density:  Lipid panel:  Allergies  Allergen Reactions   Blue Dyes (Parenteral) Dermatitis   Penicillins Rash and Other (See Comments)    Has patient had a PCN reaction causing immediate rash, facial/tongue/throat swelling, SOB or lightheadedness with hypotension: Unknown Has patient had a PCN reaction causing severe rash involving mucus membranes or skin necrosis: No Has patient had a PCN reaction that required hospitalization: No Has patient had a PCN reaction occurring within the last 10 years: No If all of the above answers are "NO", then may proceed with Cephalosporin use.     Current Outpatient Medications  Medication Sig Dispense Refill   Ascorbic Acid (VITAMIN C) 500 MG tablet Take 500 mg by mouth daily.     Calcium Carbonate-Vitamin D 600-400 MG-UNIT tablet Take 1 tablet by mouth daily.     Collagen Hydrolysate POWD by Does not apply route.     glucosamine-chondroitin 500-400 MG tablet Take 1 tablet by mouth in the morning and at bedtime.     lactobacillus acidophilus (BACID) TABS tablet Take 2 tablets by mouth 3 (three) times daily.     Multiple Vitamin (MULTIVITAMIN) tablet Take 1 tablet by mouth daily.     Turmeric (QC TUMERIC COMPLEX PO) Take by mouth.     vitamin B-12 (CYANOCOBALAMIN) 100 MCG tablet Take 100 mcg by mouth daily.     No current facility-administered medications for this visit.    OBJECTIVE: Vitals:   11/04/21 1504  BP: (!) 141/90  Pulse: 76  Resp: 18  SpO2: 98%     Body mass index is 27.93 kg/m.    ECOG FS:0 - Asymptomatic  General: Well-developed, well-nourished, no acute distress. Eyes: Pink conjunctiva, anicteric sclera. HEENT: Normocephalic, moist mucous membranes. Breast: Exam deferred today. Lungs: No audible wheezing or  coughing. Heart: Regular rate and rhythm. Abdomen: Soft, nontender, no obvious distention. Musculoskeletal: No edema, cyanosis, or clubbing. Neuro: Alert, answering all questions appropriately. Cranial nerves grossly intact. Skin: No rashes or petechiae noted. Psych: Normal affect.    LAB RESULTS:  Lab Results  Component Value Date   NA 140 10/09/2020   K 4.3 10/09/2020   CL 104 10/09/2020   CO2 30 10/09/2020   GLUCOSE 90 10/09/2020   BUN 21 10/09/2020   CREATININE 0.78 10/09/2020   CALCIUM 9.2 10/09/2020   PROT 6.8 10/09/2020   ALBUMIN 4.1 10/09/2020   AST 10 10/09/2020   ALT 12 10/09/2020   ALKPHOS 84 10/09/2020   BILITOT 0.2 10/09/2020    Lab Results  Component Value Date   WBC 6.8 10/09/2020   NEUTROABS 4.2 10/09/2020   HGB 12.5 10/09/2020   HCT 37.0 10/09/2020   MCV 88.2 10/09/2020   PLT 276.0 10/09/2020  STUDIES: MM 3D SCREEN BREAST BILATERAL  Result Date: 10/14/2021 CLINICAL DATA:  Screening. EXAM: DIGITAL SCREENING BILATERAL MAMMOGRAM WITH TOMOSYNTHESIS AND CAD TECHNIQUE: Bilateral screening digital craniocaudal and mediolateral oblique mammograms were obtained. Bilateral screening digital breast tomosynthesis was performed. The images were evaluated with computer-aided detection. COMPARISON:  Previous exam(s). ACR Breast Density Category b: There are scattered areas of fibroglandular density. FINDINGS: There are no findings suspicious for malignancy. IMPRESSION: No mammographic evidence of malignancy. A result letter of this screening mammogram will be mailed directly to the patient. RECOMMENDATION: Screening mammogram in one year. (Code:SM-B-01Y) BI-RADS CATEGORY  1: Negative. Electronically Signed   By: Abelardo Diesel M.D.   On: 10/14/2021 07:11    ASSESSMENT: Clinical stage IA ER/PR positive, HER-2 negative invasive carcinoma of the central portion of the left breast.  Oncotype DX score 16, which is considered low risk.   PLAN:    1. Clinical stage IA  ER/PR positive, HER-2 negative invasive carcinoma of the central portion of the left breast: Patient had lumpectomy on November 17, 2017 confirming stage of disease.  She had difficulty with wound healing possibly secondary to her methylene blue injection for sentinel node biopsy.  She did not require adjuvant chemotherapy given her low risk Oncotype.  Patient completed adjuvant XRT on April 06, 2018.  Patient discontinued tamoxifen after approximately 3-1/2 years of treatment secondary to side effects and has elected not to reinitiate treatment.  Her most recent mammogram on 05/13/2021 was reported as BI-RADS 1.  Repeat in December 2023.  Return to clinic 1 to 2 days after her next mammogram for routine evaluation at which point patient can likely be discharged from clinic.        2.  Genetic testing: Appreciate genetic counseling input.  Patient was noted to have a variant of unknown significance.  I spent a total of 20 minutes reviewing chart data, face-to-face evaluation with the patient, counseling and coordination of care as detailed above.   Patient expressed understanding and was in agreement with this plan. She also understands that She can call clinic at any time with any questions, concerns, or complaints.    Cancer Staging  Malignant neoplasm of left breast in female, estrogen receptor positive ([place]) Staging form: Breast, AJCC 8th Edition - Clinical stage from 10/23/2017: Stage IA (cT1a, cN0, cM0, G2, ER+, PR+, HER2-) - Signed by Lloyd Huger, MD on 12/09/2017 Neoadjuvant therapy: No Histologic grading system: 3 grade system Laterality: Left   Lloyd Huger, MD   11/05/2021 5:10 PM

## 2021-11-04 ENCOUNTER — Telehealth: Payer: Self-pay

## 2021-11-04 ENCOUNTER — Inpatient Hospital Stay: Payer: Managed Care, Other (non HMO) | Attending: Oncology | Admitting: Oncology

## 2021-11-04 ENCOUNTER — Other Ambulatory Visit: Payer: Self-pay

## 2021-11-04 VITALS — BP 141/90 | HR 76 | Resp 18 | Wt 164.0 lb

## 2021-11-04 DIAGNOSIS — Z170 Estrogen receptor positive status [ER+]: Secondary | ICD-10-CM | POA: Diagnosis not present

## 2021-11-04 DIAGNOSIS — Z803 Family history of malignant neoplasm of breast: Secondary | ICD-10-CM | POA: Insufficient documentation

## 2021-11-04 DIAGNOSIS — Z811 Family history of alcohol abuse and dependence: Secondary | ICD-10-CM | POA: Insufficient documentation

## 2021-11-04 DIAGNOSIS — Z79899 Other long term (current) drug therapy: Secondary | ICD-10-CM | POA: Diagnosis not present

## 2021-11-04 DIAGNOSIS — Z8261 Family history of arthritis: Secondary | ICD-10-CM | POA: Insufficient documentation

## 2021-11-04 DIAGNOSIS — Z923 Personal history of irradiation: Secondary | ICD-10-CM | POA: Insufficient documentation

## 2021-11-04 DIAGNOSIS — Z836 Family history of other diseases of the respiratory system: Secondary | ICD-10-CM | POA: Diagnosis not present

## 2021-11-04 DIAGNOSIS — Z818 Family history of other mental and behavioral disorders: Secondary | ICD-10-CM | POA: Diagnosis not present

## 2021-11-04 DIAGNOSIS — Z8249 Family history of ischemic heart disease and other diseases of the circulatory system: Secondary | ICD-10-CM | POA: Insufficient documentation

## 2021-11-04 DIAGNOSIS — C50112 Malignant neoplasm of central portion of left female breast: Secondary | ICD-10-CM | POA: Insufficient documentation

## 2021-11-04 DIAGNOSIS — Z801 Family history of malignant neoplasm of trachea, bronchus and lung: Secondary | ICD-10-CM | POA: Insufficient documentation

## 2021-11-04 DIAGNOSIS — Z9049 Acquired absence of other specified parts of digestive tract: Secondary | ICD-10-CM | POA: Insufficient documentation

## 2021-11-04 DIAGNOSIS — Z87442 Personal history of urinary calculi: Secondary | ICD-10-CM | POA: Insufficient documentation

## 2021-11-04 DIAGNOSIS — Z880 Allergy status to penicillin: Secondary | ICD-10-CM | POA: Insufficient documentation

## 2021-11-04 NOTE — Telephone Encounter (Signed)
Left patient a VM to see if she could come in at 3:00, change to a virtual visit, OR reschedule to another day due to dr Woodfin Ganja needing to leave a little early today.

## 2021-11-28 ENCOUNTER — Ambulatory Visit: Payer: Managed Care, Other (non HMO) | Admitting: Obstetrics & Gynecology

## 2021-12-29 ENCOUNTER — Telehealth: Payer: Self-pay | Admitting: *Deleted

## 2021-12-29 DIAGNOSIS — Z8742 Personal history of other diseases of the female genital tract: Secondary | ICD-10-CM

## 2021-12-29 NOTE — Telephone Encounter (Signed)
Patient called reports she had spotting over the weekend, brown color,history of cyst and history of breast cancer. Patient reports no spotting today. Reports she did notice spotting when she had a cyst.  Do you want patient to schedule office visit only with you now? Or do you want to schedule pelvic ultrasound at next available date? Please advise   ?

## 2022-01-05 NOTE — Telephone Encounter (Signed)
Dr.Lavoie replied " Pelvic US at next available date." ? ?Patient aware, order placed.  ?

## 2022-01-09 NOTE — Telephone Encounter (Signed)
Patient scheduled on 01/13/22 for pelvic ultrasound.  ?

## 2022-01-13 ENCOUNTER — Ambulatory Visit (INDEPENDENT_AMBULATORY_CARE_PROVIDER_SITE_OTHER): Payer: Managed Care, Other (non HMO)

## 2022-01-13 ENCOUNTER — Encounter: Payer: Self-pay | Admitting: Obstetrics & Gynecology

## 2022-01-13 ENCOUNTER — Ambulatory Visit: Payer: Managed Care, Other (non HMO) | Admitting: Obstetrics & Gynecology

## 2022-01-13 ENCOUNTER — Other Ambulatory Visit: Payer: Self-pay

## 2022-01-13 VITALS — BP 118/70

## 2022-01-13 DIAGNOSIS — N950 Postmenopausal bleeding: Secondary | ICD-10-CM

## 2022-01-13 DIAGNOSIS — Z8742 Personal history of other diseases of the female genital tract: Secondary | ICD-10-CM

## 2022-01-13 DIAGNOSIS — N840 Polyp of corpus uteri: Secondary | ICD-10-CM

## 2022-01-13 NOTE — Progress Notes (Signed)
? ? ?  Holly Bennett 1965/12/31 [id number] ? ? ?     56 y.o.  G4P2L2  ? ?RP: PMB x 2 days in early March 2023 ? ?HPI:  PMB x 2 days in early March 2023.  PMB resolved completely since then.  No pelvic pain.  No vaginal discharge.  Urine/BMs normal.  No fever. ? ? ?OB History  ?Gravida Para Term Preterm AB Living  ?'4 2     2 2  '$ ?SAB IAB Ectopic Multiple Live Births  ?2          ?  ?# Outcome Date GA Lbr Len/2nd Weight Sex Delivery Anes PTL Lv  ?4 SAB           ?3 SAB           ?2 Para           ?1 Para           ? ? ?Past medical history,surgical history, problem list, medications, allergies, family history and social history were all reviewed and documented in the EPIC chart. ? ? ?Directed ROS with pertinent positives and negatives documented in the history of present illness/assessment and plan. ? ?Exam: ? ?Vitals:  ? 01/13/22 1502  ?BP: 118/70  ? ?General appearance:  Normal ? ?Pelvic US today: T/V images.  Retroverted uterus normal size and shape with no myometrial mass.  The uterus is measured at 7.64 x 5.3 x 3.99 cm.  The endometrial lining is symmetrical measuring approximately 2.5 mm both in the right and left forearm.  At the mid fundal portion of the endometrial canal 0.7 x 0.5 cm area is seen compatible with a possible endometrial polyp.  The right ovary is atrophic in appearance within normal limits.  The left ovary presents a collapsed cyst measuring 1.4 x 1.1 x 0.4 cm.  A small solid area with minimal vascularity is present.  No adnexal mass seen.  Trace free fluid in the pelvis. ? ? ?Assessment/Plan:  56 y.o. [id number]  ? ?1. Postmenopausal bleeding ?PMB x 2 days in early March 2023.  PMB resolved completely since then.  No pelvic pain.  No vaginal discharge.  Urine/BMs normal.  No fever.  Pelvic US findings thoroughly reviewed with patient.  The presence of a probable endometrial polyp discussed with patient.  Decision made to further investigate with a sonohysterogram at follow-up.  If endometrial polyp  is confirmed, we will proceed with a hysteroscopy with excision of the polyp.  Patient voiced understanding and agreement with plan. ?- [place] Sonohysterogram; Future ? ?2. Endometrial polyp ?- [place] Sonohysterogram; Future  ? ?Princess Bruins MD, 5:00 PM 01/14/2022 ? ? ? ?  ? ? ? ? ? ? ?

## 2022-01-15 ENCOUNTER — Encounter: Payer: Self-pay | Admitting: Obstetrics & Gynecology

## 2022-01-27 ENCOUNTER — Ambulatory Visit: Payer: Managed Care, Other (non HMO) | Admitting: Dermatology

## 2022-01-27 DIAGNOSIS — D2361 Other benign neoplasm of skin of right upper limb, including shoulder: Secondary | ICD-10-CM

## 2022-01-27 DIAGNOSIS — L988 Other specified disorders of the skin and subcutaneous tissue: Secondary | ICD-10-CM

## 2022-01-27 NOTE — Patient Instructions (Signed)

## 2022-01-27 NOTE — Progress Notes (Signed)
? ?  Follow-Up Visit ?  ?Subjective  ?Holly Bennett is a 56 y.o. female who presents for the following: Facial Elastosis (Patient here today for Botox injections. ). ? ?Patient also has a growth at right index finger that she would like removed. It has been sore and recently drained jelly-like material ? ?The following portions of the chart were reviewed this encounter and updated as appropriate:  ?  ?  ? ?Review of Systems:  No other skin or systemic complaints except as noted in HPI or Assessment and Plan. ? ?Objective  ?Well appearing patient in no apparent distress; mood and affect are within normal limits. ? ?A focused examination was performed including face, right hand. Relevant physical exam findings are noted in the Assessment and Plan. ? ?face ?Rhytides and volume loss.  ? ? ? ? ?right index DIP ?5 mm solitary, smooth skin colored firm papule.  ? ? ? ?Assessment & Plan  ?Elastosis of skin ?face ? ?Filling material injection - face ?Location: See attached image ? ?Informed consent: Discussed risks (infection, pain, bleeding, bruising, swelling, allergic reaction, paralysis of nearby muscles, eyelid droop, double vision, neck weakness, difficulty breathing, headache, undesirable cosmetic result, and need for additional treatment) and benefits of the procedure, as well as the alternatives.  Informed consent was obtained. ? ?Preparation: The area was cleansed with alcohol. ? ?Procedure Details:  Botox was injected into the dermis with a 30-gauge needle. Pressure applied to any bleeding. Ice packs offered for swelling. ? ?Lot Number:  [id number] ?Expiration:  01/2024 ? ?Total Units Injected:  20 ? ?Plan: Tylenol may be used for headache.  Allow 2 weeks before returning to clinic for additional dosing as needed. Patient will call for any problems. ? ? ?Benign neoplasm of skin of right upper extremity ?right index DIP ? ?Probable digital mucous cyst, wart less likely ? ?Discussed shave removal/bx vrs cryotherapy vrs  excision by hand surgeon ? ?Pt prefers cryotherapy.  Discussed that it won't clear lesion, but may shrink it so it is less irritating. ? ?Destruction of lesion - right index DIP ? ?Destruction method: cryotherapy   ?Informed consent: discussed and consent obtained   ?Lesion destroyed using liquid nitrogen: Yes   ?Region frozen until ice ball extended beyond lesion: Yes   ?Outcome: patient tolerated procedure well with no complications   ?Post-procedure details: wound care instructions given   ?Additional details:  Prior to procedure, discussed risks of blister formation, small wound, skin dyspigmentation, or rare scar following cryotherapy. Recommend Vaseline ointment to treated areas while healing.  ? ? ?Return for as scheduled. ? ?Graciella Belton, RMA, am acting as scribe for Brendolyn Patty, MD . ? ?Documentation: I have reviewed the above documentation for accuracy and completeness, and I agree with the above. ? ?Brendolyn Patty MD  ? ?

## 2022-02-17 ENCOUNTER — Other Ambulatory Visit: Payer: Managed Care, Other (non HMO)

## 2022-02-17 ENCOUNTER — Other Ambulatory Visit: Payer: Managed Care, Other (non HMO) | Admitting: Obstetrics & Gynecology

## 2022-03-05 ENCOUNTER — Ambulatory Visit: Payer: Managed Care, Other (non HMO)

## 2022-03-05 ENCOUNTER — Ambulatory Visit (INDEPENDENT_AMBULATORY_CARE_PROVIDER_SITE_OTHER): Payer: Managed Care, Other (non HMO) | Admitting: Obstetrics & Gynecology

## 2022-03-05 ENCOUNTER — Encounter: Payer: Self-pay | Admitting: Obstetrics & Gynecology

## 2022-03-05 DIAGNOSIS — D219 Benign neoplasm of connective and other soft tissue, unspecified: Secondary | ICD-10-CM

## 2022-03-05 DIAGNOSIS — N840 Polyp of corpus uteri: Secondary | ICD-10-CM

## 2022-03-05 DIAGNOSIS — N950 Postmenopausal bleeding: Secondary | ICD-10-CM

## 2022-03-05 NOTE — Progress Notes (Signed)
? ? ?KIELEY AKTER 16-Jul-1966 [id number] ? ? ?     56 y.o.  G4P2A2L2 ? ?RP: Postmenopausal bleeding for Sonohysto ? ?HPI:  PMB x 2 days in early March 2023.  PMB resolved completely since then.  No pelvic pain.  No vaginal discharge.  Urine/BMs normal.  No fever.  Last Pelvic US 01/13/22 showed a possible Polyp in the IU cavity. ? ? ?OB History  ?Gravida Para Term Preterm AB Living  ?'4 2     2 2  '$ ?SAB IAB Ectopic Multiple Live Births  ?2          ?  ?# Outcome Date GA Lbr Len/2nd Weight Sex Delivery Anes PTL Lv  ?4 SAB           ?3 SAB           ?2 Para           ?1 Para           ? ? ?Past medical history,surgical history, problem list, medications, allergies, family history and social history were all reviewed and documented in the EPIC chart. ? ? ?Directed ROS with pertinent positives and negatives documented in the history of present illness/assessment and plan. ? ?Exam: ? ?There were no vitals filed for this visit. ? ?General appearance:  Normal ? ?                                                                  Sono Infusion Hysterogram ( procedure note) ? ? ?The initial transvaginal ultrasound demonstrated the following: ? ?T/V images.  Comparison is made with previous scan January 13, 2022.  Retroverted uterus normal in size and shape with 2 small fibroids adjacent to the endometrial cavity.  Thin endometrium measured at 2.9 mm slightly irregular in area of fibroids.  The uterus is measured at 8.49 x 4.94 x 4.08.  Both ovaries are small with atrophic appearance.  The collapsed cyst seen March 21 on the left ovary has resolved.  Small amount of free fluid. ? ? ?The speculum  was inserted and the cervix cleansed with Betadine solution after confirming that patient has no allergies.A small sonohysterography catheterwas utilized.  Insertion was facilitated with ring forceps, using a spear-like motion the catheter was inserted to the fundus of the uterus. The speculum is then removed carefully to avoid dislodging  the catheter. ?The catheter was flushed with sterile saline delete prior to insertion to rid it of small amounts of air.the sterile saline solution was infused into the uterine cavity as a vaginal ultrasound probe was then placed in the vagina for full visualization of the uterine cavity from a transvaginal approach. The following was noted: ? ?Saline infusion reveals lower segment fibroid protruding into the cavity, otherwise smooth thin walls with combined thickness measured at 2.8 mm with no intracavitary mass identified. ? ?The catheter was then removed after retrieving some of the saline from the intrauterine cavity. An endometrial biopsy was not done. Patient tolerated procedure well. She had received a tablet of Aleve for discomfort.   ? ? ?Assessment/Plan:  56 y.o. G4P2L2  ? ?1. Postmenopausal bleeding ?PMB x 2 days in early March 2023.  PMB resolved completely since then.  No pelvic pain.  No vaginal discharge.  Urine/BMs normal.  No fever.  Last Pelvic US 01/13/22 showed a possible Polyp in the IU cavity.  Sonohysto today, showing lower segment fibroid protruding into the cavity, otherwise smooth thin walls with combined thickness measured at 2.8 mm with no intracavitary mass identified.  Sonohysto findings thoroughly reviewed.  Thin endometrial line with no Polyp visualized.  No indication to remove the fibroids as patient is entering menopause.  Patient reassured.  Decision to observe. ? ?2. Fibroids  ?Small uterine fibroids. ? ?Princess Bruins MD, 9:59 AM 03/05/2022 ? ? ? ?  ?

## 2022-03-06 ENCOUNTER — Encounter: Payer: Self-pay | Admitting: Obstetrics & Gynecology

## 2022-03-24 ENCOUNTER — Ambulatory Visit: Payer: Managed Care, Other (non HMO) | Admitting: Dermatology

## 2022-03-24 DIAGNOSIS — I8393 Asymptomatic varicose veins of bilateral lower extremities: Secondary | ICD-10-CM

## 2022-03-24 DIAGNOSIS — L570 Actinic keratosis: Secondary | ICD-10-CM

## 2022-03-24 DIAGNOSIS — L821 Other seborrheic keratosis: Secondary | ICD-10-CM

## 2022-03-24 DIAGNOSIS — Z1283 Encounter for screening for malignant neoplasm of skin: Secondary | ICD-10-CM | POA: Diagnosis not present

## 2022-03-24 DIAGNOSIS — D2371 Other benign neoplasm of skin of right lower limb, including hip: Secondary | ICD-10-CM

## 2022-03-24 DIAGNOSIS — D225 Melanocytic nevi of trunk: Secondary | ICD-10-CM | POA: Diagnosis not present

## 2022-03-24 DIAGNOSIS — D1800 Hemangioma unspecified site: Secondary | ICD-10-CM

## 2022-03-24 DIAGNOSIS — D239 Other benign neoplasm of skin, unspecified: Secondary | ICD-10-CM

## 2022-03-24 DIAGNOSIS — Z85828 Personal history of other malignant neoplasm of skin: Secondary | ICD-10-CM

## 2022-03-24 DIAGNOSIS — L814 Other melanin hyperpigmentation: Secondary | ICD-10-CM | POA: Diagnosis not present

## 2022-03-24 DIAGNOSIS — L578 Other skin changes due to chronic exposure to nonionizing radiation: Secondary | ICD-10-CM | POA: Diagnosis not present

## 2022-03-24 DIAGNOSIS — D485 Neoplasm of uncertain behavior of skin: Secondary | ICD-10-CM | POA: Diagnosis not present

## 2022-03-24 DIAGNOSIS — D229 Melanocytic nevi, unspecified: Secondary | ICD-10-CM

## 2022-03-24 HISTORY — DX: Other benign neoplasm of skin, unspecified: D23.9

## 2022-03-24 NOTE — Patient Instructions (Addendum)
Cryotherapy Aftercare  Wash gently with soap and water everyday.   Apply Vaseline and Band-Aid daily until healed.    Wound Care Instructions  Cleanse wound gently with soap and water once a day then pat dry with clean gauze. Apply a thing coat of Petrolatum (petroleum jelly, "Vaseline") over the wound (unless you have an allergy to this). We recommend that you use a new, sterile tube of Vaseline. Do not pick or remove scabs. Do not remove the yellow or white "healing tissue" from the base of the wound.  Cover the wound with fresh, clean, nonstick gauze and secure with paper tape. You may use Band-Aids in place of gauze and tape if the would is small enough, but would recommend trimming much of the tape off as there is often too much. Sometimes Band-Aids can irritate the skin.  You should call the office for your biopsy report after 1 week if you have not already been contacted.  If you experience any problems, such as abnormal amounts of bleeding, swelling, significant bruising, significant pain, or evidence of infection, please call the office immediately.  FOR ADULT SURGERY PATIENTS: If you need something for pain relief you may take 1 extra strength Tylenol (acetaminophen) AND 2 Ibuprofen ('200mg'$  each) together every 4 hours as needed for pain. (do not take these if you are allergic to them or if you have a reason you should not take them.) Typically, you may only need pain medication for 1 to 3 days.    Melanoma ABCDEs  Melanoma is the most dangerous type of skin cancer, and is the leading cause of death from skin disease.  You are more likely to develop melanoma if you: Have light-colored skin, light-colored eyes, or red or blond hair Spend a lot of time in the sun Tan regularly, either outdoors or in a tanning bed Have had blistering sunburns, especially during childhood Have a close family member who has had a melanoma Have atypical moles or large birthmarks  Early detection of  melanoma is key since treatment is typically straightforward and cure rates are extremely high if we catch it early.   The first sign of melanoma is often a change in a mole or a new dark spot.  The ABCDE system is a way of remembering the signs of melanoma.  A for asymmetry:  The two halves do not match. B for border:  The edges of the growth are irregular. C for color:  A mixture of colors are present instead of an even brown color. D for diameter:  Melanomas are usually (but not always) greater than 71m - the size of a pencil eraser. E for evolution:  The spot keeps changing in size, shape, and color.  Please check your skin once per month between visits. You can use a small mirror in front and a large mirror behind you to keep an eye on the back side or your body.   If you see any new or changing lesions before your next follow-up, please call to schedule a visit.  Please continue daily skin protection including broad spectrum sunscreen SPF 30+ to sun-exposed areas, reapplying every 2 hours as needed when you're outdoors.    If You Need Anything After Your Visit  If you have any questions or concerns for your doctor, please call our main line at [phone] press option 4 to reach your doctor's medical assistant. If no one answers, please leave a voicemail as directed and we will return your  call as soon as possible. Messages left after 4 pm will be answered the following business day.   You may also send [place] a message via [place]. We typically respond to MyChart messages within 1-2 business days.  For prescription refills, please ask your pharmacy to contact our office. Our fax number is [phone].  If you have an urgent issue when the clinic is closed that cannot wait until the next business day, you can page your doctor at the number below.    Please note that while we do our best to be available for urgent issues outside of office hours, we are not available 24/7.   If you  have an urgent issue and are unable to reach [place], you may choose to seek medical care at your doctor's office, retail clinic, urgent care center, or emergency room.  If you have a medical emergency, please immediately call 911 or go to the emergency department.  Pager Numbers  - Dr. Nehemiah Massed: [phone]  - Dr. Laurence Ferrari: [phone]  - Dr. Nicole Kindred: [phone]  In the event of inclement weather, please call our main line at [phone] for an update on the status of any delays or closures.  Dermatology Medication Tips: Please keep the boxes that topical medications come in in order to help keep track of the instructions about where and how to use these. Pharmacies typically print the medication instructions only on the boxes and not directly on the medication tubes.   If your medication is too expensive, please contact our office at [phone] option 4 or send [place] a message through [place].   We are unable to tell what your co-pay for medications will be in advance as this is different depending on your insurance coverage. However, we may be able to find a substitute medication at lower cost or fill out paperwork to get insurance to cover a needed medication.   If a prior authorization is required to get your medication covered by your insurance company, please allow [place] 1-2 business days to complete this process.  Drug prices often vary depending on where the prescription is filled and some pharmacies may offer cheaper prices.  The website www.goodrx.com contains coupons for medications through different pharmacies. The prices here do not account for what the cost may be with help from insurance (it may be cheaper with your insurance), but the website can give you the price if you did not use any insurance.  - You can print the associated coupon and take it with your prescription to the pharmacy.  - You may also stop by our office during regular business hours and pick up a GoodRx coupon  card.  - If you need your prescription sent electronically to a different pharmacy, notify our office through [facility] or by phone at [phone] option 4.     Si Usted Necesita Algo Despus de Su Visita  Tambin puede enviarnos un mensaje a travs de Pharmacist, community. Por lo general respondemos a los mensajes de MyChart en el transcurso de 1 a 2 das hbiles.  Para renovar recetas, por favor pida a su farmacia que se ponga en contacto con nuestra oficina. Harland Dingwall de fax es [place] [phone].  Si tiene un asunto urgente cuando la clnica est cerrada y que no puede esperar hasta el siguiente da hbil, puede llamar/localizar a su doctor(a) al nmero que aparece a continuacin.   Por favor, tenga en cuenta que aunque hacemos todo lo posible para estar disponibles para asuntos urgentes fuera  del horario de oficina, no estamos disponibles las 24 horas del da, los 7 das de la [place].   Si tiene un problema urgente y no puede comunicarse con nosotros, puede optar por buscar atencin mdica  en el consultorio de su doctor(a), en una clnica privada, en un centro de atencin urgente o en una sala de emergencias.  Si tiene Engineering geologist, por favor llame inmediatamente al 911 o vaya a la sala de emergencias.  Nmeros de bper  - Dr. Nehemiah Massed: [phone]  - Dra. Moye: [phone]  - Dra. Nicole Kindred: [phone]  En caso de inclemencias del [place], por favor llame a Johnsie Kindred principal al [phone] para una actualizacin sobre el [place] de cualquier retraso o cierre.  Consejos para la medicacin en dermatologa: Por favor, guarde las cajas en las que vienen los medicamentos de uso tpico para ayudarle a seguir las instrucciones sobre dnde y cmo usarlos. Las farmacias generalmente imprimen las instrucciones del medicamento slo en las cajas y no directamente en los tubos del [place].   Si su medicamento es muy caro, por favor, pngase en contacto con Zigmund Daniel llamando al [phone] y presione la opcin 4 o envenos un mensaje a travs de Pharmacist, community.   No podemos decirle cul ser su copago por los medicamentos por adelantado ya que esto es diferente dependiendo de la cobertura de su seguro. Sin embargo, es posible que podamos encontrar un medicamento sustituto a Electrical engineer un formulario para que el seguro cubra el medicamento que se considera necesario.   Si se requiere una autorizacin previa para que su compaa de seguros [place] su medicamento, por favor permtanos de 1 a 2 das hbiles para completar este proceso.  Los precios de los medicamentos varan con frecuencia dependiendo del Environmental consultant de dnde se surte la receta y alguna farmacias pueden ofrecer precios ms baratos.  El sitio web www.goodrx.com tiene cupones para medicamentos de Airline pilot. Los precios aqu no tienen en cuenta lo que podra costar con la ayuda del seguro (puede ser ms barato con su seguro), pero el sitio web puede darle el precio si no utiliz Research scientist (physical sciences).  - Puede imprimir el cupn correspondiente y llevarlo con su receta a la farmacia.  - Tambin puede pasar por nuestra oficina durante el horario de atencin regular y Charity fundraiser una tarjeta de cupones de GoodRx.  - Si necesita que su receta se enve electrnicamente a una farmacia diferente, informe a nuestra oficina a travs de MyChart de [place] o por telfono llamando al [phone] y presione la opcin 4.

## 2022-03-24 NOTE — Progress Notes (Signed)
Follow-Up Visit   Subjective  Holly Bennett is a 56 y.o. female who presents for the following: Annual Exam (The patient presents for Total-Body Skin Exam (TBSE) for skin cancer screening and mole check.  The patient has spots, moles and lesions to be evaluated, some may be new or changing and the patient has concerns that these could be cancer. Patient has hx of BCC. /).  Patient does have a spot at right calf, she is not sure how long it's been there, asymptomatic.   The following portions of the chart were reviewed this encounter and updated as appropriate:       Review of Systems:  No other skin or systemic complaints except as noted in HPI or Assessment and Plan.  Objective  Well appearing patient in no apparent distress; mood and affect are within normal limits.  A full examination was performed including scalp, head, eyes, ears, nose, lips, neck, chest, axillae, abdomen, back, buttocks, bilateral upper extremities, bilateral lower extremities, hands, feet, fingers, toes, fingernails, and toenails. All findings within normal limits unless otherwise noted below.  Right Lateral Breast Right Lateral Breast: 5 x 62m medium dark brown macule, present for years without change         right posterior ankle 3 mm light tan waxy papule  Left Upper Axilla 2.023mmed dark brown papule     Right Upper Abdomen 5 x 3 mm medium dark brown macule       nasal tip x 2 (2) Erythematous thin papules/macules with gritty scale.     Assessment & Plan  Nevus Right Lateral Breast  Benign-appearing.  Observation.  Call clinic for new or changing moles.  Recommend daily use of broad spectrum spf 30+ sunscreen to sun-exposed areas.    Seborrheic keratosis right posterior ankle  Vs Dermatofibroma  Benign-appearing.  Observation.  Call clinic for new or changing lesions.  Recommend daily use of broad spectrum spf 30+ sunscreen to sun-exposed areas.    Neoplasm of uncertain  behavior of skin (2) Left Upper Axilla  Epidermal / dermal shaving  Lesion diameter (cm):  0.5 Informed consent: discussed and consent obtained   Patient was prepped and draped in usual sterile fashion: Area prepped with alcohol. Anesthesia: the lesion was anesthetized in a standard fashion   Anesthetic:  1% lidocaine w/ epinephrine 1-100,000 buffered w/ 8.4% NaHCO3 Instrument used: flexible razor blade   Hemostasis achieved with: pressure, aluminum chloride and electrodesiccation   Outcome: patient tolerated procedure well   Post-procedure details: wound care instructions given   Post-procedure details comment:  Ointment and small bandage applied.   Specimen 1 - Surgical pathology Differential Diagnosis: Nevus r/o Dysplasia  Check Margins: No 2.57m65med dark brown papule  Right Upper Abdomen  Epidermal / dermal shaving  Lesion diameter (cm):  0.7 Informed consent: discussed and consent obtained   Patient was prepped and draped in usual sterile fashion: Area prepped with alcohol. Anesthesia: the lesion was anesthetized in a standard fashion   Anesthetic:  1% lidocaine w/ epinephrine 1-100,000 buffered w/ 8.4% NaHCO3 Instrument used: flexible razor blade   Hemostasis achieved with: pressure, aluminum chloride and electrodesiccation   Outcome: patient tolerated procedure well   Post-procedure details: wound care instructions given   Post-procedure details comment:  Ointment and small bandage applied.   Specimen 2 - Surgical pathology Differential Diagnosis: Nevus r/o Dysplasia  Check Margins: No 5 x 3 mm medium dark brown macule  AK (actinic keratosis) (2) nasal tip x 2  Actinic keratoses are precancerous spots that appear secondary to cumulative UV radiation exposure/sun exposure over time. They are chronic with expected duration over 1 year. A portion of actinic keratoses will progress to squamous cell carcinoma of the skin. It is not possible to reliably predict which  spots will progress to skin cancer and so treatment is recommended to prevent development of skin cancer.  Recommend daily broad spectrum sunscreen SPF 30+ to sun-exposed areas, reapply every 2 hours as needed.  Recommend staying in the shade or wearing long sleeves, sun glasses (UVA+UVB protection) and wide brim hats (4-inch brim around the entire circumference of the hat). Call for new or changing lesions.   Destruction of lesion - nasal tip x 2  Destruction method: cryotherapy   Informed consent: discussed and consent obtained   Lesion destroyed using liquid nitrogen: Yes   Region frozen until ice ball extended beyond lesion: Yes   Outcome: patient tolerated procedure well with no complications   Post-procedure details: wound care instructions given   Additional details:  Prior to procedure, discussed risks of blister formation, small wound, skin dyspigmentation, or rare scar following cryotherapy. Recommend Vaseline ointment to treated areas while healing.    Lentigines - Scattered tan macules - Due to sun exposure - Benign-appearing, observe - Recommend daily broad spectrum sunscreen SPF 30+ to sun-exposed areas, reapply every 2 hours as needed. - Call for any changes  Seborrheic Keratoses - Stuck-on, waxy, tan-brown papules and/or plaques  - Benign-appearing - Discussed benign etiology and prognosis. - Observe - Call for any changes  Melanocytic Nevi - Tan-brown and/or pink-flesh-colored symmetric macules and papules - Benign appearing on exam today - Observation - Call clinic for new or changing moles - Recommend daily use of broad spectrum spf 30+ sunscreen to sun-exposed areas.   Hemangiomas - Red papules - Discussed benign nature - Observe - Call for any changes  Actinic Damage - Chronic condition, secondary to cumulative UV/sun exposure - diffuse scaly erythematous macules with underlying dyspigmentation - Recommend daily broad spectrum sunscreen SPF 30+ to  sun-exposed areas, reapply every 2 hours as needed.  - Staying in the shade or wearing long sleeves, sun glasses (UVA+UVB protection) and wide brim hats (4-inch brim around the entire circumference of the hat) are also recommended for sun protection.  - Call for new or changing lesions.  Dermatofibroma - Firm pink/brown papulenodule with dimple sign at right medial thigh - Benign appearing - Call for any changes  Varicose Veins/Spider Veins - Dilated blue, purple or red veins at the lower extremities - Reassured - Smaller vessels can be treated by sclerotherapy (a procedure to inject a medicine into the veins to make them disappear) if desired, but the treatment is not covered by insurance. Larger vessels may be covered if symptomatic and we would refer to vascular surgeon if treatment desired.  Skin cancer screening performed today.  History of Basal Cell Carcinoma of the Skin - No evidence of recurrence today - Recommend regular full body skin exams - Recommend daily broad spectrum sunscreen SPF 30+ to sun-exposed areas, reapply every 2 hours as needed.  - Call if any new or changing lesions are noted between office visits  Return in about 1 year (around 03/25/2023) for TBSE.  Graciella Belton, RMA, am acting as scribe for Brendolyn Patty, MD .  Documentation: I have reviewed the above documentation for accuracy and completeness, and I agree with the above.  Brendolyn Patty MD

## 2022-03-30 ENCOUNTER — Telehealth: Payer: Self-pay

## 2022-03-30 NOTE — Telephone Encounter (Signed)
-----   Message from Brendolyn Patty, MD sent at 03/30/2022 11:15 AM EDT ----- 1. Skin , left upper axilla MELANOCYTIC NEVUS, COMPOUND TYPE, CLOSE TO MARGIN 2. Skin , right upper abdomen DYSPLASTIC COMPOUND NEVUS WITH MODERATE ATYPIA, CLOSE TO MARGIN  1. Benign mole 2. Moderately atypical mole, will observe for recurrence   - please call patient

## 2022-03-30 NOTE — Telephone Encounter (Signed)
Advised pt of bx results/sh ?

## 2022-06-11 ENCOUNTER — Ambulatory Visit: Payer: Managed Care, Other (non HMO) | Admitting: Radiation Oncology

## 2022-08-11 ENCOUNTER — Other Ambulatory Visit: Payer: Self-pay | Admitting: Obstetrics & Gynecology

## 2022-08-11 DIAGNOSIS — Z1231 Encounter for screening mammogram for malignant neoplasm of breast: Secondary | ICD-10-CM

## 2022-08-12 ENCOUNTER — Ambulatory Visit: Payer: Self-pay | Admitting: Radiation Oncology

## 2022-10-01 ENCOUNTER — Ambulatory Visit: Payer: Managed Care, Other (non HMO) | Admitting: Obstetrics & Gynecology

## 2022-10-05 ENCOUNTER — Encounter: Payer: Self-pay | Admitting: Obstetrics & Gynecology

## 2022-10-05 ENCOUNTER — Ambulatory Visit (INDEPENDENT_AMBULATORY_CARE_PROVIDER_SITE_OTHER): Payer: No Typology Code available for payment source | Admitting: Obstetrics & Gynecology

## 2022-10-05 ENCOUNTER — Ambulatory Visit: Payer: Managed Care, Other (non HMO) | Admitting: Obstetrics & Gynecology

## 2022-10-05 ENCOUNTER — Other Ambulatory Visit (HOSPITAL_COMMUNITY)
Admission: RE | Admit: 2022-10-05 | Discharge: 2022-10-05 | Disposition: A | Discharge status: Home / Self Care | Payer: No Typology Code available for payment source | Source: Ambulatory Visit | Attending: Obstetrics & Gynecology | Admitting: Obstetrics & Gynecology

## 2022-10-05 VITALS — BP 122/84 | HR 71 | Ht 64.25 in | Wt 165.0 lb

## 2022-10-05 DIAGNOSIS — Z01419 Encounter for gynecological examination (general) (routine) without abnormal findings: Secondary | ICD-10-CM | POA: Diagnosis not present

## 2022-10-05 DIAGNOSIS — C50112 Malignant neoplasm of central portion of left female breast: Secondary | ICD-10-CM | POA: Diagnosis not present

## 2022-10-05 DIAGNOSIS — Z170 Estrogen receptor positive status [ER+]: Secondary | ICD-10-CM | POA: Diagnosis not present

## 2022-10-05 DIAGNOSIS — Z780 Asymptomatic menopausal state: Secondary | ICD-10-CM | POA: Diagnosis not present

## 2022-10-05 NOTE — Progress Notes (Signed)
Holly Bennett January 28, 1966 [id number]   History:    56 y.o. G4P2A2L2 Married.  Girls 56 yo and 56 yo (Getting married)   RP:  Established patient presenting for annual gyn exam    HPI:  Left breast Ca post Lumpectomy/Sentinel LN Bx on 11/17/2017 and Radiation Therapy.  Stopped Tamoxifen x 03/2021.  No PMB x 02/2022 when a Sonohysto showed a combined endometrial thickness at 2.8 mm with no IU lesions except for a partially SM Fibroid.  No pelvic pain. No pain with IC.  Pap Atypia with atrophy/HPV HR Neg in 09/2021.  Pap reflex today. Breasts normal.  Mammo Neg 09/2021, scheduled this December.  Urine/BMs normal. BMI 28.1.  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  Will f/u here for Fasting Health labs.  Colono 10/2018 repeat at 5 years.   Past medical history,surgical history, family history and social history were all reviewed and documented in the EPIC chart.  Gynecologic History Patient's last menstrual period was 03/31/2018.  Obstetric History OB History  Gravida Para Term Preterm AB Living  _0 SAB IAB Ectopic Multiple Live Births  2            # Outcome Date GA Lbr Len/2nd Weight Sex Delivery Anes PTL Lv  4 SAB           3 SAB           2 Para           1 Para              ROS: A ROS was performed and pertinent positives and negatives are included in the history. GENERAL: No fevers or chills. HEENT: No change in vision, no earache, sore throat or sinus congestion. NECK: No pain or stiffness. CARDIOVASCULAR: No chest pain or pressure. No palpitations. PULMONARY: No shortness of breath, cough or wheeze. GASTROINTESTINAL: No abdominal pain, nausea, vomiting or diarrhea, melena or bright red blood per rectum. GENITOURINARY: No urinary frequency, urgency, hesitancy or dysuria. MUSCULOSKELETAL: No joint or muscle pain, no back pain, no recent trauma. DERMATOLOGIC: No rash, no itching, no lesions. ENDOCRINE: No polyuria, polydipsia, no heat or cold intolerance. No recent change in  weight. HEMATOLOGICAL: No anemia or easy bruising or bleeding. NEUROLOGIC: No headache, seizures, numbness, tingling or weakness. PSYCHIATRIC: No depression, no loss of interest in normal activity or change in sleep pattern.     Exam:   BP 122/84   Pulse 71   Ht 5' 4.25" (1.632 m)   Wt 165 lb (74.8 kg)   LMP 03/31/2018 Comment: no period since tamoxifen  SpO2 98%   BMI 28.10 kg/m   Body mass index is 28.1 kg/m.  General appearance : Well developed well nourished female. No acute distress HEENT: Eyes: no retinal hemorrhage or exudates,  Neck supple, trachea midline, no carotid bruits, no thyroidmegaly Lungs: Clear to auscultation, no rhonchi or wheezes, or rib retractions  Heart: Regular rate and rhythm, no murmurs or gallops Breast:Examined in sitting and supine position were symmetrical in appearance, no palpable masses or tenderness,  no skin retraction, no nipple inversion, no nipple discharge, no skin discoloration, no axillary or supraclavicular lymphadenopathy Abdomen: no palpable masses or tenderness, no rebound or guarding Extremities: no edema or skin discoloration or tenderness  Pelvic: Vulva: Normal             Vagina: No gross lesions or discharge  Cervix: No gross lesions or discharge.  Pap reflex done.  Uterus  AV, normal size, shape and consistency, non-tender and mobile  Adnexa  Without masses or tenderness  Anus: Normal   Assessment/Plan:  56 y.o. female for annual exam   1. Encounter for routine gynecological examination with Papanicolaou smear of cervix Left breast Ca post Lumpectomy/Sentinel LN Bx on 11/17/2017 and Radiation Therapy.  Stopped Tamoxifen x 03/2021.  No PMB x 02/2022 when a Sonohysto showed a combined endometrial thickness at 2.8 mm with no IU lesions except for a partially SM Fibroid.  No pelvic pain. No pain with IC.  Pap Atypia with atrophy/HPV HR Neg in 09/2021.  Pap reflex today. Breasts normal.  Mammo Neg 09/2021, scheduled this December.   Urine/BMs normal. BMI 28.1.  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  Will f/u here for Fasting Health labs.  Colono 10/2018 repeat at 5 years. - CBC; Future - Comp Met (CMET); Future - Lipid Profile; Future - TSH; Future - Vitamin D (25 hydroxy); Future - Cytology - PAP( [place])  2. Postmenopause Left breast Ca post Lumpectomy/Sentinel LN Bx on 11/17/2017 and Radiation Therapy.  Stopped Tamoxifen x 03/2021.  No PMB x 02/2022 when a Sonohysto showed a combined endometrial thickness at 2.8 mm with no IU lesions except for a partially SM Fibroid.  No pelvic pain. No pain with IC.   3. Malignant neoplasm of central portion of left breast in female, estrogen receptor positive (HCC)   Princess Bruins MD, 4:17 PM

## 2022-10-13 LAB — CYTOLOGY - PAP: Diagnosis: NEGATIVE

## 2022-10-14 ENCOUNTER — Ambulatory Visit
Admission: RE | Admit: 2022-10-14 | Discharge: 2022-10-14 | Disposition: A | Discharge status: Home / Self Care | Payer: No Typology Code available for payment source | Source: Ambulatory Visit | Attending: Obstetrics & Gynecology | Admitting: Obstetrics & Gynecology

## 2022-10-14 DIAGNOSIS — Z1231 Encounter for screening mammogram for malignant neoplasm of breast: Secondary | ICD-10-CM | POA: Insufficient documentation

## 2022-10-22 ENCOUNTER — Other Ambulatory Visit: Payer: No Typology Code available for payment source

## 2022-10-22 DIAGNOSIS — C50912 Malignant neoplasm of unspecified site of left female breast: Secondary | ICD-10-CM | POA: Diagnosis not present

## 2022-10-22 DIAGNOSIS — Z01419 Encounter for gynecological examination (general) (routine) without abnormal findings: Secondary | ICD-10-CM | POA: Diagnosis not present

## 2022-10-22 DIAGNOSIS — Z780 Asymptomatic menopausal state: Secondary | ICD-10-CM | POA: Diagnosis not present

## 2022-10-22 DIAGNOSIS — E785 Hyperlipidemia, unspecified: Secondary | ICD-10-CM | POA: Diagnosis not present

## 2022-10-22 DIAGNOSIS — E559 Vitamin D deficiency, unspecified: Secondary | ICD-10-CM | POA: Diagnosis not present

## 2022-10-23 LAB — COMPREHENSIVE METABOLIC PANEL
AG Ratio: 1.7 (calc) (ref 1.0–2.5)
ALT: 17 U/L (ref 6–29)
AST: 14 U/L (ref 10–35)
Albumin: 4.5 g/dL (ref 3.6–5.1)
Alkaline phosphatase (APISO): 118 U/L (ref 37–153)
BUN: 20 mg/dL (ref 7–25)
CO2: 27 mmol/L (ref 20–32)
Calcium: 9.4 mg/dL (ref 8.6–10.4)
Chloride: 106 mmol/L (ref 98–110)
Creat: 0.67 mg/dL (ref 0.50–1.03)
Globulin: 2.6 g/dL (calc) (ref 1.9–3.7)
Glucose, Bld: 87 mg/dL (ref 65–99)
Potassium: 4.2 mmol/L (ref 3.5–5.3)
Sodium: 140 mmol/L (ref 135–146)
Total Bilirubin: 0.4 mg/dL (ref 0.2–1.2)
Total Protein: 7.1 g/dL (ref 6.1–8.1)

## 2022-10-23 LAB — LIPID PANEL
Cholesterol: 252 mg/dL — ABNORMAL HIGH (ref <200)
HDL: 87 mg/dL (ref >=50)
LDL Cholesterol (Calc): 149 mg/dL (calc) — ABNORMAL HIGH
Non-HDL Cholesterol (Calc): 165 mg/dL (calc) — ABNORMAL HIGH (ref <130)
Total CHOL/HDL Ratio: 2.9 (calc) (ref <5.0)
Triglycerides: 65 mg/dL (ref <150)

## 2022-10-23 LAB — TSH: TSH: 3.13 mIU/L (ref 0.40–4.50)

## 2022-10-23 LAB — CBC
HCT: 39.9 % (ref 35.0–45.0)
Hemoglobin: 13.7 g/dL (ref 11.7–15.5)
MCH: 30.1 pg (ref 27.0–33.0)
MCHC: 34.3 g/dL (ref 32.0–36.0)
MCV: 87.7 fL (ref 80.0–100.0)
MPV: 10.1 fL (ref 7.5–12.5)
Platelets: 285 10*3/uL (ref 140–400)
RBC: 4.55 10*6/uL (ref 3.80–5.10)
RDW: 12.4 % (ref 11.0–15.0)
WBC: 5.3 10*3/uL (ref 3.8–10.8)

## 2022-10-23 LAB — VITAMIN D 25 HYDROXY (VIT D DEFICIENCY, FRACTURES): Vit D, 25-Hydroxy: 28 ng/mL — ABNORMAL LOW (ref 30–100)

## 2023-02-08 ENCOUNTER — Ambulatory Visit: Payer: No Typology Code available for payment source | Admitting: Dermatology

## 2023-02-08 ENCOUNTER — Encounter: Payer: Self-pay | Admitting: Dermatology

## 2023-02-08 VITALS — BP 132/86 | HR 78

## 2023-02-08 DIAGNOSIS — L820 Inflamed seborrheic keratosis: Secondary | ICD-10-CM | POA: Diagnosis not present

## 2023-02-08 DIAGNOSIS — L988 Other specified disorders of the skin and subcutaneous tissue: Secondary | ICD-10-CM

## 2023-02-08 NOTE — Progress Notes (Signed)
   Follow-Up Visit   Subjective  Holly Bennett is a 57 y.o. female who presents for the following: Botox for facial elastosis Check lesion top of left ear. Irritated, growing. Dur: 6 months  The following portions of the chart were reviewed this encounter and updated as appropriate: medications, allergies, medical history  Review of Systems:  No other skin or systemic complaints except as noted in HPI or Assessment and Plan.  Objective  Well appearing patient in no apparent distress; mood and affect are within normal limits.  A focused examination was performed of the face.  Relevant physical exam findings are noted in the Assessment and Plan.  Injection map photo      Assessment & Plan    Facial Elastosis  Location: See attached image  Informed consent: Discussed risks (infection, pain, bleeding, bruising, swelling, allergic reaction, paralysis of nearby muscles, eyelid droop, double vision, neck weakness, difficulty breathing, headache, undesirable cosmetic result, and need for additional treatment) and benefits of the procedure, as well as the alternatives.  Informed consent was obtained.  Preparation: The area was cleansed with alcohol.  Procedure Details:  Botox was injected into the dermis with a 30-gauge needle. Pressure applied to any bleeding. Ice packs offered for swelling.  Lot Number:  [id number]   Expiration:  01/2025  Total Units Injected:  25  Plan: Tylenol may be used for headache.  Allow 2 weeks before returning to clinic for additional dosing as needed. Patient will call for any problems.   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  Benign-appearing.  Call clinic for new or changing lesions.   Prior to procedure, discussed risks of blister formation, small wound, skin dyspigmentation, or rare scar following treatment. Recommend Vaseline  ointment to treated areas while healing.  Destruction Procedure Note Destruction method: cryotherapy   Informed consent: discussed and consent obtained   Lesion destroyed using liquid nitrogen: Yes   Outcome: patient tolerated procedure well with no complications   Post-procedure details: wound care instructions given   Locations: Left anterior upper helix and antihelix  # of Lesions Treated: 2   Return for Botox 6 mos, TBSE as scheduled.  I, Lawson Radar, CMA, am acting as scribe for Willeen Niece, MD.   Documentation: I have reviewed the above documentation for accuracy and completeness, and I agree with the above.  Willeen Niece, MD

## 2023-02-08 NOTE — Patient Instructions (Addendum)
Cryotherapy Aftercare  Wash gently with soap and water everyday.   Apply Vaseline and Band-Aid daily until healed.     Due to recent changes in healthcare laws, you may see results of your pathology and/or laboratory studies on MyChart before the doctors have had a chance to review them. We understand that in some cases there may be results that are confusing or concerning to you. Please understand that not all results are received at the same time and often the doctors may need to interpret multiple results in order to provide you with the best plan of care or course of treatment. Therefore, we ask that you please give us 2 business days to thoroughly review all your results before contacting the office for clarification. Should we see a critical lab result, you will be contacted sooner.   If You Need Anything After Your Visit  If you have any questions or concerns for your doctor, please call our main line at [phone] and press option 4 to reach your doctor's medical assistant. If no one answers, please leave a voicemail as directed and we will return your call as soon as possible. Messages left after 4 pm will be answered the following business day.   You may also send us a message via MyChart. We typically respond to MyChart messages within 1-2 business days.  For prescription refills, please ask your pharmacy to contact our office. Our fax number is [phone].  If you have an urgent issue when the clinic is closed that cannot wait until the next business day, you can page your doctor at the number below.    Please note that while we do our best to be available for urgent issues outside of office hours, we are not available 24/7.   If you have an urgent issue and are unable to reach us, you may choose to seek medical care at your doctor's office, retail clinic, urgent care center, or emergency room.  If you have a medical emergency, please immediately call 911 or go to the  emergency department.  Pager Numbers  - Dr. Kowalski: [phone]  - Dr. Moye: [phone]  - Dr. Stewart: [phone]  In the event of inclement weather, please call our main line at [phone] for an update on the status of any delays or closures.  Dermatology Medication Tips: Please keep the boxes that topical medications come in in order to help keep track of the instructions about where and how to use these. Pharmacies typically print the medication instructions only on the boxes and not directly on the medication tubes.   If your medication is too expensive, please contact our office at [phone] option 4 or send us a message through MyChart.   We are unable to tell what your co-pay for medications will be in advance as this is different depending on your insurance coverage. However, we may be able to find a substitute medication at lower cost or fill out paperwork to get insurance to cover a needed medication.   If a prior authorization is required to get your medication covered by your insurance company, please allow us 1-2 business days to complete this process.  Drug prices often vary depending on where the prescription is filled and some pharmacies may offer cheaper prices.  The website www.goodrx.com contains coupons for medications through different pharmacies. The prices here do not account for what the cost may be with help from insurance (it may be cheaper with your insurance), but the website can   give you the price if you did not use any insurance.  - You can print the associated coupon and take it with your prescription to the pharmacy.  - You may also stop by our office during regular business hours and pick up a GoodRx coupon card.  - If you need your prescription sent electronically to a different pharmacy, notify our office through [facility] MyChart or by phone at [phone] option 4.     Si Usted Necesita Algo Despus de Su Visita  Tambin puede  enviarnos un mensaje a travs de MyChart. Por lo general respondemos a los mensajes de MyChart en el transcurso de 1 a 2 das hbiles.  Para renovar recetas, por favor pida a su farmacia que se ponga en contacto con nuestra oficina. Nuestro nmero de fax es el [phone].  Si tiene un asunto urgente cuando la clnica est cerrada y que no puede esperar hasta el siguiente da hbil, puede llamar/localizar a su doctor(a) al nmero que aparece a continuacin.   Por favor, tenga en cuenta que aunque hacemos todo lo posible para estar disponibles para asuntos urgentes fuera del horario de oficina, no estamos disponibles las 24 horas del da, los 7 das de la semana.   Si tiene un problema urgente y no puede comunicarse con nosotros, puede optar por buscar atencin mdica  en el consultorio de su doctor(a), en una clnica privada, en un centro de atencin urgente o en una sala de emergencias.  Si tiene una emergencia mdica, por favor llame inmediatamente al 911 o vaya a la sala de emergencias.  Nmeros de bper  - Dr. Kowalski: [phone]  - Dra. Moye: [phone]  - Dra. Stewart: [phone]  En caso de inclemencias del tiempo, por favor llame a nuestra lnea principal al [phone] para una actualizacin sobre el estado de cualquier retraso o cierre.  Consejos para la medicacin en dermatologa: Por favor, guarde las cajas en las que vienen los medicamentos de uso tpico para ayudarle a seguir las instrucciones sobre dnde y cmo usarlos. Las farmacias generalmente imprimen las instrucciones del medicamento slo en las cajas y no directamente en los tubos del medicamento.   Si su medicamento es muy caro, por favor, pngase en contacto con nuestra oficina llamando al [phone] y presione la opcin 4 o envenos un mensaje a travs de MyChart.   No podemos decirle cul ser su copago por los medicamentos por adelantado ya que esto es diferente dependiendo de la cobertura de su seguro.  Sin embargo, es posible que podamos encontrar un medicamento sustituto a menor costo o llenar un formulario para que el seguro cubra el medicamento que se considera necesario.   Si se requiere una autorizacin previa para que su compaa de seguros cubra su medicamento, por favor permtanos de 1 a 2 das hbiles para completar este proceso.  Los precios de los medicamentos varan con frecuencia dependiendo del lugar de dnde se surte la receta y alguna farmacias pueden ofrecer precios ms baratos.  El sitio web www.goodrx.com tiene cupones para medicamentos de diferentes farmacias. Los precios aqu no tienen en cuenta lo que podra costar con la ayuda del seguro (puede ser ms barato con su seguro), pero el sitio web puede darle el precio si no utiliz ningn seguro.  - Puede imprimir el cupn correspondiente y llevarlo con su receta a la farmacia.  - Tambin puede pasar por nuestra oficina durante el horario de atencin regular y recoger una tarjeta de cupones de GoodRx.  -   Si necesita que su receta se enve electrnicamente a una farmacia diferente, informe a nuestra oficina a travs de MyChart de [place] o por telfono llamando al [phone] y presione la opcin 4.  

## 2023-03-30 ENCOUNTER — Ambulatory Visit: Payer: No Typology Code available for payment source | Admitting: Dermatology

## 2023-03-30 ENCOUNTER — Encounter: Payer: Self-pay | Admitting: Dermatology

## 2023-03-30 VITALS — BP 131/82 | HR 77

## 2023-03-30 DIAGNOSIS — W908XXA Exposure to other nonionizing radiation, initial encounter: Secondary | ICD-10-CM | POA: Diagnosis not present

## 2023-03-30 DIAGNOSIS — Z1283 Encounter for screening for malignant neoplasm of skin: Secondary | ICD-10-CM | POA: Diagnosis not present

## 2023-03-30 DIAGNOSIS — D1801 Hemangioma of skin and subcutaneous tissue: Secondary | ICD-10-CM

## 2023-03-30 DIAGNOSIS — L814 Other melanin hyperpigmentation: Secondary | ICD-10-CM

## 2023-03-30 DIAGNOSIS — D225 Melanocytic nevi of trunk: Secondary | ICD-10-CM | POA: Diagnosis not present

## 2023-03-30 DIAGNOSIS — X32XXXA Exposure to sunlight, initial encounter: Secondary | ICD-10-CM

## 2023-03-30 DIAGNOSIS — D2371 Other benign neoplasm of skin of right lower limb, including hip: Secondary | ICD-10-CM

## 2023-03-30 DIAGNOSIS — L821 Other seborrheic keratosis: Secondary | ICD-10-CM | POA: Diagnosis not present

## 2023-03-30 DIAGNOSIS — D489 Neoplasm of uncertain behavior, unspecified: Secondary | ICD-10-CM

## 2023-03-30 DIAGNOSIS — Z872 Personal history of diseases of the skin and subcutaneous tissue: Secondary | ICD-10-CM

## 2023-03-30 DIAGNOSIS — Z85828 Personal history of other malignant neoplasm of skin: Secondary | ICD-10-CM | POA: Diagnosis not present

## 2023-03-30 DIAGNOSIS — L578 Other skin changes due to chronic exposure to nonionizing radiation: Secondary | ICD-10-CM

## 2023-03-30 DIAGNOSIS — D229 Melanocytic nevi, unspecified: Secondary | ICD-10-CM

## 2023-03-30 DIAGNOSIS — D239 Other benign neoplasm of skin, unspecified: Secondary | ICD-10-CM

## 2023-03-30 NOTE — Patient Instructions (Addendum)
Biopsy Wound Care Instructions  Leave the original bandage on for 24 hours if possible.  If the bandage becomes soaked or soiled before that time, it is OK to remove it and examine the wound.  A small amount of post-operative bleeding is normal.  If excessive bleeding occurs, remove the bandage, place gauze over the site and apply continuous pressure (no peeking) over the area for 30 minutes. If this does not work, please call our clinic as soon as possible or page your doctor if it is after hours.   Once a day, cleanse the wound with soap and water. It is fine to shower. If a thick crust develops you may use a Q-tip dipped into dilute hydrogen peroxide (mix 1:1 with water) to dissolve it.  Hydrogen peroxide can slow the healing process, so use it only as needed.    After washing, apply petroleum jelly (Vaseline) or an antibiotic ointment if your doctor prescribed one for you, followed by a bandage.    For best healing, the wound should be covered with a layer of ointment at all times. If you are not able to keep the area covered with a bandage to hold the ointment in place, this may mean re-applying the ointment several times a day.  Continue this wound care until the wound has healed and is no longer open.   Itching and mild discomfort is normal during the healing process. However, if you develop pain or severe itching, please call our office.   If you have any discomfort, you can take Tylenol (acetaminophen) or ibuprofen as directed on the bottle. (Please do not take these if you have an allergy to them or cannot take them for another reason).  Some redness, tenderness and white or yellow material in the wound is normal healing.  If the area becomes very sore and red, or develops a thick yellow-green material (pus), it may be infected; please notify us.    If you have stitches, return to clinic as directed to have the stitches removed. You will continue wound care for 2-3 days after the stitches  are removed.   Wound healing continues for up to one year following surgery. It is not unusual to experience pain in the scar from time to time during the interval.  If the pain becomes severe or the scar thickens, you should notify the office.    A slight amount of redness in a scar is expected for the first six months.  After six months, the redness will fade and the scar will soften and fade.  The color difference becomes less noticeable with time.  If there are any problems, return for a post-op surgery check at your earliest convenience.  To improve the appearance of the scar, you can use silicone scar gel, cream, or sheets (such as Mederma or Serica) every night for up to one year. These are available over the counter (without a prescription).  Please call our office at [phone] for any questions or concerns.       Melanoma ABCDEs  Melanoma is the most dangerous type of skin cancer, and is the leading cause of death from skin disease.  You are more likely to develop melanoma if you: Have light-colored skin, light-colored eyes, or red or blond hair Spend a lot of time in the sun Tan regularly, either outdoors or in a tanning bed Have had blistering sunburns, especially during childhood Have a close family member who has had a melanoma Have atypical moles   or large birthmarks  Early detection of melanoma is key since treatment is typically straightforward and cure rates are extremely high if we catch it early.   The first sign of melanoma is often a change in a mole or a new dark spot.  The ABCDE system is a way of remembering the signs of melanoma.  A for asymmetry:  The two halves do not match. B for border:  The edges of the growth are irregular. C for color:  A mixture of colors are present instead of an even brown color. D for diameter:  Melanomas are usually (but not always) greater than 6mm - the size of a pencil eraser. E for evolution:  The spot keeps changing in  size, shape, and color.  Please check your skin once per month between visits. You can use a small mirror in front and a large mirror behind you to keep an eye on the back side or your body.   If you see any new or changing lesions before your next follow-up, please call to schedule a visit.  Please continue daily skin protection including broad spectrum sunscreen SPF 30+ to sun-exposed areas, reapplying every 2 hours as needed when you're outdoors.   Staying in the shade or wearing long sleeves, sun glasses (UVA+UVB protection) and wide brim hats (4-inch brim around the entire circumference of the hat) are also recommended for sun protection.      Due to recent changes in healthcare laws, you may see results of your pathology and/or laboratory studies on MyChart before the doctors have had a chance to review them. We understand that in some cases there may be results that are confusing or concerning to you. Please understand that not all results are received at the same time and often the doctors may need to interpret multiple results in order to provide you with the best plan of care or course of treatment. Therefore, we ask that you please give us 2 business days to thoroughly review all your results before contacting the office for clarification. Should we see a critical lab result, you will be contacted sooner.   If You Need Anything After Your Visit  If you have any questions or concerns for your doctor, please call our main line at [phone] and press option 4 to reach your doctor's medical assistant. If no one answers, please leave a voicemail as directed and we will return your call as soon as possible. Messages left after 4 pm will be answered the following business day.   You may also send us a message via MyChart. We typically respond to MyChart messages within 1-2 business days.  For prescription refills, please ask your pharmacy to contact our office. Our fax number is  [phone].  If you have an urgent issue when the clinic is closed that cannot wait until the next business day, you can page your doctor at the number below.    Please note that while we do our best to be available for urgent issues outside of office hours, we are not available 24/7.   If you have an urgent issue and are unable to reach us, you may choose to seek medical care at your doctor's office, retail clinic, urgent care center, or emergency room.  If you have a medical emergency, please immediately call 911 or go to the emergency department.  Pager Numbers  - Dr. Kowalski: [phone]  - Dr. Moye: [phone]  - Dr. Stewart: [phone]  In the event   of inclement weather, please call our main line at [phone] for an update on the status of any delays or closures.  Dermatology Medication Tips: Please keep the boxes that topical medications come in in order to help keep track of the instructions about where and how to use these. Pharmacies typically print the medication instructions only on the boxes and not directly on the medication tubes.   If your medication is too expensive, please contact our office at [phone] option 4 or send us a message through MyChart.   We are unable to tell what your co-pay for medications will be in advance as this is different depending on your insurance coverage. However, we may be able to find a substitute medication at lower cost or fill out paperwork to get insurance to cover a needed medication.   If a prior authorization is required to get your medication covered by your insurance company, please allow us 1-2 business days to complete this process.  Drug prices often vary depending on where the prescription is filled and some pharmacies may offer cheaper prices.  The website www.goodrx.com contains coupons for medications through different pharmacies. The prices here do not account for what the cost may be with help from  insurance (it may be cheaper with your insurance), but the website can give you the price if you did not use any insurance.  - You can print the associated coupon and take it with your prescription to the pharmacy.  - You may also stop by our office during regular business hours and pick up a GoodRx coupon card.  - If you need your prescription sent electronically to a different pharmacy, notify our office through [facility] MyChart or by phone at [phone] option 4.     Si Usted Necesita Algo Despus de Su Visita  Tambin puede enviarnos un mensaje a travs de MyChart. Por lo general respondemos a los mensajes de MyChart en el transcurso de 1 a 2 das hbiles.  Para renovar recetas, por favor pida a su farmacia que se ponga en contacto con nuestra oficina. Nuestro nmero de fax es el [phone].  Si tiene un asunto urgente cuando la clnica est cerrada y que no puede esperar hasta el siguiente da hbil, puede llamar/localizar a su doctor(a) al nmero que aparece a continuacin.   Por favor, tenga en cuenta que aunque hacemos todo lo posible para estar disponibles para asuntos urgentes fuera del horario de oficina, no estamos disponibles las 24 horas del da, los 7 das de la semana.   Si tiene un problema urgente y no puede comunicarse con nosotros, puede optar por buscar atencin mdica  en el consultorio de su doctor(a), en una clnica privada, en un centro de atencin urgente o en una sala de emergencias.  Si tiene una emergencia mdica, por favor llame inmediatamente al 911 o vaya a la sala de emergencias.  Nmeros de bper  - Dr. Kowalski: [phone]  - Dra. Moye: [phone]  - Dra. Stewart: [phone]  En caso de inclemencias del tiempo, por favor llame a nuestra lnea principal al [phone] para una actualizacin sobre el estado de cualquier retraso o cierre.  Consejos para la medicacin en dermatologa: Por favor, guarde las cajas en las que vienen los  medicamentos de uso tpico para ayudarle a seguir las instrucciones sobre dnde y cmo usarlos. Las farmacias generalmente imprimen las instrucciones del medicamento slo en las cajas y no directamente en los tubos del medicamento.   Si su medicamento   es muy caro, por favor, pngase en contacto con nuestra oficina llamando al [phone] y presione la opcin 4 o envenos un mensaje a travs de MyChart.   No podemos decirle cul ser su copago por los medicamentos por adelantado ya que esto es diferente dependiendo de la cobertura de su seguro. Sin embargo, es posible que podamos encontrar un medicamento sustituto a menor costo o llenar un formulario para que el seguro cubra el medicamento que se considera necesario.   Si se requiere una autorizacin previa para que su compaa de seguros cubra su medicamento, por favor permtanos de 1 a 2 das hbiles para completar este proceso.  Los precios de los medicamentos varan con frecuencia dependiendo del lugar de dnde se surte la receta y alguna farmacias pueden ofrecer precios ms baratos.  El sitio web www.goodrx.com tiene cupones para medicamentos de diferentes farmacias. Los precios aqu no tienen en cuenta lo que podra costar con la ayuda del seguro (puede ser ms barato con su seguro), pero el sitio web puede darle el precio si no utiliz ningn seguro.  - Puede imprimir el cupn correspondiente y llevarlo con su receta a la farmacia.  - Tambin puede pasar por nuestra oficina durante el horario de atencin regular y recoger una tarjeta de cupones de GoodRx.  - Si necesita que su receta se enve electrnicamente a una farmacia diferente, informe a nuestra oficina a travs de MyChart de [facility] o por telfono llamando al [phone] y presione la opcin 4.  

## 2023-03-30 NOTE — Progress Notes (Signed)
Follow-Up Visit   Subjective  Holly Bennett is a 57 y.o. female who presents for the following: Skin Cancer Screening and Full Body Skin Exam Hx of bcc, hx of isks, hx of aks, noticed a spot at left lower leg x 2 mos.  Bleeds with shaving.   The patient presents for Total-Body Skin Exam (TBSE) for skin cancer screening and mole check. The patient has spots, moles and lesions to be evaluated, some may be new or changing and the patient has concerns that these could be cancer.    The following portions of the chart were reviewed this encounter and updated as appropriate: medications, allergies, medical history  Review of Systems:  No other skin or systemic complaints except as noted in HPI or Assessment and Plan.  Objective  Well appearing patient in no apparent distress; mood and affect are within normal limits.  A full examination was performed including scalp, head, eyes, ears, nose, lips, neck, chest, axillae, abdomen, back, buttocks, bilateral upper extremities, bilateral lower extremities, hands, feet, fingers, toes, fingernails, and toenails. All findings within normal limits unless otherwise noted below.   Relevant physical exam findings are noted in the Assessment and Plan.  left medial lower leg 3 mm pink red crusted papule          Assessment & Plan   LENTIGINES, SEBORRHEIC KERATOSES, HEMANGIOMAS - Benign normal skin lesions - Benign-appearing - Call for any changes SK- Left frontal hairline   MELANOCYTIC NEVI - Tan-brown and/or pink-flesh-colored symmetric macules and papules - Benign appearing on exam today - Observation - Call clinic for new or changing moles - Recommend daily use of broad spectrum spf 30+ sunscreen to sun-exposed areas.  Nevus Right Lateral Breast   6mm medium dark brown macule, present for years without change   Benign-appearing. Stable compared to previous visit. Observation.  Call clinic for new or changing moles.  Recommend daily  use of broad spectrum spf 30+ sunscreen to sun-exposed areas.        DERMATOFIBROMA  Exam: right thigh- Firm pink/brown papulenodule with dimple sign , right posterior ankle-  3 mm light tan firm papule   Treatment Plan: A dermatofibroma is a benign growth possibly related to trauma, such as an insect bite, cut from shaving, or inflamed acne-type bump.  Treatment options to remove include shave or excision with resulting scar and risk of recurrence.  Since benign-appearing and not bothersome, will observe for now.   ACTINIC DAMAGE - Chronic condition, secondary to cumulative UV/sun exposure - diffuse scaly erythematous macules with underlying dyspigmentation - Recommend daily broad spectrum sunscreen SPF 30+ to sun-exposed areas, reapply every 2 hours as needed.  - Staying in the shade or wearing long sleeves, sun glasses (UVA+UVB protection) and wide brim hats (4-inch brim around the entire circumference of the hat) are also recommended for sun protection.  - Call for new or changing lesions.  HISTORY OF BASAL CELL CARCINOMA OF THE SKIN - No evidence of recurrence today - Recommend regular full body skin exams - Recommend daily broad spectrum sunscreen SPF 30+ to sun-exposed areas, reapply every 2 hours as needed.  - Call if any new or changing lesions are noted between office visits  SKIN CANCER SCREENING PERFORMED TODAY.  Neoplasm of uncertain behavior left medial lower leg  Epidermal / dermal shaving  Lesion diameter (cm):  0.3 Informed consent: discussed and consent obtained   Patient was prepped and draped in usual sterile fashion: Area prepped with alcohol. Anesthesia:  the lesion was anesthetized in a standard fashion   Anesthetic:  1% lidocaine w/ epinephrine 1-100,000 buffered w/ 8.4% NaHCO3 Instrument used: flexible razor blade   Hemostasis achieved with: pressure, aluminum chloride and electrodesiccation   Outcome: patient tolerated procedure well   Post-procedure  details: wound care instructions given   Post-procedure details comment:  Ointment and small bandage applied.   Specimen 1 - Surgical pathology Differential Diagnosis: R/o  irritated hemangioma vs other  Check Margins: No  R/o isk vs irritated hemangioma vs folliculitis       Return in about 1 year (around 03/29/2024) for TBSE.  I, Asher Muir, CMA, am acting as scribe for Willeen Niece, MD.   Documentation: I have reviewed the above documentation for accuracy and completeness, and I agree with the above.  Willeen Niece, MD

## 2023-04-05 ENCOUNTER — Telehealth: Payer: Self-pay

## 2023-04-05 NOTE — Telephone Encounter (Signed)
-----   Message from Willeen Niece, MD sent at 04/05/2023 12:53 PM EDT ----- Skin , left medial lower leg HEMANGIOMA, IRRITATED  Benign - please call patient

## 2023-04-05 NOTE — Telephone Encounter (Signed)
Advised pt of bx result/sh ?

## 2023-07-20 ENCOUNTER — Telehealth: Payer: Self-pay

## 2023-07-20 ENCOUNTER — Encounter: Payer: Self-pay | Admitting: Obstetrics & Gynecology

## 2023-07-20 DIAGNOSIS — Z1231 Encounter for screening mammogram for malignant neoplasm of breast: Secondary | ICD-10-CM

## 2023-07-20 NOTE — Telephone Encounter (Signed)
Pt LVM in triage line stating that she needs order for her mammo sent to [facility].

## 2023-07-21 NOTE — Telephone Encounter (Signed)
LDVM on machine per DPR w/ instructions for pt just to call and schedule if it is only for a screening mammo and provided pt with Dr. Layla Maw full name so that imaging center can send order by fax so that Dr. Karma Greaser can sign but she should not need a screening mammo order to make an appt.   However, if she is having any issues with her breasts, then they will need an order for a diagnostic mammogram and then she will need an appt. Advised her to cb if that was the case or if she needed further assistance.  Routing to provider for final review and closing encounter.

## 2023-07-27 ENCOUNTER — Other Ambulatory Visit: Payer: Self-pay | Admitting: Obstetrics and Gynecology

## 2023-07-27 DIAGNOSIS — Z1231 Encounter for screening mammogram for malignant neoplasm of breast: Secondary | ICD-10-CM

## 2023-08-02 ENCOUNTER — Ambulatory Visit: Payer: BC Managed Care – PPO | Admitting: Dermatology

## 2023-08-02 ENCOUNTER — Encounter: Payer: Self-pay | Admitting: Dermatology

## 2023-08-02 VITALS — BP 113/81 | HR 72

## 2023-08-02 DIAGNOSIS — L988 Other specified disorders of the skin and subcutaneous tissue: Secondary | ICD-10-CM

## 2023-08-02 DIAGNOSIS — L820 Inflamed seborrheic keratosis: Secondary | ICD-10-CM

## 2023-08-02 NOTE — Progress Notes (Signed)
   Follow-Up Visit   Subjective  Holly Bennett is a 57 y.o. female who presents for the following: Botox for facial elastosis Patient would also like to have a spot checked at left cheek area she noticed a month ago that is bothersome.   The following portions of the chart were reviewed this encounter and updated as appropriate: medications, allergies, medical history  Review of Systems:  No other skin or systemic complaints except as noted in HPI or Assessment and Plan.  Objective  Well appearing patient in no apparent distress; mood and affect are within normal limits.  A focused examination was performed of the face and left cheek   Relevant physical exam findings are noted in the Assessment and Plan.   Injection map photo      left lateral cheek x 1 4 mm light pink/flesh scaly papule     Assessment & Plan   Inflamed seborrheic keratosis left lateral cheek x 1  Symptomatic, irritating, patient would like treated.  Destruction of lesion - left lateral cheek x 1  Destruction method: cryotherapy   Informed consent: discussed and consent obtained   Lesion destroyed using liquid nitrogen: Yes   Region frozen until ice ball extended beyond lesion: Yes   Outcome: patient tolerated procedure well with no complications   Post-procedure details: wound care instructions given   Additional details:  Prior to procedure, discussed risks of blister formation, small wound, skin dyspigmentation, or rare scar following cryotherapy. Recommend Vaseline ointment to treated areas while healing.    Facial Elastosis  Location: See attached image  Informed consent: Discussed risks (infection, pain, bleeding, bruising, swelling, allergic reaction, paralysis of nearby muscles, eyelid droop, double vision, neck weakness, difficulty breathing, headache, undesirable cosmetic result, and need for additional treatment) and benefits of the procedure, as well as the alternatives.  Informed  consent was obtained.  Preparation: The area was cleansed with alcohol.  Procedure Details:  Botox was injected into the dermis with a 30-gauge needle. Pressure applied to any bleeding. Ice packs offered for swelling.  Lot Number:  [id number] Expiration:  06/2025  Total Units Injected:  20  Plan: Tylenol may be used for headache.  Allow 2 weeks before returning to clinic for additional dosing as needed. Patient will call for any problems.    Return for 3 - 4 month botox .  I, Asher Muir, CMA, am acting as scribe for Willeen Niece, MD.   Documentation: I have reviewed the above documentation for accuracy and completeness, and I agree with the above.  Willeen Niece, MD

## 2023-08-02 NOTE — Patient Instructions (Addendum)

## 2023-10-07 ENCOUNTER — Ambulatory Visit: Payer: BC Managed Care – PPO | Admitting: Obstetrics and Gynecology

## 2023-10-07 ENCOUNTER — Other Ambulatory Visit (HOSPITAL_COMMUNITY)
Admission: RE | Admit: 2023-10-07 | Discharge: 2023-10-07 | Disposition: A | Discharge status: Home / Self Care | Payer: BC Managed Care – PPO | Source: Ambulatory Visit | Attending: Obstetrics and Gynecology | Admitting: Obstetrics and Gynecology

## 2023-10-07 ENCOUNTER — Encounter: Payer: Self-pay | Admitting: Obstetrics and Gynecology

## 2023-10-07 VITALS — BP 116/70 | HR 76 | Ht 64.5 in | Wt 167.0 lb

## 2023-10-07 DIAGNOSIS — Z1211 Encounter for screening for malignant neoplasm of colon: Secondary | ICD-10-CM

## 2023-10-07 DIAGNOSIS — E2839 Other primary ovarian failure: Secondary | ICD-10-CM

## 2023-10-07 DIAGNOSIS — E785 Hyperlipidemia, unspecified: Secondary | ICD-10-CM | POA: Diagnosis not present

## 2023-10-07 DIAGNOSIS — Z01419 Encounter for gynecological examination (general) (routine) without abnormal findings: Secondary | ICD-10-CM | POA: Diagnosis not present

## 2023-10-07 DIAGNOSIS — C50912 Malignant neoplasm of unspecified site of left female breast: Secondary | ICD-10-CM | POA: Diagnosis not present

## 2023-10-07 DIAGNOSIS — E559 Vitamin D deficiency, unspecified: Secondary | ICD-10-CM | POA: Diagnosis not present

## 2023-10-07 NOTE — Progress Notes (Signed)
57 y.o. y.o. female here for annual exam. Patient's last menstrual period was 03/31/2018.   [id number] Married.  Girls 57 yo and 57 yo (Getting married)   RP:  Established patient presenting for annual gyn exam    HPI:  Left breast Ca post Lumpectomy/Sentinel LN Bx on 11/17/2017 and Radiation Therapy. Had wound infection at scar.  Stopped Tamoxifen x 03/2021.  No PMB x 02/2022 when a Sonohysto showed a combined endometrial thickness at 2.8 mm with no IU lesions except for a partially SM Fibroid.  No pelvic pain. No pain with IC.  Pap Atypia with atrophy/HPV HR Neg in 09/2021.  Pap reflex today. Breasts normal.  Mammo Neg 09/2022, scheduled for this December.  Urine/BMs normal. BMI 28.1.  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  Will f/u here for Fasting Health labs.  Colono 10/2018 repeat at 5 years. Dxa: ordered has family history of osteoporosis Body mass index is 28.22 kg/m.  [place] Sonohysterogram (Accession [id number]) (Order [id number]) Imaging Date: 03/05/2022 Department: [facility] Released By: Talbert Forest Authorizing: Genia Del, MD   Exam Status  Status  Final [99]   PACS Intelerad Image Link   Show images for [place] Sonohysterogram Study Result  Narrative & Impression     Sono Infusion Hysterogram ( procedure note)     The initial transvaginal ultrasound demonstrated the following:   T/V images.  Comparison is made with previous scan January 13, 2022.  Retroverted uterus normal in size and shape with 2 small fibroids adjacent to the endometrial cavity.  Thin endometrium measured at 2.9 mm slightly irregular in area of fibroids.  The uterus is measured at 8.49 x 4.94 x 4.08.  Both ovaries are small with atrophic appearance.  The collapsed cyst seen March 21 on the left ovary has resolved.  Small amount of free fluid.         10/07/2023    3:03 PM 10/09/2020    3:07 PM 08/30/2020   10:59 AM  Depression screen PHQ 2/9  Decreased Interest  0 0 0  Down, Depressed, Hopeless 0 0 0  PHQ - 2 Score 0 0 0    Blood pressure 116/70, pulse 76, height 5' 4.5" (1.638 m), weight 167 lb (75.8 kg), last menstrual period 03/31/2018, SpO2 98%.     Component Value Date/Time   DIAGPAP  10/05/2022 1643    - Negative for intraepithelial lesion or malignancy (NILM)   DIAGPAP - Atrophic pattern with epithelial atypia (A) 09/30/2021 1516   HPVHIGH Negative 09/30/2021 1516   ADEQPAP  10/05/2022 1643    Satisfactory for evaluation; transformation zone component PRESENT.   ADEQPAP  09/30/2021 1516    Satisfactory for evaluation; transformation zone component PRESENT.    GYN HISTORY:    Component Value Date/Time   DIAGPAP  10/05/2022 1643    - Negative for intraepithelial lesion or malignancy (NILM)   DIAGPAP - Atrophic pattern with epithelial atypia (A) 09/30/2021 1516   HPVHIGH Negative 09/30/2021 1516   ADEQPAP  10/05/2022 1643    Satisfactory for evaluation; transformation zone component PRESENT.   ADEQPAP  09/30/2021 1516    Satisfactory for evaluation; transformation zone component PRESENT.    OB History  Gravida Para Term Preterm AB Living  4 2   2 2   SAB IAB Ectopic Multiple Live Births  2        # Outcome Date GA Lbr Len/2nd Weight Sex Type Anes PTL Lv  4 SAB  3 SAB           2 Para           1 Para             Past Medical History:  Diagnosis Date   Acute right flank pain 01/21/2011   Back pain 01/21/2011   Basal cell carcinoma 02/08/2017   left cheek inferior zygoma. Mohs with Dr. Adriana Simas.   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 mention of complication    genital   History of kidney stones    passed twice   Personal history of other malignant  neoplasm of skin 06/10/2017   Personal history of radiation therapy    LEFFT lumpectomy   PONV (postoperative nausea and vomiting)    Viral warts, unspecified     Past Surgical History:  Procedure Laterality Date   APPENDECTOMY  1983   AXILLARY SENTINEL NODE BIOPSY Left 11/17/2017   Procedure: AXILLARY SENTINEL NODE BIOPSY;  Surgeon: Henrene Dodge, MD;  Location: [facility] ORS;  Service: General;  Laterality: Left;   BREAST BIOPSY Left 10/15/2017   INVASIVE MAMMARY CARCINOMA   BREAST LUMPECTOMY Left 11/17/2017   INVASIVE MAMMARY CARCINOMA with radiation   BREAST LUMPECTOMY     BREAST LUMPECTOMY WITH NEEDLE LOCALIZATION Left 11/17/2017   Procedure: BREAST LUMPECTOMY WITH NEEDLE LOCALIZATION;  Surgeon: Henrene Dodge, MD;  Location: [facility] ORS;  Service: General;  Laterality: Left;   COLONOSCOPY WITH PROPOFOL N/A 11/04/2018   Procedure: COLONOSCOPY WITH PROPOFOL;  Surgeon: Wyline Mood, MD;  Location: [facility] ENDOSCOPY;  Service: Gastroenterology;  Laterality: N/A;   DILATION AND CURETTAGE OF UTERUS     x4   MOHS SURGERY  2018   TONSILLECTOMY AND ADENOIDECTOMY     WISDOM TOOTH EXTRACTION      Current Outpatient Medications on File Prior to Visit  Medication Sig Dispense Refill   Ascorbic Acid (VITAMIN C) 500 MG tablet Take 500 mg by mouth daily.     Collagen Hydrolysate POWD by Does not apply route.     glucosamine-chondroitin 500-400 MG tablet Take 1 tablet by mouth in the morning and at bedtime.     lactobacillus acidophilus (BACID) TABS tablet Take 2 tablets by mouth 3 (three) times daily.     Multiple Vitamin (MULTIVITAMIN) tablet Take 1 tablet by mouth daily.     vitamin B-12 (CYANOCOBALAMIN) 100 MCG tablet Take 100 mcg by mouth daily.     VITAMIN D PO Take by mouth.     No current facility-administered medications on file prior to visit.    Social History   Socioeconomic History   Marital status: Married    Spouse name: Not on file   Number of children: 2   Years of education:  Not on file   Highest education level: Not on file  Occupational History   Occupation: Psychologist, educational at [employer]  Tobacco Use   Smoking status: Never   Smokeless tobacco: Never  Vaping Use   Vaping status: Never Used  Substance and Sexual Activity   Alcohol use: Yes    Comment: Socially on weekends   Drug use: No   Sexual activity: Yes    Partners: Male  Birth control/protection: Post-menopausal    Comment: 1st intercourse- 18, partners- more than 5  Other Topics Concern   Not on file  Social History Narrative   Not on file   Social Drivers of Health   Financial Resource Strain: Not on file  Food Insecurity: Not on file  Transportation Needs: Not on file  Physical Activity: Not on file  Stress: Not on file  Social Connections: Not on file  Intimate Partner Violence: Not on file    Family History  Problem Relation Age of Onset   Breast cancer Mother 22       currently 63   Hypertension Father    Lung cancer Father 83       smoker; deceased 81   Alcohol abuse Father    Asthma Maternal Grandmother    Arthritis Maternal Grandmother    Heart disease Maternal Grandmother    Asthma Paternal Grandmother    Breast cancer Other        mat grandmother's sister   Depression Daughter      Allergies  Allergen Reactions   Blue Dyes (Parenteral) Dermatitis   Penicillins Rash and Other (See Comments)    Has patient had a PCN reaction causing immediate rash, facial/tongue/throat swelling, SOB or lightheadedness with hypotension: Unknown Has patient had a PCN reaction causing severe rash involving mucus membranes or skin necrosis: No Has patient had a PCN reaction that required hospitalization: No Has patient had a PCN reaction occurring within the last 10 years: No If all of the above answers are "NO", then may proceed with Cephalosporin use.       Patient's last menstrual period was Patient's last menstrual period was 03/31/2018.Marland Kitchen           Review of Systems Alls  systems reviewed and are negative.     Physical Exam Constitutional:      Appearance: Normal appearance.  Genitourinary:     Vulva and urethral meatus normal.     No lesions in the vagina.     Right Labia: No rash, lesions or skin changes.    Left Labia: No lesions, skin changes or rash.    No vaginal discharge or tenderness.     No vaginal prolapse present.    No vaginal atrophy present.     Right Adnexa: not tender, not palpable and no mass present.    Left Adnexa: not tender, not palpable and no mass present.    No cervical motion tenderness or discharge.     Uterus is not enlarged, tender or irregular.  Breasts:    Right: Normal.     Left: Normal.  HENT:     Head: Normocephalic.  Neck:     Thyroid: No thyroid mass, thyromegaly or thyroid tenderness.  Cardiovascular:     Rate and Rhythm: Normal rate and regular rhythm.     Heart sounds: Normal heart sounds, S1 normal and S2 normal.  Pulmonary:     Effort: Pulmonary effort is normal.     Breath sounds: Normal breath sounds and air entry.  Abdominal:     General: There is no distension.     Palpations: Abdomen is soft. There is no mass.     Tenderness: There is no abdominal tenderness. There is no guarding or rebound.  Musculoskeletal:        General: Normal range of motion.     Cervical back: Full passive range of motion without pain, normal range of motion and neck supple. No tenderness.  Right lower leg: No edema.     Left lower leg: No edema.  Neurological:     Mental Status: She is alert.  Skin:    General: Skin is warm.  Psychiatric:        Mood and Affect: Mood normal.        Behavior: Behavior normal.        Thought Content: Thought content normal.  Vitals and nursing note reviewed. Exam conducted with a chaperone present.       A:         Well Woman GYN exam                             P:        Pap smear collected today Encouraged annual mammogram screening Colon cancer screening referral  placed today DXA ordered today Labs and immunizations ordered today Encouraged healthy lifestyle practices Encouraged Vit D and Calcium   No follow-ups on file.  Earley Favor

## 2023-10-08 LAB — CBC
HCT: 38.1 % (ref 35.0–45.0)
Hemoglobin: 12.4 g/dL (ref 11.7–15.5)
MCH: 28.3 pg (ref 27.0–33.0)
MCHC: 32.5 g/dL (ref 32.0–36.0)
MCV: 87 fL (ref 80.0–100.0)
MPV: 10.6 fL (ref 7.5–12.5)
Platelets: 301 10*3/uL (ref 140–400)
RBC: 4.38 10*6/uL (ref 3.80–5.10)
RDW: 12.5 % (ref 11.0–15.0)
WBC: 6.4 10*3/uL (ref 3.8–10.8)

## 2023-10-08 LAB — TSH: TSH: 2.3 m[IU]/L (ref 0.40–4.50)

## 2023-10-08 LAB — COMPREHENSIVE METABOLIC PANEL
AG Ratio: 1.7 (calc) (ref 1.0–2.5)
ALT: 13 U/L (ref 6–29)
AST: 11 U/L (ref 10–35)
Albumin: 4.4 g/dL (ref 3.6–5.1)
Alkaline phosphatase (APISO): 117 U/L (ref 37–153)
BUN/Creatinine Ratio: 38 (calc) — ABNORMAL HIGH (ref 6–22)
BUN: 28 mg/dL — ABNORMAL HIGH (ref 7–25)
CO2: 25 mmol/L (ref 20–32)
Calcium: 9.5 mg/dL (ref 8.6–10.4)
Chloride: 105 mmol/L (ref 98–110)
Creat: 0.74 mg/dL (ref 0.50–1.03)
Globulin: 2.6 g/dL (ref 1.9–3.7)
Glucose, Bld: 77 mg/dL (ref 65–99)
Potassium: 4.3 mmol/L (ref 3.5–5.3)
Sodium: 140 mmol/L (ref 135–146)
Total Bilirubin: 0.2 mg/dL (ref 0.2–1.2)
Total Protein: 7 g/dL (ref 6.1–8.1)

## 2023-10-08 LAB — LIPID PANEL
Cholesterol: 234 mg/dL — ABNORMAL HIGH (ref <200)
HDL: 75 mg/dL (ref >=50)
LDL Cholesterol (Calc): 136 mg/dL — ABNORMAL HIGH
Non-HDL Cholesterol (Calc): 159 mg/dL — ABNORMAL HIGH (ref <130)
Total CHOL/HDL Ratio: 3.1 (calc) (ref <5.0)
Triglycerides: 122 mg/dL (ref <150)

## 2023-10-08 LAB — HEMOGLOBIN A1C
Hgb A1c MFr Bld: 5.4 %{Hb} (ref <5.7)
Mean Plasma Glucose: 108 mg/dL
eAG (mmol/L): 6 mmol/L

## 2023-10-08 LAB — VITAMIN D 25 HYDROXY (VIT D DEFICIENCY, FRACTURES): Vit D, 25-Hydroxy: 34 ng/mL (ref 30–100)

## 2023-10-12 LAB — CYTOLOGY - PAP
Comment: NEGATIVE
Diagnosis: NEGATIVE
High risk HPV: NEGATIVE

## 2023-10-18 ENCOUNTER — Ambulatory Visit
Admission: RE | Admit: 2023-10-18 | Discharge: 2023-10-18 | Disposition: A | Discharge status: Home / Self Care | Payer: BC Managed Care – PPO | Source: Ambulatory Visit | Attending: Obstetrics and Gynecology | Admitting: Obstetrics and Gynecology

## 2023-10-18 DIAGNOSIS — Z1231 Encounter for screening mammogram for malignant neoplasm of breast: Secondary | ICD-10-CM

## 2023-11-30 ENCOUNTER — Telehealth: Payer: Self-pay

## 2023-11-30 DIAGNOSIS — E2839 Other primary ovarian failure: Secondary | ICD-10-CM

## 2023-11-30 NOTE — Telephone Encounter (Signed)
Pt LVM in triage line stating that she was recommended to have a DXA scan by [facility] but is not aware of location to call and schedule.

## 2023-11-30 NOTE — Telephone Encounter (Signed)
 Spoke with patient. Reviewed BMD locations, new order placed for [facility]. Number provided for patient to call and schedule.   Previous order cancelled.   Routing to provider for final review. Patient is agreeable to disposition. Will close encounter.

## 2023-12-08 ENCOUNTER — Encounter: Payer: Self-pay | Admitting: Obstetrics and Gynecology

## 2023-12-08 ENCOUNTER — Ambulatory Visit
Admission: RE | Admit: 2023-12-08 | Discharge: 2023-12-08 | Disposition: A | Discharge status: Home / Self Care | Payer: BC Managed Care – PPO | Source: Ambulatory Visit | Attending: Obstetrics and Gynecology | Admitting: Obstetrics and Gynecology

## 2023-12-08 DIAGNOSIS — E2839 Other primary ovarian failure: Secondary | ICD-10-CM | POA: Diagnosis not present

## 2023-12-08 DIAGNOSIS — C50919 Malignant neoplasm of unspecified site of unspecified female breast: Secondary | ICD-10-CM | POA: Diagnosis not present

## 2023-12-08 DIAGNOSIS — Z853 Personal history of malignant neoplasm of breast: Secondary | ICD-10-CM | POA: Diagnosis not present

## 2024-02-07 ENCOUNTER — Ambulatory Visit (INDEPENDENT_AMBULATORY_CARE_PROVIDER_SITE_OTHER): Payer: BC Managed Care – PPO | Admitting: Dermatology

## 2024-02-07 DIAGNOSIS — L988 Other specified disorders of the skin and subcutaneous tissue: Secondary | ICD-10-CM

## 2024-02-07 NOTE — Progress Notes (Signed)
   Follow-Up Visit   Subjective  Holly Bennett is a 58 y.o. female who presents for the following: Botox for facial elastosis  The following portions of the chart were reviewed this encounter and updated as appropriate: medications, allergies, medical history  Review of Systems:  No other skin or systemic complaints except as noted in HPI or Assessment and Plan.  Objective  Well appearing patient in no apparent distress; mood and affect are within normal limits.  A focused examination was performed of the face.  Relevant physical exam findings are noted in the Assessment and Plan.  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  Location: frown complex, forehead  Informed consent: Discussed risks (infection, pain, bleeding, bruising, swelling, allergic reaction, paralysis of nearby muscles, eyelid droop, double vision, neck weakness, difficulty breathing, headache, undesirable cosmetic result, and need for additional treatment) and benefits of the procedure, as well as the alternatives.  Informed consent was obtained.  Preparation: The area was cleansed with alcohol.  Procedure Details:  Botox was injected into the dermis with a 30-gauge needle. Pressure applied to any bleeding. Ice packs offered for swelling.  Lot Number:  [id number] Expiration:  11/2025  Total Units Injected:  25  Plan: Tylenol may be used for headache.  Allow 2 weeks before returning to clinic for additional dosing as needed. Patient will call for any problems.  Return for 3-65m Botox.  I, Rollie Clipper, RMA, am acting as scribe for Artemio Larry, MD .   Documentation: I have reviewed the above documentation for accuracy and completeness, and I agree with the above.  Artemio Larry,  MD

## 2024-02-07 NOTE — Patient Instructions (Signed)

## 2024-02-19 DIAGNOSIS — M25461 Effusion, right knee: Secondary | ICD-10-CM | POA: Diagnosis not present

## 2024-02-19 DIAGNOSIS — K5289 Other specified noninfective gastroenteritis and colitis: Secondary | ICD-10-CM | POA: Diagnosis not present

## 2024-02-19 DIAGNOSIS — M25462 Effusion, left knee: Secondary | ICD-10-CM | POA: Diagnosis not present

## 2024-02-21 ENCOUNTER — Emergency Department (HOSPITAL_BASED_OUTPATIENT_CLINIC_OR_DEPARTMENT_OTHER)
Admission: EM | Admit: 2024-02-21 | Discharge: 2024-02-21 | Disposition: A | Discharge status: Home / Self Care | Attending: Emergency Medicine | Admitting: Emergency Medicine

## 2024-02-21 ENCOUNTER — Encounter (HOSPITAL_BASED_OUTPATIENT_CLINIC_OR_DEPARTMENT_OTHER): Payer: Self-pay

## 2024-02-21 ENCOUNTER — Other Ambulatory Visit: Payer: Self-pay

## 2024-02-21 DIAGNOSIS — M130 Polyarthritis, unspecified: Secondary | ICD-10-CM | POA: Diagnosis not present

## 2024-02-21 DIAGNOSIS — M1711 Unilateral primary osteoarthritis, right knee: Secondary | ICD-10-CM | POA: Diagnosis not present

## 2024-02-21 DIAGNOSIS — M1712 Unilateral primary osteoarthritis, left knee: Secondary | ICD-10-CM | POA: Diagnosis not present

## 2024-02-21 DIAGNOSIS — M0230 Reiter's disease, unspecified site: Secondary | ICD-10-CM | POA: Insufficient documentation

## 2024-02-21 DIAGNOSIS — M2550 Pain in unspecified joint: Secondary | ICD-10-CM | POA: Insufficient documentation

## 2024-02-21 LAB — CBC
HCT: 39.7 % (ref 36.0–46.0)
Hemoglobin: 13.4 g/dL (ref 12.0–15.0)
MCH: 29.1 pg (ref 26.0–34.0)
MCHC: 33.8 g/dL (ref 30.0–36.0)
MCV: 86.3 fL (ref 80.0–100.0)
Platelets: 231 10*3/uL (ref 150–400)
RBC: 4.6 MIL/uL (ref 3.87–5.11)
RDW: 12.8 % (ref 11.5–15.5)
WBC: 4.6 10*3/uL (ref 4.0–10.5)
nRBC: 0 % (ref 0.0–0.2)

## 2024-02-21 LAB — URINALYSIS, ROUTINE W REFLEX MICROSCOPIC
Bilirubin Urine: NEGATIVE
Glucose, UA: NEGATIVE mg/dL
Ketones, ur: NEGATIVE mg/dL
Leukocytes,Ua: NEGATIVE
Nitrite: NEGATIVE
Protein, ur: NEGATIVE mg/dL
Specific Gravity, Urine: 1.016 (ref 1.005–1.030)
pH: 6.5 (ref 5.0–8.0)

## 2024-02-21 LAB — COMPREHENSIVE METABOLIC PANEL WITH GFR
ALT: 19 U/L (ref 0–44)
AST: 17 U/L (ref 15–41)
Albumin: 4.5 g/dL (ref 3.5–5.0)
Alkaline Phosphatase: 114 U/L (ref 38–126)
Anion gap: 11 (ref 5–15)
BUN: 16 mg/dL (ref 6–20)
CO2: 26 mmol/L (ref 22–32)
Calcium: 9.6 mg/dL (ref 8.9–10.3)
Chloride: 103 mmol/L (ref 98–111)
Creatinine, Ser: 0.75 mg/dL (ref 0.44–1.00)
GFR, Estimated: >60 mL/min (ref >60)
Glucose, Bld: 99 mg/dL (ref 70–99)
Potassium: 3.8 mmol/L (ref 3.5–5.1)
Sodium: 139 mmol/L (ref 135–145)
Total Bilirubin: 0.2 mg/dL (ref 0.0–1.2)
Total Protein: 7.2 g/dL (ref 6.5–8.1)

## 2024-02-21 LAB — URINALYSIS, MICROSCOPIC (REFLEX)
Bacteria, UA: NONE SEEN
Squamous Epithelial / HPF: NONE SEEN /HPF (ref 0–5)

## 2024-02-21 MED ORDER — DEXAMETHASONE SODIUM PHOSPHATE 10 MG/ML IJ SOLN
10.0000 mg | Freq: Once | INTRAMUSCULAR | Status: AC
  Administered 2024-02-21: 10 mg via INTRAVENOUS
  Filled 2024-02-21: qty 1

## 2024-02-21 MED ORDER — LACTATED RINGERS IV BOLUS
1000.0000 mL | Freq: Once | INTRAVENOUS | Status: AC
  Administered 2024-02-21: 1000 mL via INTRAVENOUS

## 2024-02-21 MED ORDER — PREDNISONE 20 MG PO TABS: ORAL_TABLET | ORAL | 0 refills | Status: AC

## 2024-02-21 NOTE — ED Provider Notes (Signed)
 [facility] EMERGENCY DEPARTMENT AT [facility] Provider Note   CSN: [id number] Arrival date & time: 02/21/24  0757     History  Chief Complaint  Patient presents with   Joint Swelling    Holly Bennett is a 58 y.o. female.  Patient c/o low grade fever a few days ago, 100.5, and indicates then noted multiple areas joint pain/swelling, including bil knees, bil ankles, bil elbows. Indicates went to [facility] in [place], and had fluid drained from both knees, no hx same, unsure of any test results. No recent new antibiotics or new meds. No recent known focal infection. No known ill contacts. Denies headache. No neck pain/stiffness. No sore throat, cough or uri symptoms. No chest pain or sob. No abd pain or nvd. No dysuria or gu c/o. No rash/skin lesions. No focal joint redness/warmth. No back/spine area pain. No ticks/tick bite.   The history is provided by the patient and medical records.       Home Medications Prior to Admission medications   Medication Sig Start Date End Date Taking? Authorizing Provider  Ascorbic Acid (VITAMIN C) 500 MG tablet Take 500 mg by mouth daily.   Yes [provider]  Collagen Hydrolysate POWD by Does not apply route.   Yes [provider]  glucosamine-chondroitin 500-400 MG tablet Take 1 tablet by mouth in the morning and at bedtime.   Yes [provider]  Multiple Vitamin (MULTIVITAMIN) tablet Take 1 tablet by mouth daily.   Yes [provider]  vitamin B-12 (CYANOCOBALAMIN) 100 MCG tablet Take 100 mcg by mouth daily.   Yes [provider]  VITAMIN D  PO Take by mouth.   Yes [provider]  lactobacillus acidophilus (BACID) TABS tablet Take 2 tablets by mouth 3 (three) times daily.    [provider]      Allergies    Blue dyes (parenteral) and Penicillins    Review of Systems   Review of Systems  Constitutional:  Positive for fever.  HENT:  Negative for ear pain, sinus  pain and sore throat.   Eyes:  Negative for redness.  Respiratory:  Negative for cough and shortness of breath.   Cardiovascular:  Negative for chest pain.  Gastrointestinal:  Negative for abdominal pain, diarrhea and vomiting.  Genitourinary:  Negative for dysuria, flank pain and vaginal discharge.  Musculoskeletal:  Positive for arthralgias. Negative for back pain and neck pain.  Skin:  Negative for rash.  Neurological:  Negative for headaches.    Physical Exam Updated Vital Signs BP 123/83 (BP Location: Right Arm)   Pulse 84   Temp 98.6 F (37 C) (Oral)   Resp 20   Ht 1.638 m (5' 4.5")   Wt 72.6 kg   LMP 03/31/2018 Comment: no period since tamoxifen   SpO2 99%   BMI 27.04 kg/m  Physical Exam Vitals and nursing note reviewed.  Constitutional:      Appearance: Normal appearance. She is well-developed.  HENT:     Head: Atraumatic.     Right Ear: Tympanic membrane normal.     Left Ear: Tympanic membrane normal.     Nose: Nose normal.     Mouth/Throat:     Mouth: Mucous membranes are moist.     Pharynx: Oropharynx is clear.  Eyes:     General: No scleral icterus.    Conjunctiva/sclera: Conjunctivae normal.  Neck:     Trachea: No tracheal deviation.     Comments: No stiffness or rigidity.  Cardiovascular:     Rate and Rhythm: Normal rate and regular rhythm.     Pulses: Normal pulses.     Heart sounds: Normal heart sounds. No murmur heard.    No friction rub. No gallop.  Pulmonary:     Effort: Pulmonary effort is normal. No respiratory distress.     Breath sounds: Normal breath sounds.  Abdominal:     General: Bowel sounds are normal. There is no distension.     Palpations: Abdomen is soft. There is no mass.     Tenderness: There is no abdominal tenderness.  Genitourinary:    Comments: No cva tenderness.  Musculoskeletal:        General: No tenderness.     Cervical back: Normal range of motion and neck supple. No rigidity. No muscular tenderness.     Comments:  Small knee effusions. Good rom bil extremities without severe pain, specifically able to move bil elbows, knees, ankles without significant pain. No focal joint erythema or increased warmth. Intact distal pulses palp. CTLS spine, non tender, aligned, no step off.   Lymphadenopathy:     Cervical: No cervical adenopathy.  Skin:    General: Skin is warm and dry.     Findings: No rash.  Neurological:     Mental Status: She is alert.     Comments: Alert, speech normal. Motor/sens grossly intact bil.   Psychiatric:        Mood and Affect: Mood normal.     ED Results / Procedures / Treatments   Labs (all labs ordered are listed, but only abnormal results are displayed) Results for orders placed or performed during the hospital encounter of 02/21/24  Urinalysis, Routine w reflex microscopic -Urine, Clean Catch   Collection Time: 02/21/24  8:45 AM  Result Value Ref Range   Color, Urine STRAW (A) YELLOW   APPearance TURBID (A) CLEAR   Specific Gravity, Urine 1.016 1.005 - 1.030   pH 6.5 5.0 - 8.0   Glucose, UA NEGATIVE NEGATIVE mg/dL   Hgb urine dipstick LARGE (A) NEGATIVE   Bilirubin Urine NEGATIVE NEGATIVE   Ketones, ur NEGATIVE NEGATIVE mg/dL   Protein, ur NEGATIVE NEGATIVE mg/dL   Nitrite NEGATIVE NEGATIVE   Leukocytes,Ua NEGATIVE NEGATIVE  Urinalysis, Microscopic (reflex)   Collection Time: 02/21/24  8:45 AM  Result Value Ref Range   RBC / HPF 0-5 0 - 5 RBC/hpf   WBC, UA 0-5 0 - 5 WBC/hpf   Bacteria, UA NONE SEEN NONE SEEN   Squamous Epithelial / HPF NONE SEEN 0 - 5 /HPF   Hyphae Yeast PRESENT   CBC   Collection Time: 02/21/24  8:48 AM  Result Value Ref Range   WBC 4.6 4.0 - 10.5 K/uL   RBC 4.60 3.87 - 5.11 MIL/uL   Hemoglobin 13.4 12.0 - 15.0 g/dL   HCT 16.1 09.6 - 04.5 %   MCV 86.3 80.0 - 100.0 fL   MCH 29.1 26.0 - 34.0 pg   MCHC 33.8 30.0 - 36.0 g/dL   RDW 40.9 81.1 - 91.4 %   Platelets 231 150 - 400 K/uL   nRBC 0.0 0.0 - 0.2 %  Comprehensive metabolic panel with  GFR   Collection Time: 02/21/24  8:48 AM  Result Value Ref Range   Sodium 139 135 - 145 mmol/L   Potassium 3.8 3.5 - 5.1 mmol/L   Chloride 103 98 - 111 mmol/L   CO2 26 22 - 32 mmol/L   Glucose, Bld 99 70 -  99 mg/dL   BUN 16 6 - 20 mg/dL   Creatinine, Ser 1.61 0.44 - 1.00 mg/dL   Calcium 9.6 8.9 - 09.6 mg/dL   Total Protein 7.2 6.5 - 8.1 g/dL   Albumin 4.5 3.5 - 5.0 g/dL   AST 17 15 - 41 U/L   ALT 19 0 - 44 U/L   Alkaline Phosphatase 114 38 - 126 U/L   Total Bilirubin 0.2 0.0 - 1.2 mg/dL   GFR, Estimated >04 >54 mL/min   Anion gap 11 5 - 15    EKG None  Radiology No results found.  Procedures Procedures    Medications Ordered in ED Medications  lactated ringers  bolus 1,000 mL (0 mLs Intravenous Stopped 02/21/24 1027)  dexamethasone  (DECADRON ) injection 10 mg (10 mg Intravenous Given 02/21/24 0981)    ED Course/ Medical Decision Making/ A&P                                 Medical Decision Making Problems Addressed: Polyarthralgia: acute illness or injury with systemic symptoms  Amount and/or Complexity of Data Reviewed Independent Historian: spouse External Data Reviewed: notes. Labs: ordered. Decision-making details documented in ED Course.  Risk Prescription drug management.   Iv ns. Labs ordered/sent.   Differential diagnosis includes arthralgias, viral syndrome, reactive arthritis, other arthritis, other infectious illness, etc. Dispo decision including potential need for admission considered - will get labs and reassess.   Reviewed nursing notes and prior charts for additional history. External reports reviewed.   Labs reviewed/interpreted by me - wbc and hgb normal. Chem normal.  Ua w 0 wbc.   Staff to contact [facility] to see if any labs/test results available. [facility] indicates no results yet available. On exam today, no findings c/w septic arthritis.   Decadron  iv.   Rec close pcp/ortho f/u.  Return precautions provided.           Final Clinical Impression(s) / ED Diagnoses Final diagnoses:  None    Rx / DC Orders ED Discharge Orders     None         Guadalupe Lee, MD 02/21/24 1049

## 2024-02-21 NOTE — Discharge Instructions (Addendum)
 It was our pleasure to provide your ER care today - we hope that you feel better.  Take prednisone as prescribed.   Take acetaminophen  as need.  Follow up with your orthopedist for test results/recheck in the next 1-2 days.   Follow up with primary care doctor in one week if symptoms fail to improve/resolve.  Return to ER if worse, new symptoms, high fevers, new or worsening or severe pain/redness/swelling, or other concern.

## 2024-02-21 NOTE — ED Triage Notes (Signed)
 In for further eval of pain and swelling to bilateral knees with swelling to bilateral lower legs and feet. Was seen at [facility] and had fuild drained off both knees. Reports having pain in elbows as well. Endorses fever of 100.5 at home.

## 2024-04-18 ENCOUNTER — Ambulatory Visit: Payer: No Typology Code available for payment source | Admitting: Dermatology

## 2024-04-18 DIAGNOSIS — L570 Actinic keratosis: Secondary | ICD-10-CM

## 2024-04-18 DIAGNOSIS — L578 Other skin changes due to chronic exposure to nonionizing radiation: Secondary | ICD-10-CM | POA: Diagnosis not present

## 2024-04-18 DIAGNOSIS — L814 Other melanin hyperpigmentation: Secondary | ICD-10-CM

## 2024-04-18 DIAGNOSIS — D229 Melanocytic nevi, unspecified: Secondary | ICD-10-CM

## 2024-04-18 DIAGNOSIS — L503 Dermatographic urticaria: Secondary | ICD-10-CM

## 2024-04-18 DIAGNOSIS — Z85828 Personal history of other malignant neoplasm of skin: Secondary | ICD-10-CM

## 2024-04-18 DIAGNOSIS — W908XXA Exposure to other nonionizing radiation, initial encounter: Secondary | ICD-10-CM

## 2024-04-18 DIAGNOSIS — Z1283 Encounter for screening for malignant neoplasm of skin: Secondary | ICD-10-CM

## 2024-04-18 DIAGNOSIS — D1801 Hemangioma of skin and subcutaneous tissue: Secondary | ICD-10-CM

## 2024-04-18 DIAGNOSIS — Z86018 Personal history of other benign neoplasm: Secondary | ICD-10-CM

## 2024-04-18 DIAGNOSIS — L821 Other seborrheic keratosis: Secondary | ICD-10-CM

## 2024-04-18 NOTE — Patient Instructions (Addendum)

## 2024-04-18 NOTE — Progress Notes (Signed)
 Follow-Up Visit   Subjective  Holly Bennett is a 58 y.o. female who presents for the following: Skin Cancer Screening and Full Body Skin Exam  The patient presents for Total-Body Skin Exam (TBSE) for skin cancer screening and mole check. The patient has spots, moles and lesions to be evaluated, some may be new or changing. She has a spot to check on the left cheek and right med thigh.     The following portions of the chart were reviewed this encounter and updated as appropriate: medications, allergies, medical history  Review of Systems:  No other skin or systemic complaints except as noted in HPI or Assessment and Plan.  Objective  Well appearing patient in no apparent distress; mood and affect are within normal limits.  A full examination was performed including scalp, head, eyes, ears, nose, lips, neck, chest, axillae, abdomen, back, buttocks, bilateral upper extremities, bilateral lower extremities, hands, feet, fingers, toes, fingernails, and toenails. All findings within normal limits unless otherwise noted below.   Relevant physical exam findings are noted in the Assessment and Plan.  L lat cheek x 1, L hand dorsum x 1 (2) Pink scaly papules.   Assessment & Plan   SKIN CANCER SCREENING PERFORMED TODAY.  ACTINIC DAMAGE - Chronic condition, secondary to cumulative UV/sun exposure - diffuse scaly erythematous macules with underlying dyspigmentation - Recommend daily broad spectrum sunscreen SPF 30+ to sun-exposed areas, reapply every 2 hours as needed.  - Staying in the shade or wearing long sleeves, sun glasses (UVA+UVB protection) and wide brim hats (4-inch brim around the entire circumference of the hat) are also recommended for sun protection.  - Call for new or changing lesions.  LENTIGINES, SEBORRHEIC KERATOSES (including right med upper thigh), HEMANGIOMAS - Benign normal skin lesions - Benign-appearing - Call for any changes  MELANOCYTIC NEVI - Tan-brown and/or  pink-flesh-colored symmetric macules and papules - 6mm medium dark brown at right lateral breast macule, present for years without change  - 4 mm light tan papule at right mid plantar foot - Benign appearing on exam today - Observation - Call clinic for new or changing moles - Recommend daily use of broad spectrum spf 30+ sunscreen to sun-exposed areas.   HISTORY OF BASAL CELL CARCINOMA OF THE SKIN Left cheek inferior zygoma, Mohs 2018 - No evidence of recurrence today - Recommend regular full body skin exams - Recommend daily broad spectrum sunscreen SPF 30+ to sun-exposed areas, reapply every 2 hours as needed.  - Call if any new or changing lesions are noted between office visits  HISTORY OF DYSPLASTIC NEVUS Right upper abdomen, mod, 2023 No evidence of recurrence today Recommend regular full body skin exams Recommend daily broad spectrum sunscreen SPF 30+ to sun-exposed areas, reapply every 2 hours as needed.  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 Condition is bothersome/symptomatic for patient. Currently flared.   Urticaria or hives is a pink to red patchy whelp- like rash of the skin that typically itches and it is the result of histamine release in the skin.   Hives may have multiple causes including stress, medications, infections, and systemic illness.  Sometimes there is a family history of chronic urticaria.   Physical urticarias may be caused by pressure (dermatographism), heat, sun, cold, vibration.  Insect bites can cause papular  urticaria. It is often difficult to find the cause of generalized hives.  Statistically, 70% of the time a cause of generalized hives is not found.  Sometimes hives can spontaneously resolve. Other times hives can persist and when  it does, and no cause is found, and it has been at least 6 weeks since started, it is called chronic idiopathic urticaria. Antihistamines are the mainstay for treatment.  In severe cases Xolair injections may be used.   Treatment Plan: Discussed starting Zyrtec, Allegra, or Claritin daily if needed for itch.     DERMATOFIBROMA Exam: Firm pink/brown papulenodule with dimple sign at right post ankle  Treatment Plan: A dermatofibroma is a benign growth possibly related to trauma, such as an insect bite, cut from shaving, or inflamed acne-type bump.  Treatment options to remove include shave or excision with resulting scar and risk of recurrence.  Since benign-appearing and not bothersome, will observe for now.    AK (ACTINIC KERATOSIS) (2) L lat cheek x 1, L hand dorsum x 1 (2) Vs Inflamed SK  Actinic keratoses are precancerous spots that appear secondary to cumulative UV radiation exposure/sun exposure over time. They are chronic with expected duration over 1 year. A portion of actinic keratoses will progress to squamous cell carcinoma of the skin. It is not possible to reliably predict which spots will progress to skin cancer and so treatment is recommended to prevent development of skin cancer.  Recommend daily broad spectrum sunscreen SPF 30+ to sun-exposed areas, reapply every 2 hours as needed.  Recommend staying in the shade or wearing long sleeves, sun glasses (UVA+UVB protection) and wide brim hats (4-inch brim around the entire circumference of the hat). Call for new or changing lesions. Destruction of lesion - L lat cheek x 1, L hand dorsum x 1 (2)  Destruction method: cryotherapy   Informed consent: discussed and consent obtained   Lesion destroyed using liquid nitrogen: Yes   Region frozen until ice ball extended beyond lesion: Yes   Outcome: patient tolerated procedure well with no complications   Post-procedure details: wound care instructions given   Additional details:   Prior to procedure, discussed risks of blister formation, small wound, skin dyspigmentation, or rare scar following cryotherapy. Recommend Vaseline ointment to treated areas while healing.  Return in about 1 year (around 04/18/2025) for TBSE, Hx BCC, Hx Dysplastic Nevus.  IAndrea Kerns, CMA, am acting as scribe for Rexene Rattler, MD .   Documentation: I have reviewed the above documentation for accuracy and completeness, and I agree with the above.  Rexene Rattler, MD

## 2024-07-31 ENCOUNTER — Ambulatory Visit (INDEPENDENT_AMBULATORY_CARE_PROVIDER_SITE_OTHER): Payer: Self-pay | Admitting: Dermatology

## 2024-07-31 DIAGNOSIS — L988 Other specified disorders of the skin and subcutaneous tissue: Secondary | ICD-10-CM

## 2024-07-31 NOTE — Patient Instructions (Signed)

## 2024-07-31 NOTE — Progress Notes (Signed)
   Follow-Up Visit   Subjective  Holly Bennett is a 58 y.o. female who presents for the following: Botox for facial elastosis  The following portions of the chart were reviewed this encounter and updated as appropriate: medications, allergies, medical history  Review of Systems:  No other skin or systemic complaints except as noted in HPI or Assessment and Plan.  Objective  Well appearing patient in no apparent distress; mood and affect are within normal limits.  A focused examination was performed of the face.  Relevant physical exam findings are noted in the Assessment and Plan.  Injection map photo     Assessment & Plan    Facial Elastosis Botox 27.5 units injected today to: - Frown complex 20 units - Forehead 5 units - Botox comma 1.25 x 2 Location: frown complex, Botox commas, forehead  Informed consent: Discussed risks (infection, pain, bleeding, bruising, swelling, allergic reaction, paralysis of nearby muscles, eyelid droop, double vision, neck weakness, difficulty breathing, headache, undesirable cosmetic result, and need for additional treatment) and benefits of the procedure, as well as the alternatives.  Informed consent was obtained.  Preparation: The area was cleansed with alcohol.  Procedure Details:  Botox was injected into the dermis with a 30-gauge needle. Pressure applied to any bleeding. Ice packs offered for swelling.  Lot Number:  [id number] Expiration:  07/2026  Total Units Injected:  27.5  Plan: Tylenol  may be used for headache.  Allow 2 weeks before returning to clinic for additional dosing as needed. Patient will call for any problems.  Return for 3-67m Botox.  I, Grayce Saunas, RMA, am acting as scribe for Rexene Rattler, MD .   Documentation: I have reviewed the above documentation for accuracy and completeness, and I agree with the above.  Rexene Rattler, MD

## 2024-09-25 ENCOUNTER — Other Ambulatory Visit: Payer: Self-pay | Admitting: Obstetrics and Gynecology

## 2024-09-25 DIAGNOSIS — Z1231 Encounter for screening mammogram for malignant neoplasm of breast: Secondary | ICD-10-CM

## 2024-10-05 DIAGNOSIS — R053 Chronic cough: Secondary | ICD-10-CM | POA: Diagnosis not present

## 2024-10-09 ENCOUNTER — Other Ambulatory Visit (HOSPITAL_COMMUNITY)
Admission: RE | Admit: 2024-10-09 | Discharge: 2024-10-09 | Disposition: A | Discharge status: Home / Self Care | Source: Ambulatory Visit | Attending: Obstetrics and Gynecology | Admitting: Obstetrics and Gynecology

## 2024-10-09 ENCOUNTER — Encounter: Payer: Self-pay | Admitting: Obstetrics and Gynecology

## 2024-10-09 ENCOUNTER — Ambulatory Visit: Payer: BC Managed Care – PPO | Admitting: Obstetrics and Gynecology

## 2024-10-09 VITALS — BP 124/80 | HR 76 | Ht 64.5 in | Wt 167.0 lb

## 2024-10-09 DIAGNOSIS — E6609 Other obesity due to excess calories: Secondary | ICD-10-CM | POA: Diagnosis not present

## 2024-10-09 DIAGNOSIS — E785 Hyperlipidemia, unspecified: Secondary | ICD-10-CM | POA: Diagnosis not present

## 2024-10-09 DIAGNOSIS — Z170 Estrogen receptor positive status [ER+]: Secondary | ICD-10-CM

## 2024-10-09 DIAGNOSIS — C50011 Malignant neoplasm of nipple and areola, right female breast: Secondary | ICD-10-CM

## 2024-10-09 DIAGNOSIS — Z1331 Encounter for screening for depression: Secondary | ICD-10-CM

## 2024-10-09 DIAGNOSIS — C50012 Malignant neoplasm of nipple and areola, left female breast: Secondary | ICD-10-CM

## 2024-10-09 DIAGNOSIS — Z01419 Encounter for gynecological examination (general) (routine) without abnormal findings: Secondary | ICD-10-CM | POA: Insufficient documentation

## 2024-10-09 DIAGNOSIS — E559 Vitamin D deficiency, unspecified: Secondary | ICD-10-CM | POA: Diagnosis not present

## 2024-10-09 DIAGNOSIS — Z1211 Encounter for screening for malignant neoplasm of colon: Secondary | ICD-10-CM

## 2024-10-09 MED ORDER — TIRZEPATIDE-WEIGHT MANAGEMENT 2.5 MG/0.5ML ~~LOC~~ SOLN: 2.5000 mg | SUBCUTANEOUS | 4 refills | Status: AC

## 2024-10-09 NOTE — Progress Notes (Signed)
 58 y.o. y.o. female here for annual exam. Patient's last menstrual period was 03/31/2018.   [id number] Married.  Girls 58 yo and 57 yo (Getting married)   RP:  Established patient presenting for annual gyn exam    HPI:  Left breast Ca post Lumpectomy/Sentinel LN Bx on 11/17/2017 and Radiation Therapy. Had wound infection at scar.  Stopped Tamoxifen  x 03/2021.  No PMB x 02/2022 when a Sonohysto showed a combined endometrial thickness at 2.8 mm with no IU lesions except for a partially SM Fibroid.  No pelvic pain. No pain with IC.  Pap Atypia with atrophy/HPV HR Neg in 09/2021.  Pap reflex today. Breasts normal.  Mammo Neg 09/2023 repeat scheduled.  Urine/BMs normal.  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  Will f/u here for Fasting Health labs.  Colono 10/2018 repeat at 5 years. Dxa:  1/26 normal repeat in 2 years Frustrated with the weight gain and not being able to loose it with diet and exercise. Feels best at 140's and cannot past 160's in menopause  Body mass index is 28.22 kg/m.     10/09/2024    3:15 PM 10/07/2023    3:03 PM 10/09/2020    3:07 PM  Depression screen PHQ 2/9  Decreased Interest 0 0 0  Down, Depressed, Hopeless 0 0 0  PHQ - 2 Score 0 0 0    Blood pressure 124/80, pulse 76, height 5' 4.5 (1.638 m), weight 167 lb (75.8 kg), last menstrual period 03/31/2018, SpO2 96%.     Component Value Date/Time   DIAGPAP  10/07/2023 1529    - Negative for intraepithelial lesion or malignancy (NILM)   DIAGPAP  10/05/2022 1643    - Negative for intraepithelial lesion or malignancy (NILM)   DIAGPAP - Atrophic pattern with epithelial atypia (A) 09/30/2021 1516   HPVHIGH Negative 10/07/2023 1529   HPVHIGH Negative 09/30/2021 1516   ADEQPAP  10/07/2023 1529    Satisfactory for evaluation. The presence or absence of an   ADEQPAP  10/07/2023 1529    endocervical/transformation zone component cannot be determined because   ADEQPAP of atrophy. 10/07/2023 1529    GYN  HISTORY:    Component Value Date/Time   DIAGPAP  10/07/2023 1529    - Negative for intraepithelial lesion or malignancy (NILM)   DIAGPAP  10/05/2022 1643    - Negative for intraepithelial lesion or malignancy (NILM)   DIAGPAP - Atrophic pattern with epithelial atypia (A) 09/30/2021 1516   HPVHIGH Negative 10/07/2023 1529   HPVHIGH Negative 09/30/2021 1516   ADEQPAP  10/07/2023 1529    Satisfactory for evaluation. The presence or absence of an   ADEQPAP  10/07/2023 1529    endocervical/transformation zone component cannot be determined because   ADEQPAP of atrophy. 10/07/2023 1529    OB History  Gravida Para Term Preterm AB Living  4 2   2 2   SAB IAB Ectopic Multiple Live Births  2        # Outcome Date GA Lbr Len/2nd Weight Sex Type Anes PTL Lv  4 SAB           3 SAB           2 Para           1 Para             Past Medical History:  Diagnosis Date   Acute right flank pain 01/21/2011   Back pain 01/21/2011   Basal cell carcinoma 02/08/2017  left cheek inferior zygoma. Mohs with Dr. Bluford.   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 mention of complication    genital   History of kidney stones    passed twice   Personal history of other malignant neoplasm of skin 06/10/2017   Personal history of radiation therapy    LEFFT lumpectomy   PONV (postoperative nausea and vomiting)    Viral warts, unspecified     Past Surgical History:  Procedure Laterality Date   APPENDECTOMY  1983   AXILLARY SENTINEL NODE BIOPSY Left 11/17/2017   Procedure: AXILLARY SENTINEL NODE BIOPSY;  Surgeon: Desiderio Schanz, MD;  Location: [facility] ORS;  Service: General;  Laterality: Left;   BREAST BIOPSY Left 10/15/2017   INVASIVE MAMMARY  CARCINOMA   BREAST LUMPECTOMY Left 11/17/2017   INVASIVE MAMMARY CARCINOMA with radiation   BREAST LUMPECTOMY     BREAST LUMPECTOMY WITH NEEDLE LOCALIZATION Left 11/17/2017   Procedure: BREAST LUMPECTOMY WITH NEEDLE LOCALIZATION;  Surgeon: Desiderio Schanz, MD;  Location: [facility] ORS;  Service: General;  Laterality: Left;   COLONOSCOPY WITH PROPOFOL  N/A 11/04/2018   Procedure: COLONOSCOPY WITH PROPOFOL ;  Surgeon: Therisa Bi, MD;  Location: [facility] ENDOSCOPY;  Service: Gastroenterology;  Laterality: N/A;   DILATION AND CURETTAGE OF UTERUS     x4   MOHS SURGERY  2018   TONSILLECTOMY AND ADENOIDECTOMY     WISDOM TOOTH EXTRACTION      Medications Ordered Prior to Encounter[1]  Social History   Socioeconomic History   Marital status: Married    Spouse name: Not on file   Number of children: 2   Years of education: Not on file   Highest education level: Not on file  Occupational History   Occupation: Psychologist, Educational at [employer]  Tobacco Use   Smoking status: Never   Smokeless tobacco: Never  Vaping Use   Vaping status: Never Used  Substance and Sexual Activity   Alcohol use: Yes    Comment: Socially on weekends   Drug use: No   Sexual activity: Yes    Partners: Male    Birth control/protection: Post-menopausal    Comment: 1st intercourse- 18, partners- more than 5  Other Topics Concern   Not on file  Social History Narrative   Not on file   Social Drivers of Health   Tobacco Use: Low Risk (10/09/2024)   Patient History    Smoking Tobacco Use: Never    Smokeless Tobacco Use: Never    Passive Exposure: Not on file  Financial Resource Strain: Not on file  Food Insecurity: Not on file  Transportation Needs: Not on file  Physical Activity: Not on file  Stress: Not on file  Social Connections: Not on file  Intimate Partner Violence: Not on file  Depression (PHQ2-9): Low Risk (10/09/2024)   Depression (PHQ2-9)    PHQ-2 Score: 0  Alcohol Screen: Not on file  Housing: Not on file   Utilities: Not on file  Health Literacy: Not on file    Family History  Problem Relation Age of Onset   Breast cancer Mother 79       currently 60   Hypertension Father    Lung cancer Father 61  smoker; deceased 31   Alcohol abuse Father    Asthma Maternal Grandmother    Arthritis Maternal Grandmother    Heart disease Maternal Grandmother    Asthma Paternal Grandmother    Breast cancer Other        mat grandmother's sister   Depression Daughter      Allergies[2]    Patient's last menstrual period was Patient's last menstrual period was 03/31/2018.Holly Bennett            Review of Systems Alls systems reviewed and are negative.     Physical Exam Constitutional:      Appearance: Normal appearance.  Genitourinary:     Vulva and urethral meatus normal.     No lesions in the vagina.     Right Labia: No rash, lesions or skin changes.    Left Labia: No lesions, skin changes or rash.    No vaginal discharge or tenderness.     No vaginal prolapse present.    No vaginal atrophy present.     Right Adnexa: not tender, not palpable and no mass present.    Left Adnexa: not tender, not palpable and no mass present.    No cervical motion tenderness or discharge.     Uterus is not enlarged, tender or irregular.  Breasts:    Right: Normal.     Left: Normal.  HENT:     Head: Normocephalic.  Neck:     Thyroid : No thyroid  mass, thyromegaly or thyroid  tenderness.  Cardiovascular:     Rate and Rhythm: Normal rate and regular rhythm.     Heart sounds: Normal heart sounds, S1 normal and S2 normal.  Pulmonary:     Effort: Pulmonary effort is normal.     Breath sounds: Normal breath sounds and air entry.  Abdominal:     General: There is no distension.     Palpations: Abdomen is soft. There is no mass.     Tenderness: There is no abdominal tenderness. There is no guarding or rebound.  Musculoskeletal:        General: Normal range of motion.     Cervical back: Full passive range  of motion without pain, normal range of motion and neck supple. No tenderness.     Right lower leg: No edema.     Left lower leg: No edema.  Neurological:     Mental Status: She is alert.  Skin:    General: Skin is warm.  Psychiatric:        Mood and Affect: Mood normal.        Behavior: Behavior normal.        Thought Content: Thought content normal.  Vitals and nursing note reviewed. Exam conducted with a chaperone present.       A:         Well Woman GYN exam             Breast cancer  Desires weight loss with BMI >25 and is doing diet and exercise                P:        Pap smear collected today Encouraged annual mammogram screening Colon cancer screening referral placed today DXA up-to-date Labs and immunizations - here today Encouraged healthy lifestyle practices Encouraged Vit D and Calcium  Counseled on diet and exercise. To begin low dose zepbound . Would like to wait on colonoscopy -referral placed for her to use if she changes her mind. Dyspareunia, vaginal dryness:  discussed options with DHEA, intrarosa. Brochure given. Patient to let us  know if she would like to try. No follow-ups on file.  Holly Bennett      [1]  Current Outpatient Medications on File Prior to Visit  Medication Sig Dispense Refill   Ascorbic Acid (VITAMIN C) 500 MG tablet Take 500 mg by mouth daily.     Collagen Hydrolysate POWD by Does not apply route.     glucosamine-chondroitin 500-400 MG tablet Take 1 tablet by mouth in the morning and at bedtime.     Multiple Vitamin (MULTIVITAMIN) tablet Take 1 tablet by mouth daily.     predniSONE  (DELTASONE ) 20 MG tablet 3 po once a day for 2 days, then 2 po once a day for 3 days, then 1 po once a day for 3 days 15 tablet 0   vitamin B-12 (CYANOCOBALAMIN) 100 MCG tablet Take 100 mcg by mouth daily.     VITAMIN D  PO Take by mouth.     No current facility-administered medications on file prior to visit.  [2]  Allergies Allergen Reactions    Blue Dyes (Parenteral) Dermatitis   Penicillins Rash and Other (See Comments)    Has patient had a PCN reaction causing immediate rash, facial/tongue/throat swelling, SOB or lightheadedness with hypotension: Unknown Has patient had a PCN reaction causing severe rash involving mucus membranes or skin necrosis: No Has patient had a PCN reaction that required hospitalization: No Has patient had a PCN reaction occurring within the last 10 years: No If all of the above answers are NO, then may proceed with Cephalosporin use.

## 2024-10-10 ENCOUNTER — Ambulatory Visit: Payer: Self-pay | Admitting: Obstetrics and Gynecology

## 2024-10-10 LAB — LIPID PANEL
Cholesterol: 221 mg/dL — ABNORMAL HIGH (ref <200)
HDL: 78 mg/dL (ref >=50)
LDL Cholesterol (Calc): 120 mg/dL — ABNORMAL HIGH
Non-HDL Cholesterol (Calc): 143 mg/dL — ABNORMAL HIGH (ref <130)
Total CHOL/HDL Ratio: 2.8 (calc) (ref <5.0)
Triglycerides: 120 mg/dL (ref <150)

## 2024-10-10 LAB — HEMOGLOBIN A1C
Hgb A1c MFr Bld: 5.3 % (ref <5.7)
Mean Plasma Glucose: 105 mg/dL
eAG (mmol/L): 5.8 mmol/L

## 2024-10-10 LAB — COMPREHENSIVE METABOLIC PANEL WITH GFR
AG Ratio: 2.2 (calc) (ref 1.0–2.5)
ALT: 13 U/L (ref 6–29)
AST: 11 U/L (ref 10–35)
Albumin: 4.9 g/dL (ref 3.6–5.1)
Alkaline phosphatase (APISO): 110 U/L (ref 37–153)
BUN/Creatinine Ratio: 34 (calc) — ABNORMAL HIGH (ref 6–22)
BUN: 26 mg/dL — ABNORMAL HIGH (ref 7–25)
CO2: 29 mmol/L (ref 20–32)
Calcium: 10.1 mg/dL (ref 8.6–10.4)
Chloride: 103 mmol/L (ref 98–110)
Creat: 0.77 mg/dL (ref 0.50–1.03)
Globulin: 2.2 g/dL (ref 1.9–3.7)
Glucose, Bld: 83 mg/dL (ref 65–99)
Potassium: 4.9 mmol/L (ref 3.5–5.3)
Sodium: 141 mmol/L (ref 135–146)
Total Bilirubin: 0.3 mg/dL (ref 0.2–1.2)
Total Protein: 7.1 g/dL (ref 6.1–8.1)
eGFR: 89 mL/min/1.73m2 (ref >=60)

## 2024-10-10 LAB — CBC
HCT: 39.3 % (ref 35.9–46.0)
Hemoglobin: 13.1 g/dL (ref 11.7–15.5)
MCH: 28.8 pg (ref 27.0–33.0)
MCHC: 33.3 g/dL (ref 31.6–35.4)
MCV: 86.4 fL (ref 81.4–101.7)
MPV: 10.4 fL (ref 7.5–12.5)
Platelets: 317 Thousand/uL (ref 140–400)
RBC: 4.55 Million/uL (ref 3.80–5.10)
RDW: 12.5 % (ref 11.0–15.0)
WBC: 7.2 Thousand/uL (ref 3.8–10.8)

## 2024-10-10 LAB — VITAMIN D 25 HYDROXY (VIT D DEFICIENCY, FRACTURES): Vit D, 25-Hydroxy: 46 ng/mL (ref 30–100)

## 2024-10-10 LAB — TSH: TSH: 2.14 m[IU]/L (ref 0.40–4.50)

## 2024-10-16 LAB — CYTOLOGY - PAP: Diagnosis: NEGATIVE

## 2024-11-01 ENCOUNTER — Ambulatory Visit
Admission: RE | Admit: 2024-11-01 | Discharge: 2024-11-01 | Disposition: A | Discharge status: Home / Self Care | Source: Ambulatory Visit | Attending: Obstetrics and Gynecology | Admitting: Obstetrics and Gynecology

## 2024-11-01 DIAGNOSIS — Z1231 Encounter for screening mammogram for malignant neoplasm of breast: Secondary | ICD-10-CM | POA: Insufficient documentation

## 2024-11-06 ENCOUNTER — Ambulatory Visit: Payer: Self-pay | Admitting: Obstetrics and Gynecology

## 2025-01-29 ENCOUNTER — Ambulatory Visit: Admitting: Dermatology

## 2025-05-01 ENCOUNTER — Encounter: Admitting: Dermatology

## 2025-10-10 ENCOUNTER — Ambulatory Visit: Admitting: Obstetrics and Gynecology
